# Patient Record
Sex: Female | Born: 1935 | Race: White | Hispanic: No | Smoking: Never smoker
Health system: Southern US, Community
[De-identification: ages and names within clinical notes are randomized; demographics above are authoritative.]

## PROBLEM LIST (undated history)

## (undated) DIAGNOSIS — I10 Essential (primary) hypertension: Secondary | ICD-10-CM

## (undated) DIAGNOSIS — I219 Acute myocardial infarction, unspecified: Secondary | ICD-10-CM

## (undated) DIAGNOSIS — I4891 Unspecified atrial fibrillation: Secondary | ICD-10-CM

## (undated) DIAGNOSIS — M1990 Unspecified osteoarthritis, unspecified site: Secondary | ICD-10-CM

## (undated) DIAGNOSIS — F32A Depression, unspecified: Secondary | ICD-10-CM

## (undated) DIAGNOSIS — F329 Major depressive disorder, single episode, unspecified: Secondary | ICD-10-CM

## (undated) DIAGNOSIS — E039 Hypothyroidism, unspecified: Secondary | ICD-10-CM

## (undated) DIAGNOSIS — F419 Anxiety disorder, unspecified: Secondary | ICD-10-CM

## (undated) DIAGNOSIS — R251 Tremor, unspecified: Secondary | ICD-10-CM

## (undated) DIAGNOSIS — F0390 Unspecified dementia without behavioral disturbance: Secondary | ICD-10-CM

## (undated) DIAGNOSIS — J189 Pneumonia, unspecified organism: Secondary | ICD-10-CM

## (undated) DIAGNOSIS — D649 Anemia, unspecified: Secondary | ICD-10-CM

## (undated) DIAGNOSIS — S72002A Fracture of unspecified part of neck of left femur, initial encounter for closed fracture: Secondary | ICD-10-CM

## (undated) HISTORY — DX: Tremor, unspecified: R25.1

## (undated) HISTORY — DX: Unspecified dementia, unspecified severity, without behavioral disturbance, psychotic disturbance, mood disturbance, and anxiety: F03.90

## (undated) HISTORY — PX: ROTATOR CUFF REPAIR: SHX139

## (undated) HISTORY — PX: TOTAL HIP ARTHROPLASTY: SHX124

## (undated) HISTORY — DX: Essential (primary) hypertension: I10

## (undated) HISTORY — PX: CATARACT EXTRACTION, BILATERAL: SHX1313

## (undated) HISTORY — PX: CATARACT EXTRACTION: SUR2

## (undated) HISTORY — PX: OTHER SURGICAL HISTORY: SHX169

## (undated) HISTORY — DX: Anemia, unspecified: D64.9

## (undated) HISTORY — DX: Unspecified atrial fibrillation: I48.91

## (undated) HISTORY — DX: Anxiety disorder, unspecified: F41.9

## (undated) HISTORY — DX: Hypothyroidism, unspecified: E03.9

## (undated) HISTORY — PX: CLOSED REDUCTION HUMERUS FRACTURE: SHX985

## (undated) HISTORY — PX: APPENDECTOMY: SHX54

---

## 1998-03-07 ENCOUNTER — Other Ambulatory Visit: Admission: RE | Admit: 1998-03-07 | Discharge: 1998-03-07 | Payer: Self-pay | Admitting: Internal Medicine

## 1999-01-27 ENCOUNTER — Ambulatory Visit (HOSPITAL_COMMUNITY): Admission: RE | Admit: 1999-01-27 | Discharge: 1999-01-27 | Payer: Self-pay | Admitting: Neurosurgery

## 1999-01-27 ENCOUNTER — Encounter: Payer: Self-pay | Admitting: Neurosurgery

## 1999-01-30 ENCOUNTER — Encounter: Payer: Self-pay | Admitting: Neurosurgery

## 1999-01-31 ENCOUNTER — Inpatient Hospital Stay (HOSPITAL_COMMUNITY): Admission: RE | Admit: 1999-01-31 | Discharge: 1999-02-07 | Payer: Self-pay | Admitting: Neurosurgery

## 1999-02-07 ENCOUNTER — Inpatient Hospital Stay (HOSPITAL_COMMUNITY)
Admission: RE | Admit: 1999-02-07 | Discharge: 1999-02-13 | Payer: Self-pay | Admitting: Physical Medicine and Rehabilitation

## 1999-02-15 ENCOUNTER — Encounter: Admission: RE | Admit: 1999-02-15 | Discharge: 1999-05-16 | Payer: Self-pay

## 1999-06-19 ENCOUNTER — Other Ambulatory Visit: Admission: RE | Admit: 1999-06-19 | Discharge: 1999-06-19 | Payer: Self-pay | Admitting: Internal Medicine

## 2000-05-31 ENCOUNTER — Encounter: Payer: Self-pay | Admitting: Internal Medicine

## 2000-05-31 ENCOUNTER — Encounter: Admission: RE | Admit: 2000-05-31 | Discharge: 2000-05-31 | Payer: Self-pay | Admitting: Internal Medicine

## 2001-04-10 ENCOUNTER — Other Ambulatory Visit: Admission: RE | Admit: 2001-04-10 | Discharge: 2001-04-10 | Payer: Self-pay | Admitting: Internal Medicine

## 2001-04-22 ENCOUNTER — Encounter: Payer: Self-pay | Admitting: Internal Medicine

## 2001-04-22 ENCOUNTER — Encounter: Admission: RE | Admit: 2001-04-22 | Discharge: 2001-04-22 | Payer: Self-pay | Admitting: Internal Medicine

## 2001-04-29 ENCOUNTER — Encounter: Admission: RE | Admit: 2001-04-29 | Discharge: 2001-04-29 | Payer: Self-pay | Admitting: Internal Medicine

## 2001-04-29 ENCOUNTER — Encounter: Payer: Self-pay | Admitting: Internal Medicine

## 2001-06-02 ENCOUNTER — Encounter: Admission: RE | Admit: 2001-06-02 | Discharge: 2001-06-02 | Payer: Self-pay | Admitting: Internal Medicine

## 2001-06-02 ENCOUNTER — Encounter: Payer: Self-pay | Admitting: Internal Medicine

## 2002-05-28 ENCOUNTER — Encounter: Admission: RE | Admit: 2002-05-28 | Discharge: 2002-05-28 | Payer: Self-pay | Admitting: Internal Medicine

## 2002-05-28 ENCOUNTER — Encounter: Payer: Self-pay | Admitting: Internal Medicine

## 2003-06-15 ENCOUNTER — Encounter: Payer: Self-pay | Admitting: Internal Medicine

## 2003-06-15 ENCOUNTER — Encounter: Admission: RE | Admit: 2003-06-15 | Discharge: 2003-06-15 | Payer: Self-pay | Admitting: Internal Medicine

## 2004-06-20 ENCOUNTER — Encounter: Admission: RE | Admit: 2004-06-20 | Discharge: 2004-06-20 | Payer: Self-pay | Admitting: Internal Medicine

## 2005-01-22 ENCOUNTER — Inpatient Hospital Stay (HOSPITAL_COMMUNITY): Admission: EM | Admit: 2005-01-22 | Discharge: 2005-02-01 | Payer: Self-pay | Admitting: Emergency Medicine

## 2005-01-23 ENCOUNTER — Encounter (INDEPENDENT_AMBULATORY_CARE_PROVIDER_SITE_OTHER): Payer: Self-pay | Admitting: *Deleted

## 2005-07-23 ENCOUNTER — Encounter: Admission: RE | Admit: 2005-07-23 | Discharge: 2005-07-23 | Payer: Self-pay | Admitting: Internal Medicine

## 2005-08-04 ENCOUNTER — Encounter: Admission: RE | Admit: 2005-08-04 | Discharge: 2005-08-04 | Payer: Self-pay | Admitting: Internal Medicine

## 2005-10-26 ENCOUNTER — Encounter: Admission: RE | Admit: 2005-10-26 | Discharge: 2005-10-26 | Payer: Self-pay | Admitting: Internal Medicine

## 2005-11-08 ENCOUNTER — Encounter: Admission: RE | Admit: 2005-11-08 | Discharge: 2005-11-08 | Payer: Self-pay | Admitting: Internal Medicine

## 2005-11-22 ENCOUNTER — Encounter: Admission: RE | Admit: 2005-11-22 | Discharge: 2005-11-22 | Payer: Self-pay | Admitting: Internal Medicine

## 2005-12-05 ENCOUNTER — Encounter: Admission: RE | Admit: 2005-12-05 | Discharge: 2005-12-05 | Payer: Self-pay | Admitting: Internal Medicine

## 2006-06-04 ENCOUNTER — Encounter: Admission: RE | Admit: 2006-06-04 | Discharge: 2006-06-04 | Payer: Self-pay | Admitting: Internal Medicine

## 2006-06-25 ENCOUNTER — Encounter: Admission: RE | Admit: 2006-06-25 | Discharge: 2006-06-25 | Payer: Self-pay | Admitting: Internal Medicine

## 2006-07-18 ENCOUNTER — Encounter: Admission: RE | Admit: 2006-07-18 | Discharge: 2006-07-18 | Payer: Self-pay | Admitting: Internal Medicine

## 2006-09-02 ENCOUNTER — Encounter: Admission: RE | Admit: 2006-09-02 | Discharge: 2006-09-02 | Payer: Self-pay | Admitting: Internal Medicine

## 2006-11-17 ENCOUNTER — Inpatient Hospital Stay (HOSPITAL_COMMUNITY): Admission: EM | Admit: 2006-11-17 | Discharge: 2006-11-21 | Payer: Self-pay | Admitting: Emergency Medicine

## 2006-11-18 ENCOUNTER — Ambulatory Visit: Payer: Self-pay | Admitting: Physical Medicine & Rehabilitation

## 2006-12-02 ENCOUNTER — Encounter: Admission: RE | Admit: 2006-12-02 | Discharge: 2006-12-02 | Payer: Self-pay | Admitting: Orthopedic Surgery

## 2007-01-27 ENCOUNTER — Encounter: Admission: RE | Admit: 2007-01-27 | Discharge: 2007-01-27 | Payer: Self-pay | Admitting: Orthopedic Surgery

## 2007-09-26 IMAGING — US US EXTREM LOW VENOUS*L*
1 series · 14 of 24 positions shown · non-contrast
Comparison: None.

CLINICAL DATA: Status post left hip fracture with left lower extremity pain and swelling.
 LEFT LOWER EXTREMITY VENOUS DOPPLER ULTRASOUND:
TECHNIQUE: Gray scale sonography with compression, as well as color and duplex Doppler ultrasound, were performed to evaluate the deep venous system from the level of the common femoral vein through the popliteal and proximal calf veins.

[Series 1: unknown · 14 of 28 slices shown]
[im 1/28]
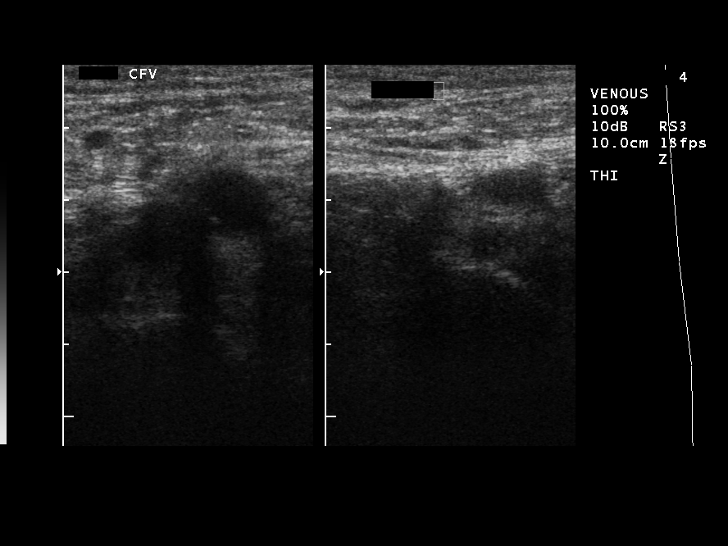
[im 3/28]
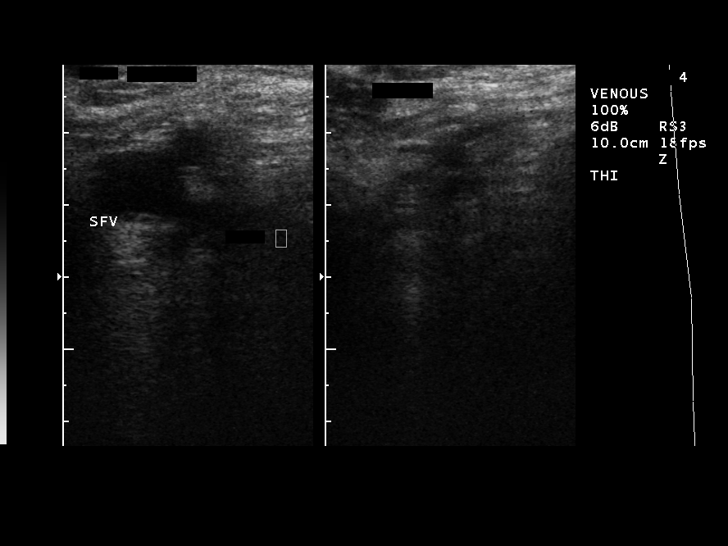
[im 5/28]
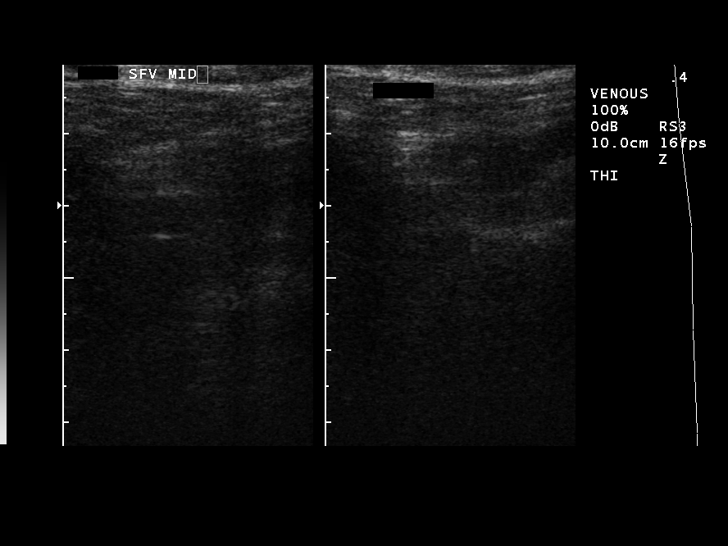
[im 8/28]
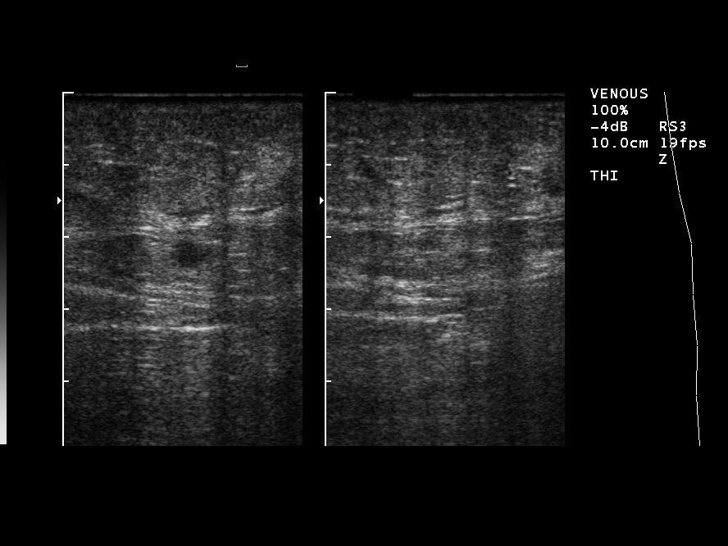
[im 9/28]
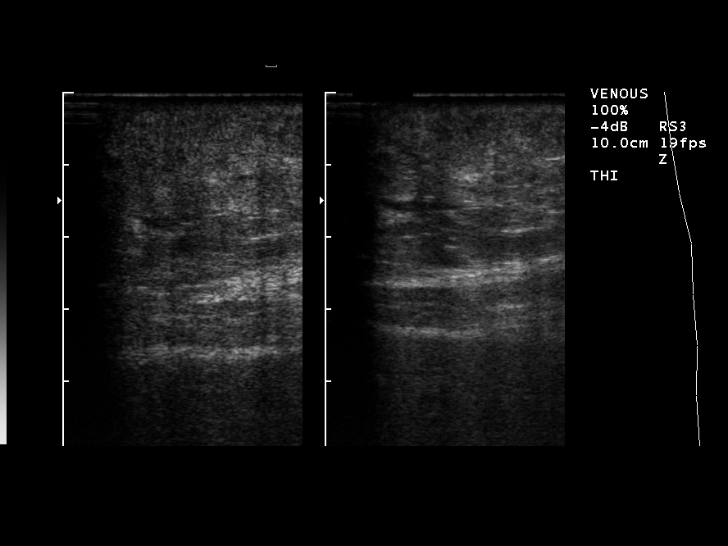
[im 11/28]
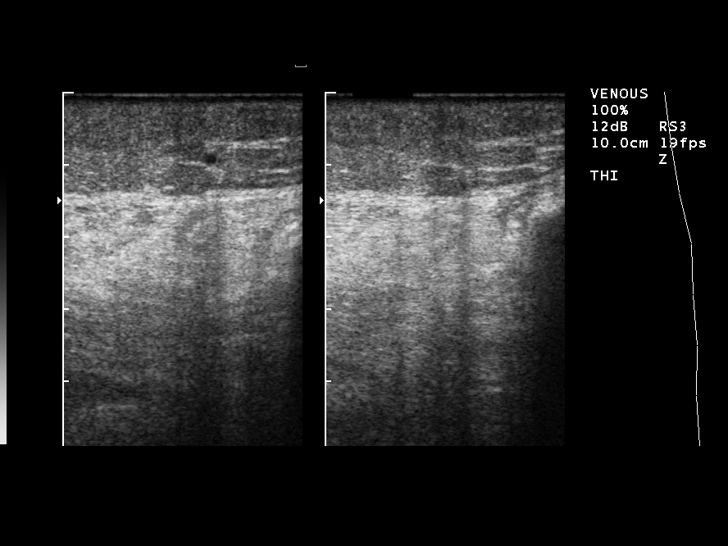
[im 13/28]
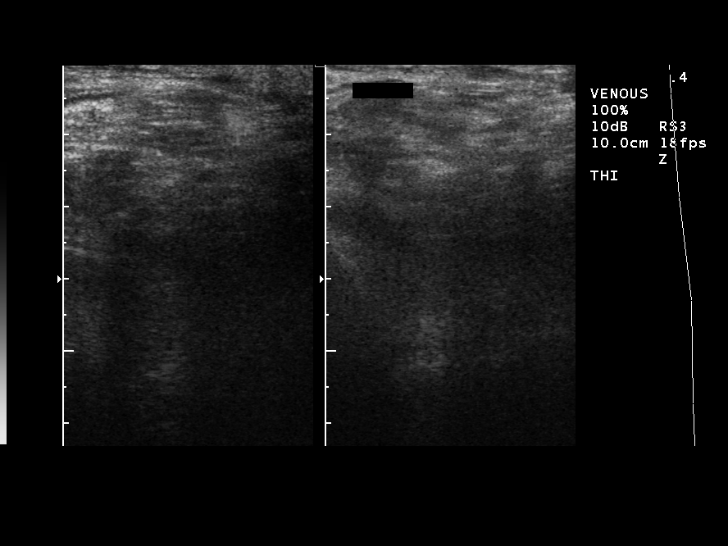
[im 15/28]
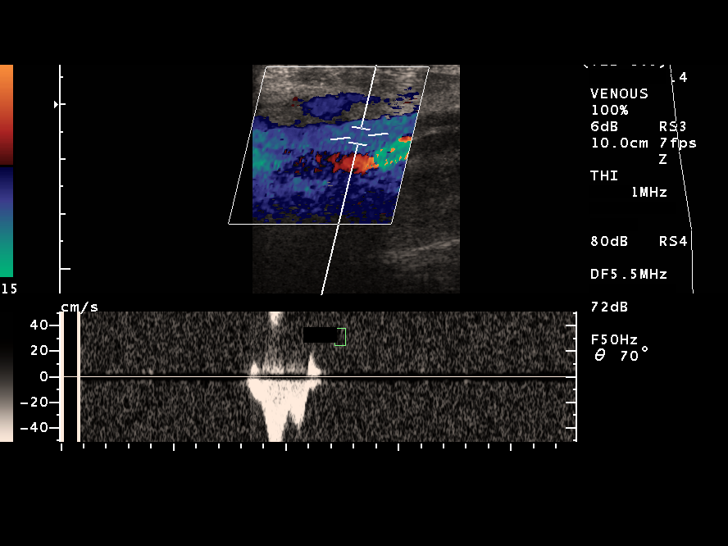
[im 17/28]
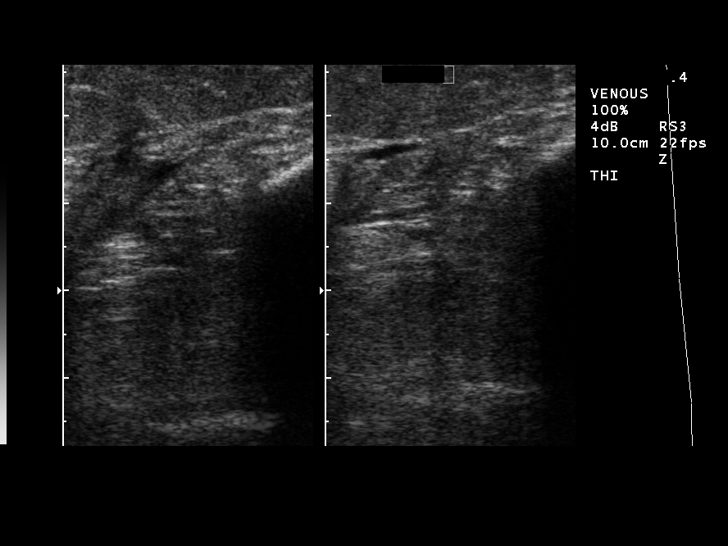
[im 19/28]
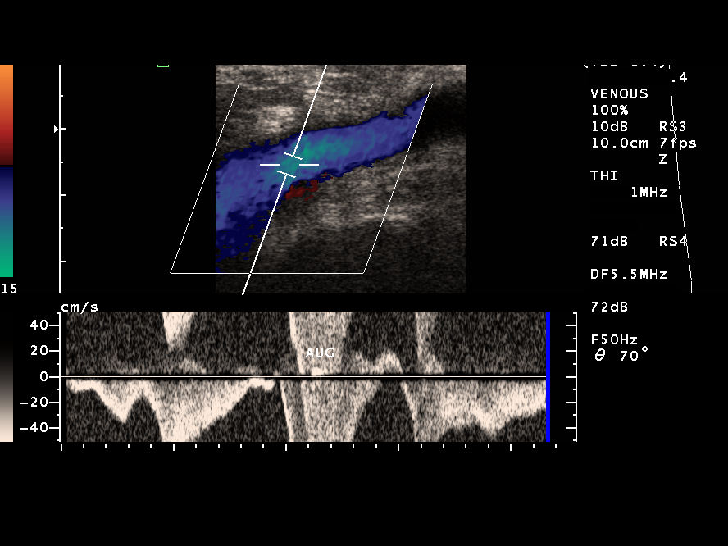
[im 22/28]
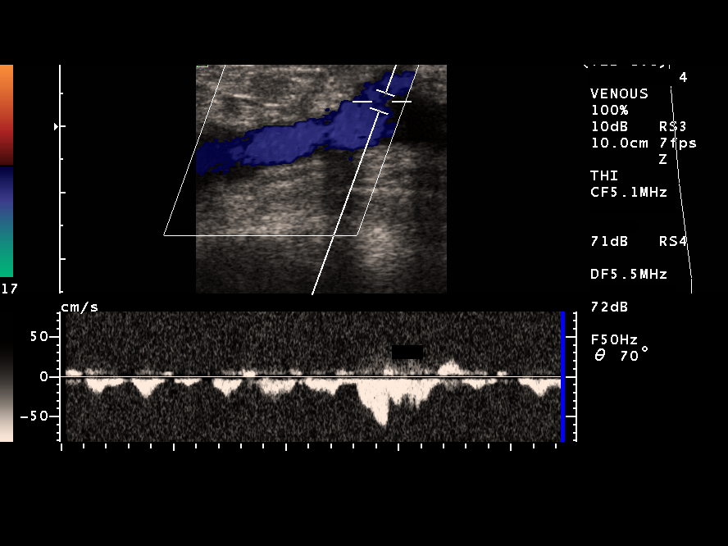
[im 23/28]
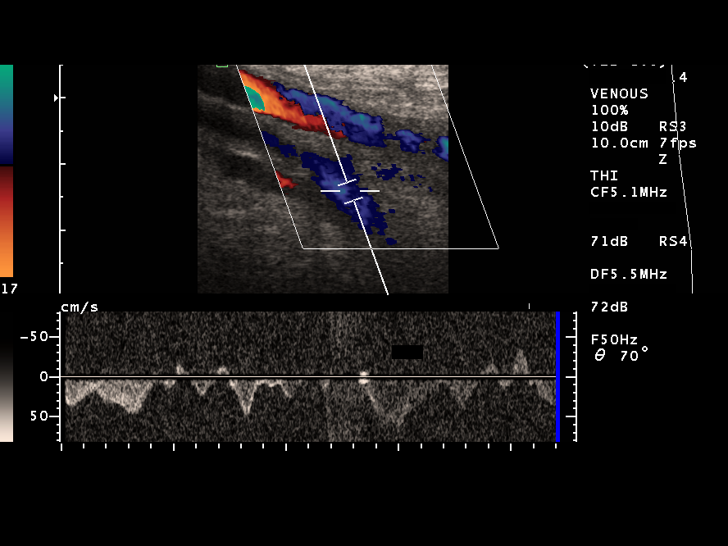
[im 25/28]
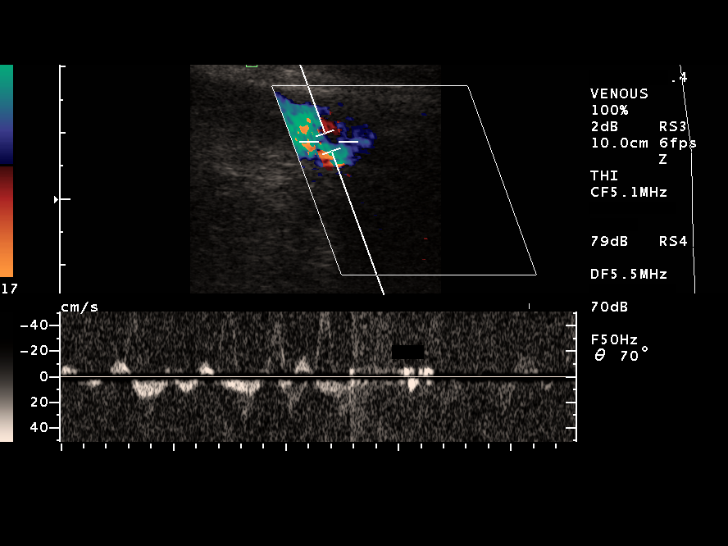
[im 28/28]
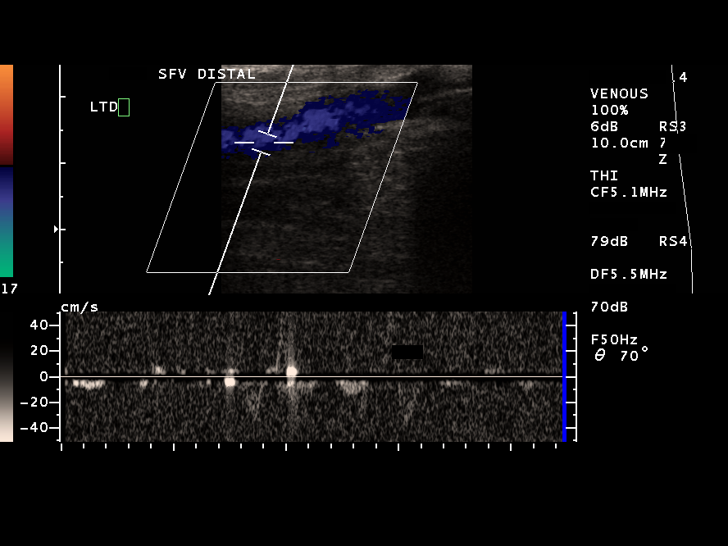

[14 of 24 positions shown; findings below may reference images not displayed]

FINDINGS: Complete compressibility is demonstrated throughout the visualized deep veins.  No venous filling defects are identified by gray scale or color Doppler sonography.  Doppler waveforms show normal direction of venous flow, with normal phasicity and response to augmentation.
 Portions of the exam were somewhat limited due to soft tissue edema.  Primary limitations involve the distal superficial femoral vein.
IMPRESSION: Negative.  No evidence of deep venous thrombosis.

## 2010-06-14 ENCOUNTER — Encounter: Admission: RE | Admit: 2010-06-14 | Discharge: 2010-06-14 | Payer: Self-pay | Admitting: Internal Medicine

## 2010-06-21 ENCOUNTER — Encounter: Admission: RE | Admit: 2010-06-21 | Discharge: 2010-06-21 | Payer: Self-pay | Admitting: Internal Medicine

## 2010-07-14 ENCOUNTER — Ambulatory Visit: Payer: Self-pay | Admitting: Cardiology

## 2010-07-27 ENCOUNTER — Ambulatory Visit: Payer: Self-pay | Admitting: Cardiology

## 2010-08-15 ENCOUNTER — Ambulatory Visit: Payer: Self-pay | Admitting: Cardiology

## 2010-09-14 ENCOUNTER — Ambulatory Visit: Payer: Self-pay | Admitting: Cardiology

## 2010-09-22 ENCOUNTER — Encounter: Admission: RE | Admit: 2010-09-22 | Discharge: 2010-09-22 | Payer: Self-pay | Admitting: Internal Medicine

## 2010-10-13 ENCOUNTER — Ambulatory Visit: Payer: Self-pay | Admitting: Cardiology

## 2010-10-16 ENCOUNTER — Ambulatory Visit: Payer: Self-pay | Admitting: Cardiovascular Disease

## 2010-11-14 ENCOUNTER — Ambulatory Visit: Payer: Self-pay | Admitting: Cardiology

## 2011-03-27 ENCOUNTER — Other Ambulatory Visit: Payer: Self-pay | Admitting: *Deleted

## 2011-03-27 DIAGNOSIS — R609 Edema, unspecified: Secondary | ICD-10-CM

## 2011-03-27 MED ORDER — FUROSEMIDE 20 MG PO TABS: ORAL_TABLET | ORAL | Status: DC

## 2011-04-13 NOTE — Discharge Summary (Signed)
[name], [name] NO.:  [id number]   MEDICAL RECORD NO.:  [id number]          PATIENT TYPE:  INP   LOCATION:  1410                         FACILITY:  [facility]   PHYSICIAN:  [name], D.O.    DATE OF BIRTH:  07-Jan-1922   DATE OF ADMISSION:  11/16/2006  DATE OF DISCHARGE:                               DISCHARGE SUMMARY   TRANSFER SUMMARY:  Date of transfer currently pending skilled nursing  facility bed availability.   PRIMARY CARE PHYSICIAN:  [name]. [name], M.D.   CARDIOLOGIST:  [name], M.D.   PRINCIPAL DIAGNOSES:  1. Status post hip fracture.  2. Anemia secondary to acute blood loss, status post anemia evaluation      this admission revealing no evidence of acute gastrointestinal      bleeding.  Her iron saturation was 8%, suggestive of iron      deficiency.  Her B12 was on the borderline.  3. History of atrial fibrillation though currently sinus rhythm,      status post direct current cardioversion in the past.  4. Thrombocytopenia, currently a platelet count of 99.  It has      remained stable during this hospitalization.  Would recommend      continuing to follow this in the outpatient setting.  Her B12 is      borderline low at 47 and will replete x1.  5. Coagulopathy.  She has required very little to achieve actually a      supratherapeutic INR.  After only receiving 5 mg on December 22 and      3 mg on December 23, her INR climbed to 3.9.  It is down to 3.0      today.  Pharmacy is adjusting her dose to 0.5 mg, which will await      final recommendations for Coumadin dosing prior to discharge.   DISCHARGE MEDICATIONS:  1. [name] should continue her atenolol at 25 mg daily as she was      on prior to admission.  2. Coumadin as per pharmacy protocol.  3. Calcium carbonate one tablet p.o. b.i.d.  4. Vitamin B12 100 mcg p.o. daily.  5. Colace 100 mg p.o. b.i.d., hold if she develops diarrhea.  6. Tylenol 650 mg q.4h. p.r.n.  pain and discomfort.  7. Oxycodone 5 mg p.o. q.6h. p.r.n. pain or discomfort.  8. Fleet's p.r.n.   DISPOSITION:  At present we are awaiting skilled nursing facility short-  term placement for case management arrangements.  Orthopedic surgery has  recommended touchdown weightbearing and to follow up with Dr. Carola Carr in  one week from today, November 20, 2006.   HISTORY OF PRESENTING ILLNESS:  For full details please refer to the H&P  as dictated by [name].  However, briefly, [name] is a very  pleasant 77 year old female patient of Dr. Lendell Carr, who presented after  she lost her balance and fell while folding laundry.  She had left hip  pain and x-rays in the emergency department revealed displacement of her  left hip with fracture, and she was seen by  orthopedic surgery and  scheduled for operative repair.   Carr COURSE:  [name] Carr course was uneventful.  She  underwent intramedullary nail placement on November 16, 2006.  She was  felt to have sustained acute blood loss, 300 mL of estimated blood loss,  and subsequently underwent a blood transfusion to a hemoglobin of 10.4.  She underwent anemia profile that revealed a low iron saturation with a  borderline low B12 and low folate levels.  She was replaced in reference  to her B12 as well as iron and underwent transfusion.  Her clinical  course has been that of improvement.  She has remained constipation;  however, she has exhibited no evidence of bloody stools.  Her hemoglobin  should be followed in the outpatient setting.   LABORATORY DATA AT TIME OF THIS DICTATION:  Sodium 134, potassium 3, BUN  10, creatinine 0.6.  WBC 6.3, hemoglobin 10.4, platelet count 99.      [name], D.O.  Electronically Signed     ESS/MEDQ  D:  11/20/2006  T:  11/20/2006  Job:  [id number]   cc:   [name]. [name]., M.D.  Fax: [phone]   [name]., M.D.  Fax: [phone]

## 2011-04-13 NOTE — Consult Note (Signed)
NAMENYRA, ANSPAUGH NO.:  [id number]   MEDICAL RECORD NO.:  [id number]          PATIENT TYPE:  EMS   LOCATION:  ED                           FACILITY:  [facility]   PHYSICIAN:  Doralee Albino. Carola Frost, M.D. DATE OF BIRTH:  1922-11-15   DATE OF CONSULTATION:  11/16/2006  DATE OF DISCHARGE:                                 CONSULTATION   REQUESTING PHYSICIAN:  Dr. Linwood Dibbles.   REASON FOR CONSULTATION:  Left hip pain and deformity.   BRIEF HISTORY OF PRESENTATION:  [name] is an 77 year old  white female who sustained a ground-level fall after getting dizzy  today.  She denies loss of consciousness, denies other injury or focal  pain.  After her fall, she noted left hip shortening and deformity and  was unable to bear weight.   PAST MEDICAL HISTORY:  Notable for atrial fibrillation status post  cardioversion back in March 2006 with sinus rhythm since that time.   FAMILY HISTORY:  Noncontributory.  No family history of coronary  disease, diabetes or anesthetic reactions.   SOCIAL HISTORY:  The patient does not smoke, does not drink alcohol and  lives alone independently.  She uses a cane occasionally.   DRUG ALLERGIES:  CODEINE which causes dizziness and MORPHINE which  causes of hyperactivity.  Family requests we not use morphine.   CURRENT MEDICATIONS:  Potassium, atenolol and baby aspirin.   REVIEW OF SYSTEMS:  Noncontributory as the patient denies recent GI or  GU complaints.  Other review of systems as noted above.   PHYSICAL EXAMINATION:  GENERAL:  She appears quite comfortable,  appropriate for stated age and not in any acute distress.  HEART:  Regular rate and rhythm.  LUNGS:  Clear bilaterally.  ABDOMEN:  Soft, nontender.  EXTREMITIES:  Notable for shortening of the left lower extremity with  tenderness and pain about the left hip.  There is no knee tenderness nor  swelling.  There is external rotation deformity.  Deep peroneal,  superficial  peroneal and tibial nerve sensory and motor function is  intact. Dorsalis pedis pulses 2+ and posterior tibial pulse is 1+.   X-RAYS:  AP and lateral hip films show a displaced shortened left  subtrochanteric hip fracture with extension well below the lesser  trochanter down into the shaft.   DATA:  EKG, chest x-ray, CBC and coags were reviewed along with her  chemistries.  These did not identify any significant abnormalities. Her  urinalysis was negative for infection.   IMPRESSION:  1. Left hip fracture.  2. History of arrhythmia and cardioversion.   PLAN:  Intramedullary nailing of the left hip later this evening.  The  patient has already been evaluated by the [facility] team with Dr.  Leanord Hawking and he plans to admit the patient.  We did discuss postoperative  management and he felt that a 24-hour stay in a monitored bed may be  beneficial and that if all was well after that time period that she  could be transferred to the orthopedic floor for rehabilitation.   I did discuss in detail  with the patient and her family the risks and  benefits of surgery as well as the intended procedure.  They understand  the possibility of heart attack, stroke, infection, death, neurovascular  injury, malunion, nonunion and need for further surgery including  revision, DVT, PE and others and they wish to proceed.      Doralee Albino. Carola Frost, M.D.  Electronically Signed     MHH/MEDQ  D:  11/16/2006  T:  11/18/2006  Job:  [id number]

## 2011-04-13 NOTE — Op Note (Signed)
NAMEDEMETRIS, MEINHARDT NO.:  [id number]   MEDICAL RECORD NO.:  [id number]          PATIENT TYPE:  OBV   LOCATION:  0104                         FACILITY:  [facility]   PHYSICIAN:  Doralee Albino. Carola Frost, M.D. DATE OF BIRTH:  1922/08/26   DATE OF PROCEDURE:  11/16/2006  DATE OF DISCHARGE:                               OPERATIVE REPORT   PREOPERATIVE DIAGNOSIS:  Left hip four-part intertrochanteric  subtrochanteric hip fracture.   POSTOPERATIVE DIAGNOSIS:  Left hip four-part intertrochanteric  subtrochanteric hip fracture.   PROCEDURE:  Intramedullary nailing of the left proximal femur using a  Gamma nail 125 degrees 380 mm statically locked.   SURGEON:  Myrene Galas, M.D.   ASSISTANT:  None.   ANESTHESIA:  General.   COMPLICATIONS:  None.   SPECIMENS:  None.   ESTIMATED BLOOD LOSS:  300 mL of reamings and fracture hematoma.   COMPLICATIONS:  None.   DISPOSITION:  PACU, condition stable.   BRIEF SUMMARY OF INDICATIONS FOR PROCEDURE:  [name] is an 77-year-  old female who sustained a left hip fracture in a ground-level fall.  We  discussed preoperatively risks and benefits of surgery including  possibility of infection, nerve injury, vessel injury, blood loss  requiring transfusion, malunion, nonunion, DVT, PE, heart attack, stroke  and other risks.  After full discussion she wished to proceed as did her  family.   DESCRIPTION OF PROCEDURE:  Ms. Knebel was taken to operating room  where general anesthesia was induced.  She was then transferred to the  fracture table where a reduction maneuver was performed after padding  all prominences.  We then made a 2.5 cm incision proximal to the greater  trochanter going down and attempting to start in the appropriate  position just off the greater trochanter.  Despite numerous attempts we  were unable to keep the guide wire and instrumentation from slipping  into the fracture site.  We did pass the guide wire  across the fracture  site after we had obtained a reduction which was done actually prior to  the prep and drape and make sure that we obtained a lateral to confirm  that the guide wire was as posterior as possible in the distal femur as  the radius curvature is often a concern with the nail tending to  translate anteriorly and which can lead to fracture of  the distal  femur.  We then sequentially reamed while holding the reaming channel  such that we did not destroy the lateral cortex or over-ream it.  We  then sunk the nail and used the nail within the femur to translate the  femoral shaft anteriorly and also to abduct it such that we were able to  get the appropriate trajectory of the guide pin into the center-center  position of the femoral head.  This was considerably difficult but we  were able to achieve satisfactory position and excellent overall  alignment with the exception of distraction at the fracture site which  was used to obtain an alignment.  The lag screw was then drilled and  inserted.  While watching on the lateral we could see the head did tend  to rotate just slightly with the lag screw.  Consequently the lag screw  was inserted a quarter turn too much and then backed up so that the neck  keyed in anatomically with regard to rotation.  We then released the  traction and engaged the compression device of the lag screw.  We were  able to watch on the lateral as this resulted in what appeared to be an  anatomic reduction with good compression at the fracture site.  A 90 mm  lag screw was placed.  We then placed one distal lock using a perfect  circle technique.  We then obtained of full length AP and lateral images  of the fracture site hardware and locking bolt.  This demonstrated  excellent reduction of the fracture and acceptable placement of  hardware.  Wounds were copiously irrigated and closed in standard  layered fashion using a #1 Vicryl for the deepest layer at  the hip, 2-0  for the subcu and staples for the skin.  Sterile gently compressive  dressing was applied.  The patient was awakened from anesthesia and  transported to PACU in stable condition.   PROGNOSIS:  Ms. Collyer has an axially unstable fracture of the  proximal femur and as such will not be able to progress with full  weightbearing.  Instead she will be touchdown weightbearing on the left  lower extremity for the next six weeks or so.  She will be on Coumadin  for DVT prophylaxis.  For next 24 hours she will be on a monitored bed  and if stable will be transferred to the orthopedic floor.  She will  continue on the primary medical service and we have asked both physical  and occupational therapy to assist [place] with her rehabilitation and also  will seek a rehab consult as the patient was living alone at the time of  injury.      Doralee Albino. Carola Frost, M.D.  Electronically Signed     MHH/MEDQ  D:  11/16/2006  T:  11/18/2006  Job:  [id number]

## 2011-04-13 NOTE — H&P (Signed)
NAMEBRANDILEE, PIES NO.:  [id number]   MEDICAL RECORD NO.:  [id number]          PATIENT TYPE:  INP   LOCATION:  1829                         FACILITY:  [facility]   PHYSICIAN:  Elmore Guise., M.D.DATE OF BIRTH:  07-10-1922   DATE OF ADMISSION:  01/22/2005  DATE OF DISCHARGE:                                HISTORY & PHYSICAL   REASON FOR ADMISSION:  Atrial fibrillation with rapid ventricular response.   PRIMARY CARE PHYSICIAN:  Dr. Lendell Caprice of [facility]   HISTORY OF PRESENT ILLNESS:  Patient is a very pleasant 77 year old white  female with past medical history of hypertension who presented to the ER for  evaluation of atrial fibrillation with RVR.  Patient in normal state of  health until approximately two weeks ago.  Since that time she reports two  falls, one where she hit her head on the table, a second fall where she  started having dizziness and fell on her left hip.  She reports that she has  just had a cold with malaise, nonproductive cough, as well as sore throat  and fever blister break-out on her lips.  She has recently started a muscle  relaxer as well as pain medicines to help with back and neck pain after her  first fall.  She stated that her falls are associated with mild dizziness,  however, no true loss of consciousness.  She denies any palpitations or  chest pain.  She does report mild pain on deep inspiration.  She has had  decreased p.o. intake for the past week only taking fluids in for the last  couple of days.  She denies any orthopnea, PND.  Does report off and on  lower extremity edema.  Her weight has been stable.  On arrival to the ER  patient was in atrial fibrillation with heart rate in the 170-180 range.  Her initial blood pressure was 80s/40s.  Patient was given a 500 mL normal  saline bolus with improvement in her blood pressure.  She was placed on  amiodarone drip with significant improvement in heart rate and blood  pressure.  Blood pressure now 110-120 range.   REVIEW OF SYSTEMS:  Otherwise negative.   CURRENT MEDICATIONS:  1.  Atenolol.  2.  K-Dur 20 mEq daily.   ALLERGIES:  None.   FAMILY HISTORY:  Positive for coronary disease in her brother and  hypertension in her other siblings.   SOCIAL HISTORY:  She is married.  No tobacco or alcohol (her husband is  currently in the hospital has been so since December 1).   PHYSICAL EXAMINATION:  VITAL SIGNS:  She is afebrile.  Blood pressure  114/60, pulse irregular irregular in the 120-130 range.  She is saturating  95% on room air.  GENERAL:  She is a very pleasant, elderly white female alert and oriented  x4, in no significant distress.  HEENT:  She does have an ecchymosis and small hematoma noted of the left  frontal area with ecchymosis extending to her medial portion of her left  eye.  No visual defect, though.  Oral mucosa is dry with herpetic lesions  noted on upper and lower lips.  LUNGS:  Clear.  HEART:  Irregularly irregular with a soft 2/6 systolic ejection murmur.  ABDOMEN:  Soft, nontender, nondistended.  EXTREMITIES:  Warm with 2+ pulses and no edema.  She does have a small  bruise noted on her left hip area.   EKG shows atrial fibrillation, rate of 159 per minute with nonspecific ST-T  wave changes.  Chest x-ray shows mild cardiomegaly with no significant  edema.  Left hip and pelvis films show no fracture.   LABORATORY VALUES:  Sodium 135, potassium 4.0, BUN 31, creatinine 0.9.  Hemoglobin 15.3.  Cardiac markers are pending.  TSH is pending.   IMPRESSION:  1.  Atrial fibrillation with rapid ventricular response of unknown duration.  2.  Multiple falls.  3.  Dehydration.  4.  Question whether her falls are secondary to combination of her atrial      fibrillation and her dehydration or secondary to her muscle relaxant and      newly added pain medication.   PLAN:  Will admit to telemetry bed.  Serial cardiac enzymes as well  as  checking her thyroid function.  Will continue her amiodarone drip secondary  to patient's low blood pressure on arrival.  Will give her a digoxin load.  She has normal renal function.  Continue to hydrate with normal saline 125  mL/hour.  Agree with continuing low dose beta blocker at 25 mg b.i.d.  Will  check routine surface echocardiogram to evaluate for LV chamber sizes as  well as LV function.  If heart rate continues to show no improvement,  discussed possibility of TEE cardioversion.  If she does become  hemodynamically unstable despite medical treatment, I did discuss urgent  cardioversion with both patient and family.  Further measures pending her  laboratory work.  Will start her on aspirin for anticoagulation at this  time.      TWK/MEDQ  D:  01/22/2005  T:  01/22/2005  Job:  [id number]

## 2011-04-13 NOTE — H&P (Signed)
[name], [name] NO.:  [id number]   MEDICAL RECORD NO.:  [id number]          PATIENT TYPE:  INP   LOCATION:  1410                         FACILITY:  [facility]   PHYSICIAN:  Maxwell Caul, M.D.DATE OF BIRTH:  1922/01/25   DATE OF ADMISSION:  11/16/2006  DATE OF DISCHARGE:                              HISTORY & PHYSICAL   IDENTIFICATION:  This is an 77 year old woman from home who is a primary  care patient of Dr. Higinio Carr followed in cardiology by Dr. Reyes Carr.   CHIEF COMPLAINT:  Fall at home suffering a left hip fracture.   HISTORY OF PRESENT ILLNESS:  This patient lost her balance and fell  today while folding laundry.  She noted immediate left hip pain, brought  to the emergency room.  X-ray shows displacement of trochanteric  proximal femur fracture.  She denies any history of syncope or chest  pain or palpitations surrounding this event.  She is planned for the OR  this afternoon.   PAST MEDICAL HISTORY:  1. Admitted to [facility] from February 7 through March 9 at      that time in atrial fibrillation with rapid ventricular response.      A TEE showed an EF of 50-55% without regional wall motion      abnormalities.  She required DC cardioversion.  She was discharged      on atenolol, amiodarone and Coumadin as she was maintained in      sinus.  I believe the amiodarone and Coumadin were since      discontinued.  She also during this admission had left lower lobe      pneumonia sick euthyroid picture.  2. Declining balance with falls:  She has had no falls in the last 3      months.  She uses a straight cane; however, I believe a walker has      been suggested to her in the past.  She has had 2 MRIs of the brain-      one in 2006 and one in 2007 most recently in October 2007 that      showed mild white matter changes.  She has had a prior occipital      craniotomy I believe for trigeminal neuralgia on the right.  This      was 10 years  ago.  3. Multiple epidural injections for lumbar spinal stenosis.  4. Craniotomy 10 years ago by her description for trigeminal      neuralgia.  5. I have been told in the past she has osteopenia with a history of      pelvic fracture some years ago.  This actually may fit the better      definition of osteoporosis.  6. Hypertension.   CURRENT MEDICATIONS:  1. Atenolol 25 daily.  2. Enteric-coated ASA 81 daily.  3. KCl unknown dose.   REVIEW OF SYSTEMS:  HEENT:  She has no headache.  RESPIRATORY:  No  exertional shortness of breath.  She is still active, still drives, does  not seem limited at all.  CARDIOVASCULAR:  No palpitations, no chest  pain, no suggestion of exertional chest discomfort.  Saw cardiology 3  months ago without any problems.   SOCIAL:  The patient is at home, uses a straight cane, still driving,  independent.   EXAMINATION:  Pulse is 62 when I saw her.  She is alert and responsive.  RESPIRATORY:  Clear air entry with no crackles or wheezes.  CARDIAC:  Heart sounds are normal.  There is no S4.  No carotid bruits  are noted.  No murmurs.  ABDOMEN:  Normal exam.  EXTREMITIES:  Pulses are intact.  She probably has mild venous stasis  but without any edema.  No evidence of a DVT.  CNS EXAM:  As near as I can tell is nonfocal at this point.  She has had  normal upper extremity strength, normal tone.  Reflexes in the upper  extremities are normal.  Both toes are downgoing.   LABORATORY:  Her EKG shows nonspecific T-wave changes.  Chest x-ray  shows no acute.  Her hemoglobin is 13.2, white count 4.13.  PT and PTT  are normal.  Her basic metabolic panel shows a potassium of 4.7, BUN of  15, creatinine 1.6.  Her glucose is 152.  Her urinalysis is clear.   IMPRESSION:  1. Left hip fracture for the operating room this afternoon:  She is an      elderly woman with hypertension.  However, otherwise there is no      suggestion of an unstable coronary syndrome which  would preclude      necessary surgery for this elderly woman.  2. History of atrial fibrillation:  She is currently sinus in the low      60s.  There is no suggestion of coronary artery disease.  She is      already on a beta blocker.  I will not add any further beta      blockade due to her low heart rate.  The TEE in 2006 showed no      valve problems, no regional wall abnormalities and a low normal      ejection fraction.  3. Gait ataxia with falls:  Apparently this has been thoroughly worked      up and the cause is not clear.  A walker has been suggested to her      in the past but she refuses.  4. Lumbar spinal stenosis having received prior epidural injections.  5. Osteopenia/osteoporosis:  This will likely need treatment.  6. History of sick euthyroid:  I will check a TSH and free T4.  7. Hypertension.   Overall impression is the patient is stable to proceed to surgery.  Her  risk is intermediate for coronary events; however, there is nothing  further to be done here and she should proceed with necessary surgery.  We will continue with beta blockers postoperative.  She will need  postoperative anticoagulation per protocol.  The risk of postoperative  delirium here is probably fairly high.           ______________________________  Maxwell Caul, M.D.     MGR/MEDQ  D:  11/16/2006  T:  11/18/2006  Job:  [id number]

## 2011-04-13 NOTE — Discharge Summary (Signed)
[name], [name] NO.:  [id number]   MEDICAL RECORD NO.:  [id number]          PATIENT TYPE:  INP   LOCATION:  2019                         FACILITY:  [facility]   PHYSICIAN:  Elmore Guise., M.D.DATE OF BIRTH:  July 26, 1922   DATE OF ADMISSION:  01/22/2005  DATE OF DISCHARGE:  02/01/2005                                 DISCHARGE SUMMARY   DISCHARGE DIAGNOSIS:  1.  Atrial fibrillation with rapid ventricular response status post DC      cardioversion with maintenance of normal sinus rhythm for the last eight      days.  2.  Left lower lobe pneumonia.  3.  Urinary tract infection.  4.  Dehydration, now resolved.  5.  Hypertension.  6.  Deconditioning.   HISTORY OF PRESENT ILLNESS:  The patient is a very pleasant 77 year old  white female with past medical history of hypertension who presented to Dr.  Lendell Caprice at [facility] after two falls over a one week period. On  arrival there, patient was found to have a tachy arrhythmia and be in new-  onset atrial fibrillation. She was then sent to the hospital for further  evaluation.   HOSPITAL COURSE:  On arrival to the hospital, the patient had borderline  blood pressures in the high 80s to low 90 range. She was started on  amiodarone drip as well as digoxin and IV load with some improvement and her  heart rate from the 160-170 range down to the 120 range. She underwent TEE  as well as DC cardioversion during her hospitalization. She tolerated these  procedures well. After her cardioversion, she had no further atrial  arrhythmias. She was maintained on amiodarone 200 mg daily as well as  atenolol and Coumadin. Her INR has now been therapeutic for the last week.  Prolonging her hospitalization, the patient was noted to have a left lower  lobe pneumonia. She was treated with azithromycin 500 mg daily for seven  days and this was decreased down to 250 mg for the next three days. She also  was found to have a  urinary tract infection and this was treated with Cipro  250 mg b.i.d. for three days. Her TSH was abnormal during her  hospitalization, however, her thyroid panel was consistent with a sick  euthyroid picture.  This will need to be followed up as an outpatient. The  patient has now been evaluated and performing physical therapy and  occupational therapy daily. Her strength is returning back to normal. She  states she is ready to go home with continued PT, OT and home health  evaluation for her. She will be discharged home on the following  medications.   DISCHARGE MEDICATIONS:  1.  Aspirin 81 mg daily.  2.  Amiodarone 200 mg daily.  3.  Coumadin 1 mg every other day to be again on February 04, 2005.  4.  Humibid LA 600 mg q.12h. p.r.n. cough.  5.  K-Dur 20 mEq p.o. daily  This is as before.  6.  Azithromycin 350 mg daily for the next three days.  7.  Atenolol 25 mg daily as before.   My goal at this time is to continue her Coumadin for treatment of four  weeks. Should she remain in sinus, at that time we will discontinue her  Coumadin therapy. However, we will continue amiodarone for a total of eight  weeks after hospitalization.  If she remains in normal sinus rhythm at that  time, we will also discontinue this treatment.   For pain, I have asked her to use Tylenol as needed.   Activity is as she can tolerate.  It was recommended that she have a rolling  walker for ambulation at home. This was arranged for her. She will also have  PT, OT and home health evaluations.   Her diet is as tolerated.   WOUND CARE:  She does have two small skin tears on her posterior thoracic  area which is presumed secondary to her cardioversion and removing of her  cardioversion pads.   The patient will follow-up with Dr. Reyes Ivan in [facility] in one  week for office visit and PT/INR. She will call the office at [phone] for  an appointment. Home health was arranged prior to her discharge.  She will  follow up with her primary care physician, Dr. Lendell Caprice, in approximately  four to six weeks.      TWK/MEDQ  D:  02/01/2005  T:  02/01/2005  Job:  [id number]   cc:   Dr. [facility]

## 2011-05-28 ENCOUNTER — Telehealth: Payer: Self-pay | Admitting: Cardiology

## 2011-05-28 NOTE — Telephone Encounter (Signed)
Wants to discuss who his mother's new provider is.Former Dr.Tennant patient.

## 2011-05-28 NOTE — Telephone Encounter (Signed)
Pt being sent to Dr Antoine Poche for cardiac follow-up. Left message for Chrissie Noa at [phone]. Left  [facility] phone number [phone]. Also left this office number for further questions. [phone]. Son Chrissie Noa returned call and informed of the above. Recall entered in to computer for Dr Antoine Poche.

## 2011-07-12 ENCOUNTER — Encounter: Payer: Self-pay | Admitting: *Deleted

## 2011-07-12 ENCOUNTER — Encounter: Payer: Self-pay | Admitting: Cardiology

## 2011-07-13 ENCOUNTER — Ambulatory Visit (INDEPENDENT_AMBULATORY_CARE_PROVIDER_SITE_OTHER): Payer: Medicare Other | Admitting: Cardiology

## 2011-07-13 ENCOUNTER — Encounter: Payer: Self-pay | Admitting: Cardiology

## 2011-07-13 VITALS — BP 98/62 | HR 77 | Ht 64.0 in | Wt 114.0 lb

## 2011-07-13 DIAGNOSIS — I4891 Unspecified atrial fibrillation: Secondary | ICD-10-CM | POA: Insufficient documentation

## 2011-07-13 DIAGNOSIS — I10 Essential (primary) hypertension: Secondary | ICD-10-CM | POA: Insufficient documentation

## 2011-07-13 LAB — CBC WITH DIFFERENTIAL/PLATELET
Basophils Absolute: 0 10*3/uL (ref 0.0–0.1)
Basophils Relative: 0.2 % (ref 0.0–3.0)
Eosinophils Absolute: 0 10*3/uL (ref 0.0–0.7)
Eosinophils Relative: 0.3 % (ref 0.0–5.0)
Hemoglobin: 15.2 g/dL — ABNORMAL HIGH (ref 12.0–15.0)
Lymphs Abs: 1.5 10*3/uL (ref 0.7–4.0)
Monocytes Absolute: 0.4 10*3/uL (ref 0.1–1.0)
Neutro Abs: 2.7 10*3/uL (ref 1.4–7.7)
Neutrophils Relative %: 57.7 % (ref 43.0–77.0)
WBC: 4.7 10*3/uL (ref 4.5–10.5)

## 2011-07-13 MED ORDER — WARFARIN SODIUM 2.5 MG PO TABS: 2.5000 mg | ORAL_TABLET | Freq: Every day | ORAL | Status: DC

## 2011-07-13 NOTE — Patient Instructions (Addendum)
Please re-start Coumadin 2.5 mg a day.  Stop Asprin  Continue all other medications the same.  Please have INR checked here at our office on Tuesday.  Have blood work today. (CBC)  Follow up in 2 months.

## 2011-07-13 NOTE — Assessment & Plan Note (Signed)
I had a long discussion with the patient and her daughter today. She does not have a significant contraindication to Coumadin. However, she has significant cardioembolic risk for CVA. Therefore, Coumadin would be indicated. She and her daughter seem to understand the risks benefits and would agree to continuing back on Coumadin. I will check a CBC and restart this medication. She will have followup in our clinic.

## 2011-07-13 NOTE — Assessment & Plan Note (Signed)
The blood pressure is at target. No change in medications is indicated. We will continue with therapeutic lifestyle changes (TLC).  

## 2011-07-13 NOTE — Progress Notes (Signed)
HPI The patient presents for follow up of atrial fibrillaiton.  She has been followed by Dr. Deborah Chalk.  Because of a previous fall she had been taken off of Coumadin. However , she is not having frequent falls currently. She lives by herself but has an aid who checks on her. She has dementia which is apparently significant but her meds or arranged for her. She denies any chest pressure, neck or arm discomfort. She doesn't notice any palpitations and has no presyncope or syncope. She's had no weight gain or edema.  No Known Allergies  Current Outpatient Prescriptions  Medication Sig Dispense Refill  . aspirin 81 MG tablet Take 81 mg by mouth daily.        Marland Kitchen atenolol (TENORMIN) 25 MG tablet Take 25 mg by mouth 2 (two) times daily.        . calcium-vitamin D (OSCAL WITH D) 500-200 MG-UNIT per tablet Take 1 tablet by mouth daily.        . digoxin (LANOXIN) 0.125 MG tablet Take 1 tablet by mouth daily.      . ferrous sulfate 325 (65 FE) MG tablet Take 325 mg by mouth 2 (two) times daily.        . furosemide (LASIX) 20 MG tablet ONE TABLET 5 X WEEKLY OR AS DIRECTED.  30 tablet  6  . levothyroxine (SYNTHROID, LEVOTHROID) 50 MCG tablet Take 1 tablet by mouth daily.      . Multiple Vitamin (MULTIVITAMIN) tablet Take 1 tablet by mouth daily.        . vitamin D, CHOLECALCIFEROL, 400 UNITS tablet Take 400 Units by mouth daily.          Past Medical History  Diagnosis Date  . Anxiety   . HTN (hypertension)   . Atrial fibrillation   . Dementia     Past Surgical History  Procedure Date  . Appendectomy   . Head surgery     nerves  . Total hip arthroplasty     ROS:  As stated in the HPI and negative for all other systems.  PHYSICAL EXAM BP 98/62  Pulse 77  Ht 5\' 4"  (1.626 m)  Wt 114 lb (51.71 kg)  BMI 19.57 kg/m2 GENERAL:  Well appearing HEENT:  Pupils equal round and reactive, fundi not visualized, oral mucosa unremarkable NECK:  No jugular venous distention, waveform within normal limits,  carotid upstroke brisk and symmetric, no bruits, no thyromegaly LYMPHATICS:  No cervical, inguinal adenopathy LUNGS:  Clear to auscultation bilaterally BACK:  No CVA tenderness CHEST:  Unremarkable HEART:  PMI not displaced or sustained,S1 and S2 within normal limits, no S3, irregular, no clicks, no rubs, no murmurs ABD:  Flat, positive bowel sounds normal in frequency in pitch, no bruits, no rebound, no guarding, no midline pulsatile mass, no hepatomegaly, no splenomegaly EXT:  2 plus pulses throughout, no edema, no cyanosis no clubbing SKIN:  No rashes no nodules NEURO:  Cranial nerves II through XII grossly intact, motor grossly intact throughout PSYCH:  Cognitively intact, oriented to person place and time  EKG: Atrial fibrillation, rate Rate 77, ST T wave changes consistent with dig effect.  ASSESSMENT AND PLAN

## 2011-07-17 ENCOUNTER — Other Ambulatory Visit: Payer: Self-pay

## 2011-07-17 ENCOUNTER — Ambulatory Visit (INDEPENDENT_AMBULATORY_CARE_PROVIDER_SITE_OTHER): Payer: Medicare Other | Admitting: *Deleted

## 2011-07-17 DIAGNOSIS — Z7901 Long term (current) use of anticoagulants: Secondary | ICD-10-CM

## 2011-07-17 DIAGNOSIS — I4891 Unspecified atrial fibrillation: Secondary | ICD-10-CM

## 2011-07-17 IMAGING — CT CT CHEST W/ CM
3 series · 16 of 30 positions shown, 18 images · IV contrast (agent unspecified)
Comparison: Abdomen pelvis CT 06/21/2010

CLINICAL DATA: Left pleural effusion.  Left mid back pain and
tenderness.  Fell 1 week ago.

CT CHEST WITH CONTRAST
TECHNIQUE: Multidetector CT imaging of the chest was performed
following the standard protocol during bolus administration of
intravenous contrast.
Contrast: 75 ml Fmnipaque-ARR

[Series 2: routine chest · axial · 0.62mm/px · z∈[-208,-58]mm · 5 of 50 slices shown, 7 images]
[im 10/50  mediastinal]
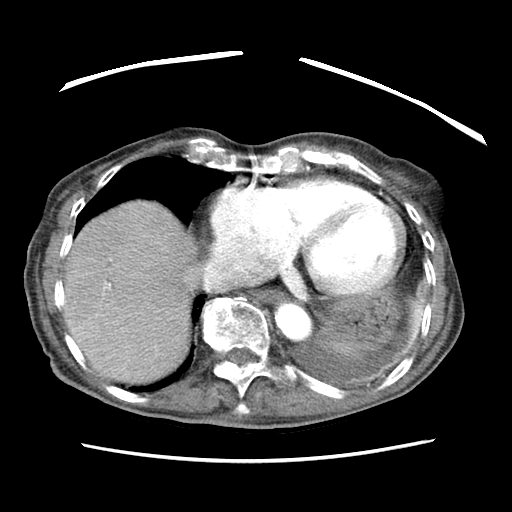
[im 10/50  lung]
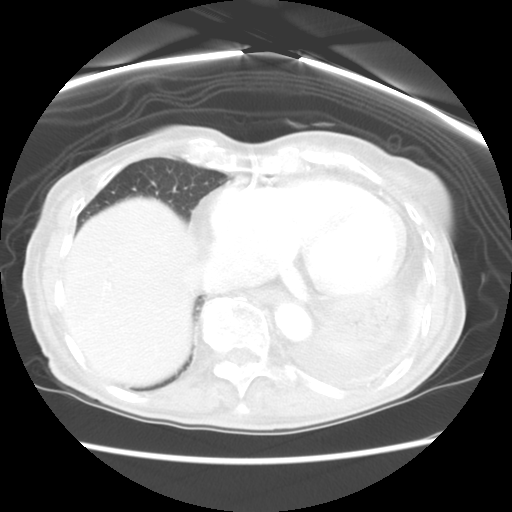
[im 20/50  lung]
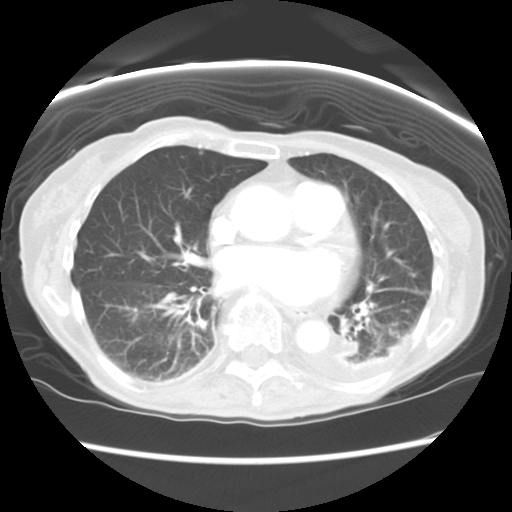
[im 28/50  lung]
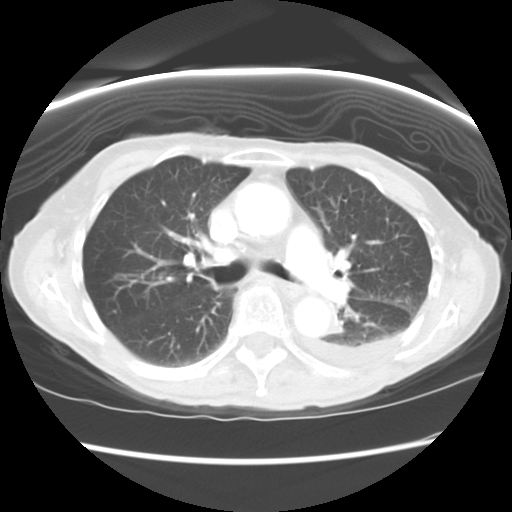
[im 30/50  lung]
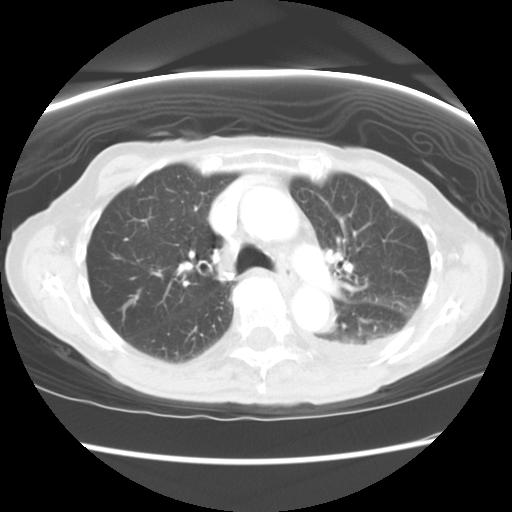
[im 40/50  mediastinal]
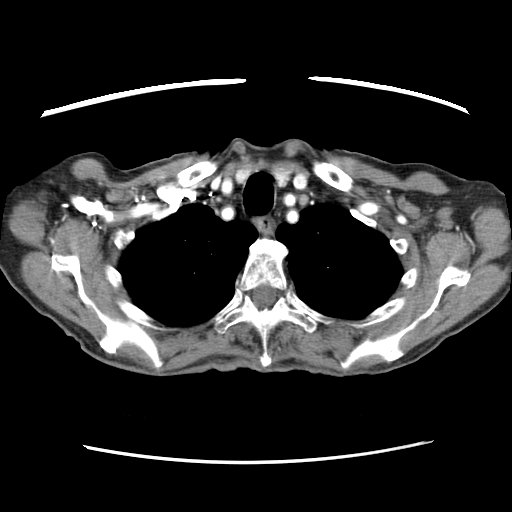
[im 40/50  lung]
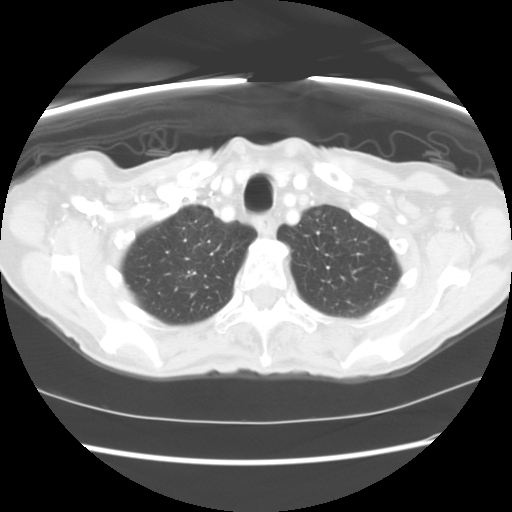

[Series 400: sagittal · sagittal · 0.62mm/px · 8 of 120 slices shown]
[im 10/120  lung]
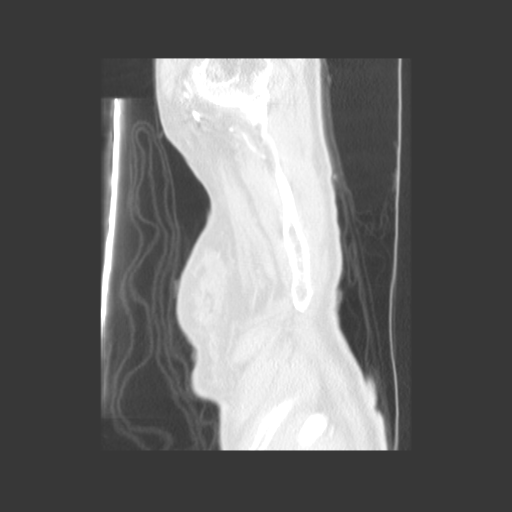
[im 28/120  lung]
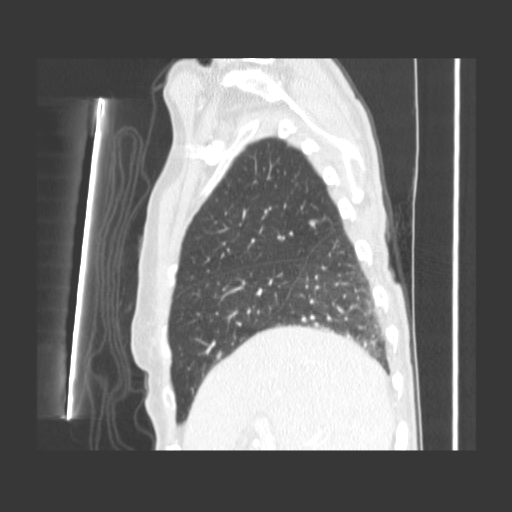
[im 46/120  lung]
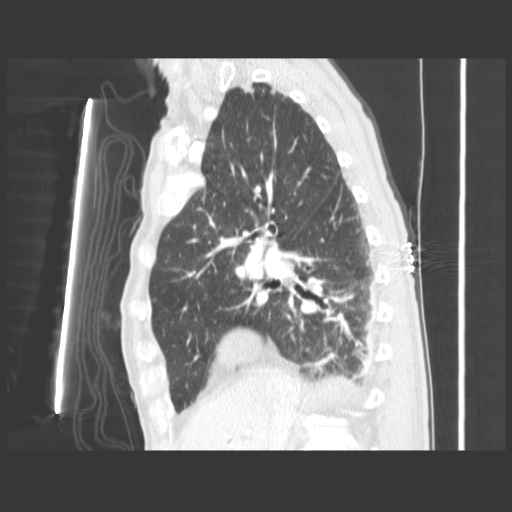
[im 55/120  lung]
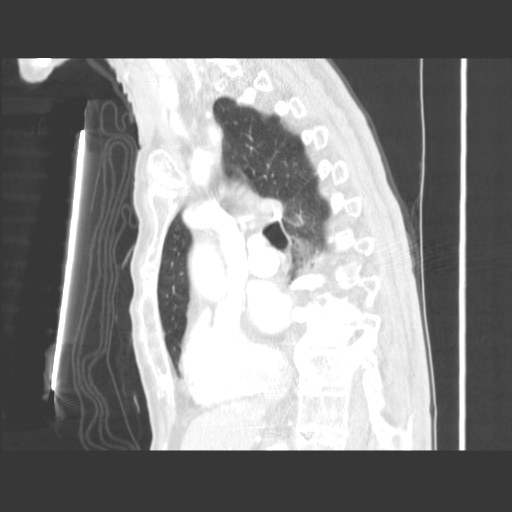
[im 65/120  lung]
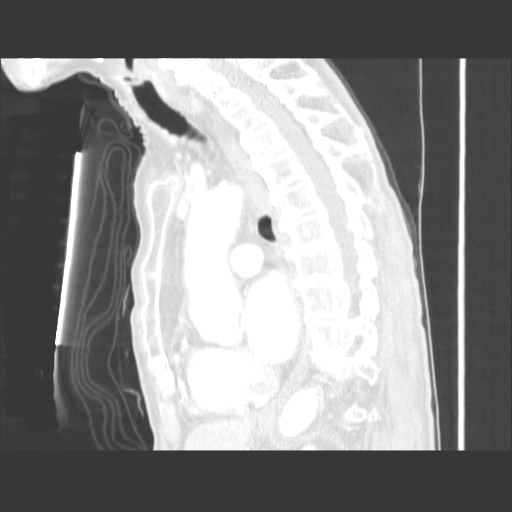
[im 74/120  lung]
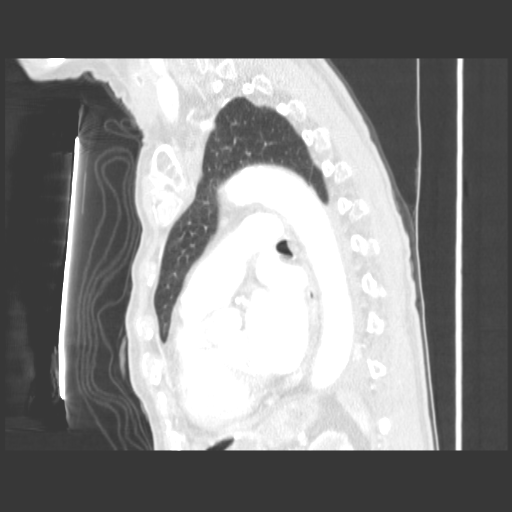
[im 92/120  lung]
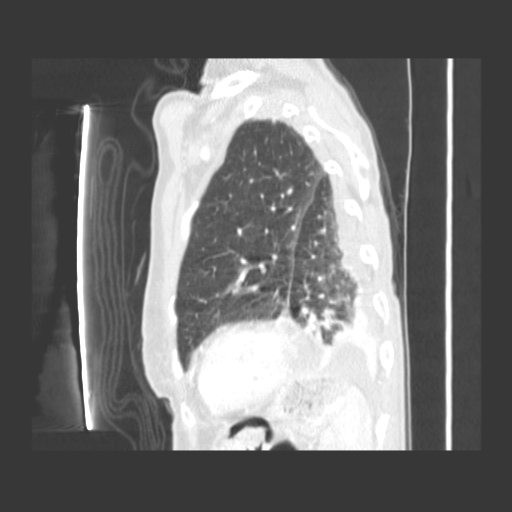
[im 110/120  lung]
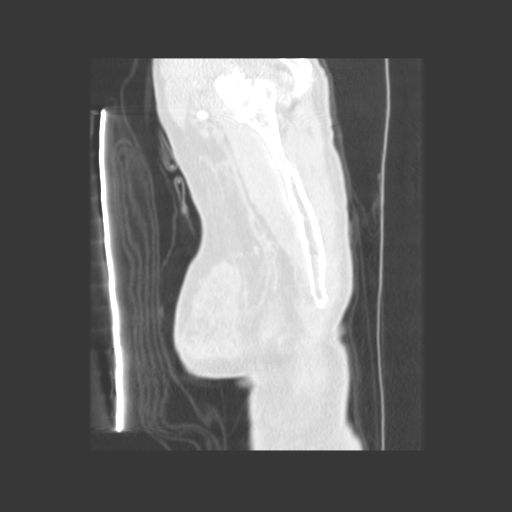

[Series 401: coronal · coronal · 0.62mm/px · 3 of 73 slices shown]
[im 11/73  lung]
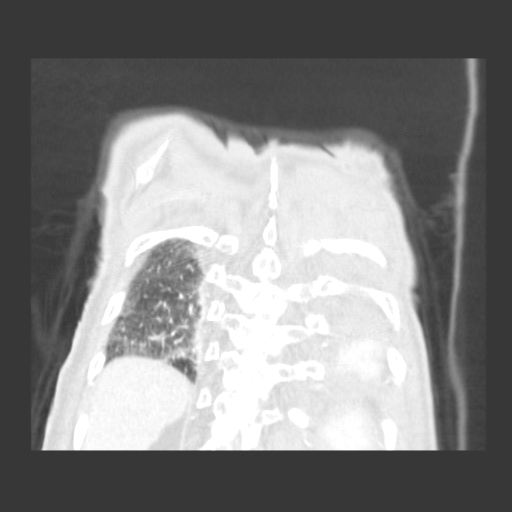
[im 21/73  lung]
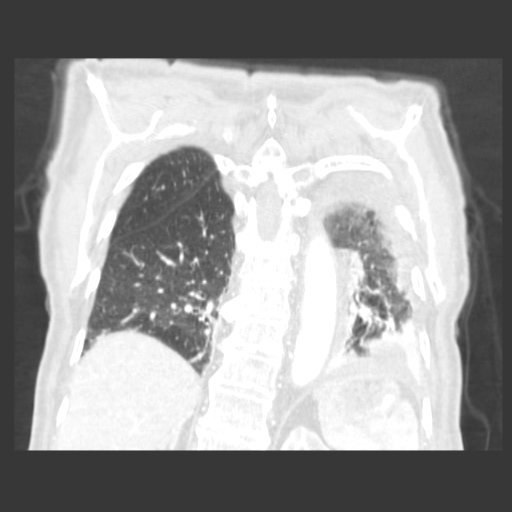
[im 31/73  lung]
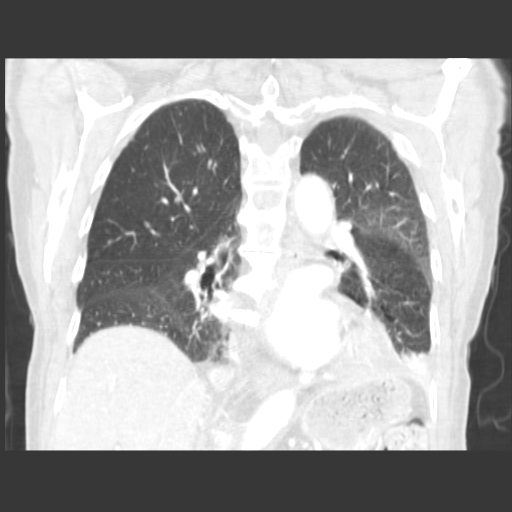

[16 of 30 positions shown; findings below may reference images not displayed]

FINDINGS: On the lateral view of the scout radiographs, a moderate
compression deformity of and upper lumbar spine vertebral body
(thought to be L1) is present.  When compared to the CT abdomen
pelvis 06/21/2010, this compression deformity appears stable.
Today's CT images do not include this vertebral body.

Moderate cardiomegaly is stable.  Both atria are prominent.
Coronary artery atherosclerotic calcifications are noted.

There is scattered atherosclerotic calcification of the thoracic
aorta.  The aorta is normal in caliber.  Negative for aortic
dissection.

A small simple appearing left pleural effusion is present.  No
pleural enhancement or nodularity is identified.

There is atelectasis in the left lower lobe overlying the pleural
effusion.  Patchy  opacity in the left lower lobe image #30 of the
lung window is noted.  This could reflect atelectasis or airspace
disease.  There is mild pleuroparenchymal scarring at the lung
apices bilaterally.  The trachea and mainstem bronchi are patent.
Negative for pneumothorax.

The anterior/inferior aspects of some of the lower ribs are not
completely included on this study.  An acute nondisplaced fracture
is identified in the posterolateral left tenth rib (image #47 of
series 3).  The thoracic spine vertebral bodies are normal in
height and alignment.  There is some bony bridging between the
spinous processes of multiple thoracic spine vertebral bodies.

Incidentally imaged portion of the upper abdomen shows a small
amount of pneumobilia in the left lobe of the liver.
IMPRESSION: 1.  Acute nondisplaced posterolateral left tenth rib fracture.
Please note that some of the anterior aspects of the lower ribs are
not completely imaged on this CT examination.
2.  Small simple appearing left pleural effusion with overlying
left lower lobe atelectasis.
3.  Patchy opacity left lower lobe is favored to be due to
atelectasis, but airspace disease is difficult to exclude.
4.  Moderate cardiomegaly and coronary artery atherosclerosis.
5.  Small amount of pneumobilia in the left lobe of the liver.
6.  Stable moderate compression deformity of the L1 vertebral body.
This is unchanged compared to CT abdomen pelvis 06/21/2010.

The study is made a call report.

## 2011-07-17 MED ORDER — WARFARIN SODIUM 1 MG PO TABS: ORAL_TABLET | ORAL | Status: DC

## 2011-08-01 ENCOUNTER — Encounter: Payer: Medicare Other | Admitting: Emergency Medicine

## 2011-08-08 ENCOUNTER — Ambulatory Visit (INDEPENDENT_AMBULATORY_CARE_PROVIDER_SITE_OTHER): Payer: Medicare Other | Admitting: Emergency Medicine

## 2011-08-08 DIAGNOSIS — I4891 Unspecified atrial fibrillation: Secondary | ICD-10-CM

## 2011-08-08 DIAGNOSIS — Z7901 Long term (current) use of anticoagulants: Secondary | ICD-10-CM

## 2011-08-22 ENCOUNTER — Other Ambulatory Visit: Payer: Self-pay | Admitting: Cardiology

## 2011-08-22 ENCOUNTER — Ambulatory Visit (INDEPENDENT_AMBULATORY_CARE_PROVIDER_SITE_OTHER): Payer: Medicare Other | Admitting: Emergency Medicine

## 2011-08-22 DIAGNOSIS — Z7901 Long term (current) use of anticoagulants: Secondary | ICD-10-CM

## 2011-08-22 DIAGNOSIS — I4891 Unspecified atrial fibrillation: Secondary | ICD-10-CM

## 2011-08-22 MED ORDER — ATENOLOL 25 MG PO TABS: 25.0000 mg | ORAL_TABLET | Freq: Two times a day (BID) | ORAL | Status: DC

## 2011-08-26 ENCOUNTER — Encounter: Payer: Self-pay | Admitting: Family Medicine

## 2011-08-26 DIAGNOSIS — R251 Tremor, unspecified: Secondary | ICD-10-CM | POA: Insufficient documentation

## 2011-08-26 DIAGNOSIS — F0390 Unspecified dementia without behavioral disturbance: Secondary | ICD-10-CM | POA: Insufficient documentation

## 2011-08-26 DIAGNOSIS — D649 Anemia, unspecified: Secondary | ICD-10-CM | POA: Insufficient documentation

## 2011-08-26 DIAGNOSIS — Z8701 Personal history of pneumonia (recurrent): Secondary | ICD-10-CM | POA: Insufficient documentation

## 2011-08-26 DIAGNOSIS — S72002A Fracture of unspecified part of neck of left femur, initial encounter for closed fracture: Secondary | ICD-10-CM | POA: Insufficient documentation

## 2011-08-26 DIAGNOSIS — M4800 Spinal stenosis, site unspecified: Secondary | ICD-10-CM | POA: Insufficient documentation

## 2011-08-26 NOTE — Assessment & Plan Note (Signed)
On DXA 04/10/2001

## 2011-08-28 ENCOUNTER — Encounter: Payer: Self-pay | Admitting: Family Medicine

## 2011-08-28 ENCOUNTER — Ambulatory Visit (INDEPENDENT_AMBULATORY_CARE_PROVIDER_SITE_OTHER): Payer: Medicare Other | Admitting: Family Medicine

## 2011-08-28 ENCOUNTER — Other Ambulatory Visit: Payer: Self-pay | Admitting: Family Medicine

## 2011-08-28 DIAGNOSIS — Z5181 Encounter for therapeutic drug level monitoring: Secondary | ICD-10-CM

## 2011-08-28 DIAGNOSIS — I4891 Unspecified atrial fibrillation: Secondary | ICD-10-CM

## 2011-08-28 DIAGNOSIS — F0390 Unspecified dementia without behavioral disturbance: Secondary | ICD-10-CM

## 2011-08-28 DIAGNOSIS — Z23 Encounter for immunization: Secondary | ICD-10-CM

## 2011-08-28 DIAGNOSIS — M810 Age-related osteoporosis without current pathological fracture: Secondary | ICD-10-CM

## 2011-08-28 DIAGNOSIS — Z7901 Long term (current) use of anticoagulants: Secondary | ICD-10-CM

## 2011-08-28 DIAGNOSIS — R251 Tremor, unspecified: Secondary | ICD-10-CM

## 2011-08-28 DIAGNOSIS — R259 Unspecified abnormal involuntary movements: Secondary | ICD-10-CM

## 2011-08-28 DIAGNOSIS — I10 Essential (primary) hypertension: Secondary | ICD-10-CM

## 2011-08-28 NOTE — Patient Instructions (Signed)
Check with your insurance to see if they will cover the shingles shot. Come back for a lab visit with Terri in 2 weeks to recheck your INR.  Schedule a visit in 3/13 with me.  Please schedule a lab visit ahead of that office visit (for yearly labs). Take care and let me know if you have concerns in the meantime.

## 2011-08-28 NOTE — Progress Notes (Signed)
To est care. Will be due for repeat labs in 3/12.  Here with sister to provide history.   Dementia.  No recent falls.  Uses walker.  1 story home.  She doesn't use the stove.  Feeling well, no change in weight.  Has help from family and neighbors.    AF. On coumadin, would like to have INRs here.  No CP, sob; minimal ble edema.  Feeling well.  No tachy palpitations.   Hypertension:    Using medication without problems or lightheadedness: yes Chest pain with exertion:no Edema:minimal Short of breath:no  Meds, vitals, and allergies reviewed.   PMH and SH reviewed  ROS: See HPI.  Otherwise negative.    GEN: nad, alert, pleasant in conversation but not oriented HEENT: mucous membranes moist NECK: supple w/o LA CV: IRR PULM: ctab, no inc wob ABD: soft, +bs EXT: trace edema SKIN: no acute rash MMSE 16/30 Minimal tremor in hands noted with arm extension

## 2011-08-30 ENCOUNTER — Encounter: Payer: Self-pay | Admitting: Family Medicine

## 2011-08-30 NOTE — Assessment & Plan Note (Signed)
Minimal, will follow. 

## 2011-08-30 NOTE — Assessment & Plan Note (Signed)
No change in meds.

## 2011-08-30 NOTE — Assessment & Plan Note (Signed)
Has significant recall deficit. Had help with care, meals, home.  Appears to be compensated with much help from others.  Will follow clinically.

## 2011-08-30 NOTE — Assessment & Plan Note (Signed)
Return for INR.  Exam benign today.

## 2011-09-13 ENCOUNTER — Ambulatory Visit (INDEPENDENT_AMBULATORY_CARE_PROVIDER_SITE_OTHER): Payer: Medicare Other | Admitting: Cardiology

## 2011-09-13 ENCOUNTER — Encounter: Payer: Medicare Other | Admitting: *Deleted

## 2011-09-13 ENCOUNTER — Ambulatory Visit: Payer: Medicare Other

## 2011-09-13 ENCOUNTER — Encounter: Payer: Self-pay | Admitting: Cardiology

## 2011-09-13 DIAGNOSIS — I10 Essential (primary) hypertension: Secondary | ICD-10-CM

## 2011-09-13 DIAGNOSIS — I4891 Unspecified atrial fibrillation: Secondary | ICD-10-CM

## 2011-09-13 LAB — CBC WITH DIFFERENTIAL/PLATELET
Basophils Relative: 0.2 % (ref 0.0–3.0)
HCT: 44.6 % (ref 36.0–46.0)
Hemoglobin: 15.2 g/dL — ABNORMAL HIGH (ref 12.0–15.0)
MCHC: 34.1 g/dL (ref 30.0–36.0)
Monocytes Absolute: 0.3 10*3/uL (ref 0.1–1.0)
Neutro Abs: 2.5 10*3/uL (ref 1.4–7.7)
Platelets: 96 10*3/uL — ABNORMAL LOW (ref 150.0–400.0)
RBC: 4.84 Mil/uL (ref 3.87–5.11)

## 2011-09-13 LAB — PROTIME-INR: INR: 2.3 ratio — ABNORMAL HIGH (ref 0.8–1.0)

## 2011-09-13 NOTE — Assessment & Plan Note (Addendum)
She tolerates this rhythm and rate control and anticoagulation. We will continue with the meds as listed.  I will check a PT and CBC today.

## 2011-09-13 NOTE — Patient Instructions (Signed)
Please have lab work today  The current medical regimen is effective;  continue present plan and medications.  Follow up in 1 year with Dr Hochrein.  You will receive a letter in the mail 2 months before you are due.  Please call us when you receive this letter to schedule your follow up appointment.  

## 2011-09-13 NOTE — Assessment & Plan Note (Signed)
The blood pressure is at target. No change in medications is indicated. We will continue with therapeutic lifestyle changes (TLC).  

## 2011-09-13 NOTE — Progress Notes (Signed)
HPI The patient presents for follow up of atrial fibrillaiton.  She has been followed by Dr. Deborah Chalk.  At the last visit I started her on Coumadin.  She has done well with this.  She has had no bleeding issues and denies and problems with the coumadin.  She is not feeling any palpitations.  She has not fallen and faithfully uses her walker.  She has had no bruising or bleeding.  Allergies  Allergen Reactions  . Alendronate Sodium   . Aricept (Donepezil Hydrochloride)     Current Outpatient Prescriptions  Medication Sig Dispense Refill  . atenolol (TENORMIN) 25 MG tablet Take 1 tablet (25 mg total) by mouth 2 (two) times daily.  60 tablet  6  . calcium-vitamin D (OSCAL WITH D) 500-200 MG-UNIT per tablet Take 1 tablet by mouth daily.        . digoxin (LANOXIN) 0.125 MG tablet Take 1 tablet by mouth daily.      . ferrous sulfate 325 (65 FE) MG tablet Take 325 mg by mouth 2 (two) times daily.        . furosemide (LASIX) 20 MG tablet ONE TABLET 5 X WEEKLY OR AS DIRECTED.  30 tablet  6  . levothyroxine (SYNTHROID, LEVOTHROID) 50 MCG tablet Take 1 tablet by mouth daily. 6 days a week, not on Sundays      . Multiple Vitamin (MULTIVITAMIN) tablet Take 1 tablet by mouth daily.       . vitamin D, CHOLECALCIFEROL, 400 UNITS tablet Take 400 Units by mouth daily.        Marland Kitchen warfarin (COUMADIN) 1 MG tablet Take as directed by Anticoagulation clinic  30 tablet  3    Past Medical History  Diagnosis Date  . Anxiety   . HTN (hypertension)   . Atrial fibrillation   . Dementia   . Anemia   . Tremor   . History of pneumonia   . Hypothyroid   . Osteoporosis     prev intolerant of alendronate    Past Surgical History  Procedure Date  . Appendectomy   . Head surgery     for trigeminal neuralgia  . Total hip arthroplasty   . Cataract extraction     ROS:  As stated in the HPI and negative for all other systems.  PHYSICAL EXAM BP 117/68  Pulse 94  Ht 5\' 4"  (1.626 m)  Wt 117 lb 1.9 oz (53.125 kg)   BMI 20.10 kg/m2 GENERAL: Frail but in no distress NECK:  No jugular venous distention, waveform within normal limits, carotid upstroke brisk and symmetric, no bruits, no thyromegaly LUNGS:  Clear to auscultation bilaterally BACK:  No CVA tenderness CHEST:  Unremarkable HEART:  PMI not displaced or sustained,S1 and S2 within normal limits, no S3, irregular, no clicks, no rubs, no murmurs, irregular ABD:  Flat, positive bowel sounds normal in frequency in pitch, no bruits, no rebound, no guarding, no midline pulsatile mass, no hepatomegaly, no splenomegaly EXT:  2 plus pulses throughout, no edema, no cyanosis no clubbing   ASSESSMENT AND PLAN

## 2011-09-18 ENCOUNTER — Ambulatory Visit (INDEPENDENT_AMBULATORY_CARE_PROVIDER_SITE_OTHER): Payer: Self-pay | Admitting: Cardiovascular Disease

## 2011-09-18 DIAGNOSIS — I4891 Unspecified atrial fibrillation: Secondary | ICD-10-CM

## 2011-09-18 DIAGNOSIS — R0989 Other specified symptoms and signs involving the circulatory and respiratory systems: Secondary | ICD-10-CM

## 2011-09-18 DIAGNOSIS — Z7901 Long term (current) use of anticoagulants: Secondary | ICD-10-CM

## 2011-10-11 ENCOUNTER — Ambulatory Visit (INDEPENDENT_AMBULATORY_CARE_PROVIDER_SITE_OTHER): Payer: 59 | Admitting: Family Medicine

## 2011-10-11 DIAGNOSIS — Z7901 Long term (current) use of anticoagulants: Secondary | ICD-10-CM

## 2011-10-11 DIAGNOSIS — Z5181 Encounter for therapeutic drug level monitoring: Secondary | ICD-10-CM

## 2011-10-11 DIAGNOSIS — I4891 Unspecified atrial fibrillation: Secondary | ICD-10-CM

## 2011-10-11 NOTE — Patient Instructions (Signed)
Continue 1mg  daily except 0.5 mg Fri.  Recheck in 2 weeks.

## 2011-10-16 ENCOUNTER — Encounter: Payer: Medicare Other | Admitting: *Deleted

## 2011-10-25 ENCOUNTER — Ambulatory Visit (INDEPENDENT_AMBULATORY_CARE_PROVIDER_SITE_OTHER): Payer: 59 | Admitting: Family Medicine

## 2011-10-25 DIAGNOSIS — I4891 Unspecified atrial fibrillation: Secondary | ICD-10-CM

## 2011-10-25 DIAGNOSIS — Z5181 Encounter for therapeutic drug level monitoring: Secondary | ICD-10-CM

## 2011-10-25 DIAGNOSIS — Z7901 Long term (current) use of anticoagulants: Secondary | ICD-10-CM

## 2011-10-25 NOTE — Patient Instructions (Signed)
Continue current dose, check in 4 weeks  

## 2011-11-13 ENCOUNTER — Other Ambulatory Visit: Payer: Self-pay | Admitting: Cardiology

## 2011-11-13 MED ORDER — DIGOXIN 125 MCG PO TABS: 125.0000 ug | ORAL_TABLET | Freq: Every day | ORAL | Status: DC

## 2011-11-13 MED ORDER — WARFARIN SODIUM 1 MG PO TABS: ORAL_TABLET | ORAL | Status: DC

## 2011-11-22 ENCOUNTER — Ambulatory Visit: Payer: 59

## 2011-11-23 ENCOUNTER — Ambulatory Visit: Payer: 59

## 2011-11-28 ENCOUNTER — Ambulatory Visit: Payer: 59

## 2011-12-03 ENCOUNTER — Ambulatory Visit (INDEPENDENT_AMBULATORY_CARE_PROVIDER_SITE_OTHER): Payer: 59 | Admitting: Family Medicine

## 2011-12-03 DIAGNOSIS — I4891 Unspecified atrial fibrillation: Secondary | ICD-10-CM

## 2011-12-03 DIAGNOSIS — Z5181 Encounter for therapeutic drug level monitoring: Secondary | ICD-10-CM

## 2011-12-03 DIAGNOSIS — Z7901 Long term (current) use of anticoagulants: Secondary | ICD-10-CM

## 2011-12-03 LAB — POCT INR: INR: 2.2

## 2011-12-03 NOTE — Patient Instructions (Signed)
Continue current dose, check in 4 weeks  

## 2011-12-12 ENCOUNTER — Other Ambulatory Visit: Payer: Self-pay

## 2011-12-12 DIAGNOSIS — R609 Edema, unspecified: Secondary | ICD-10-CM

## 2011-12-12 MED ORDER — FUROSEMIDE 20 MG PO TABS: ORAL_TABLET | ORAL | Status: DC

## 2011-12-12 NOTE — Telephone Encounter (Signed)
.   Requested Prescriptions   Pending Prescriptions Disp Refills  . furosemide (LASIX) 20 MG tablet 30 tablet 6    Sig: ONE TABLET 5 X WEEKLY OR AS DIRECTED.

## 2011-12-31 ENCOUNTER — Emergency Department (HOSPITAL_COMMUNITY): Payer: PRIVATE HEALTH INSURANCE

## 2011-12-31 ENCOUNTER — Encounter (HOSPITAL_COMMUNITY): Payer: Self-pay

## 2011-12-31 ENCOUNTER — Inpatient Hospital Stay (HOSPITAL_COMMUNITY)
Admission: EM | Admit: 2011-12-31 | Discharge: 2012-01-03 | DRG: 690 | Disposition: A | Discharge status: Skilled Nursing Facility | Payer: PRIVATE HEALTH INSURANCE | Attending: Internal Medicine | Admitting: Internal Medicine

## 2011-12-31 ENCOUNTER — Ambulatory Visit: Payer: 59

## 2011-12-31 DIAGNOSIS — I4891 Unspecified atrial fibrillation: Secondary | ICD-10-CM | POA: Diagnosis present

## 2011-12-31 DIAGNOSIS — E039 Hypothyroidism, unspecified: Secondary | ICD-10-CM | POA: Diagnosis present

## 2011-12-31 DIAGNOSIS — D696 Thrombocytopenia, unspecified: Secondary | ICD-10-CM | POA: Diagnosis present

## 2011-12-31 DIAGNOSIS — F068 Other specified mental disorders due to known physiological condition: Secondary | ICD-10-CM | POA: Diagnosis present

## 2011-12-31 DIAGNOSIS — Z7901 Long term (current) use of anticoagulants: Secondary | ICD-10-CM

## 2011-12-31 DIAGNOSIS — I252 Old myocardial infarction: Secondary | ICD-10-CM

## 2011-12-31 DIAGNOSIS — R55 Syncope and collapse: Secondary | ICD-10-CM | POA: Diagnosis present

## 2011-12-31 DIAGNOSIS — I10 Essential (primary) hypertension: Secondary | ICD-10-CM | POA: Diagnosis present

## 2011-12-31 DIAGNOSIS — N390 Urinary tract infection, site not specified: Principal | ICD-10-CM | POA: Diagnosis present

## 2011-12-31 DIAGNOSIS — M6282 Rhabdomyolysis: Secondary | ICD-10-CM | POA: Diagnosis present

## 2011-12-31 DIAGNOSIS — Z96649 Presence of unspecified artificial hip joint: Secondary | ICD-10-CM

## 2011-12-31 DIAGNOSIS — W19XXXA Unspecified fall, initial encounter: Secondary | ICD-10-CM

## 2011-12-31 DIAGNOSIS — E860 Dehydration: Secondary | ICD-10-CM

## 2011-12-31 DIAGNOSIS — M129 Arthropathy, unspecified: Secondary | ICD-10-CM | POA: Diagnosis present

## 2011-12-31 HISTORY — DX: Fracture of unspecified part of neck of left femur, initial encounter for closed fracture: S72.002A

## 2011-12-31 HISTORY — DX: Essential (primary) hypertension: I10

## 2011-12-31 HISTORY — DX: Acute myocardial infarction, unspecified: I21.9

## 2011-12-31 HISTORY — DX: Hypothyroidism, unspecified: E03.9

## 2011-12-31 HISTORY — DX: Unspecified osteoarthritis, unspecified site: M19.90

## 2011-12-31 LAB — URINALYSIS, MICROSCOPIC ONLY
Nitrite: NEGATIVE
Protein, ur: 100 mg/dL — AB
Specific Gravity, Urine: 1.025 (ref 1.005–1.030)
Urobilinogen, UA: 0.2 mg/dL (ref 0.0–1.0)

## 2011-12-31 LAB — COMPREHENSIVE METABOLIC PANEL
BUN: 19 mg/dL (ref 6–23)
CO2: 25 mEq/L (ref 19–32)
Calcium: 9.6 mg/dL (ref 8.4–10.5)
Chloride: 102 mEq/L (ref 96–112)
Creatinine, Ser: 0.72 mg/dL (ref 0.50–1.10)
GFR calc Af Amer: 87 mL/min — ABNORMAL LOW (ref >90)
GFR calc non Af Amer: 75 mL/min — ABNORMAL LOW (ref >90)
Glucose, Bld: 96 mg/dL (ref 70–99)
Total Bilirubin: 1.2 mg/dL (ref 0.3–1.2)

## 2011-12-31 LAB — DIFFERENTIAL
Eosinophils Relative: 0 % (ref 0–5)
Lymphocytes Relative: 11 % — ABNORMAL LOW (ref 12–46)
Lymphs Abs: 1.1 10*3/uL (ref 0.7–4.0)
Monocytes Absolute: 0.9 10*3/uL (ref 0.1–1.0)
Monocytes Relative: 10 % (ref 3–12)

## 2011-12-31 LAB — CBC
HCT: 45.5 % (ref 36.0–46.0)
Hemoglobin: 16 g/dL — ABNORMAL HIGH (ref 12.0–15.0)
MCV: 88.9 fL (ref 78.0–100.0)
RBC: 5.12 MIL/uL — ABNORMAL HIGH (ref 3.87–5.11)
RDW: 13.1 % (ref 11.5–15.5)
WBC: 9.8 10*3/uL (ref 4.0–10.5)

## 2011-12-31 LAB — LIPASE, BLOOD: Lipase: 36 U/L (ref 11–59)

## 2011-12-31 LAB — CARDIAC PANEL(CRET KIN+CKTOT+MB+TROPI)
CK, MB: 13.9 ng/mL (ref 0.3–4.0)
Relative Index: 1.5 (ref 0.0–2.5)
Relative Index: 1.4 (ref 0.0–2.5)
Total CK: 1307 U/L — ABNORMAL HIGH (ref 7–177)
Troponin I: <0.3 ng/mL (ref <0.30)

## 2011-12-31 LAB — PROTIME-INR: INR: 2.72 — ABNORMAL HIGH (ref 0.00–1.49)

## 2011-12-31 MED ORDER — ALUM & MAG HYDROXIDE-SIMETH 200-200-20 MG/5ML PO SUSP: 30.0000 mL | Freq: Four times a day (QID) | ORAL | Status: DC | PRN

## 2011-12-31 MED ORDER — DEXTROSE 5 % IV SOLN
1.0000 g | INTRAVENOUS | Status: DC
  Administered 2012-01-01 – 2012-01-03 (×3): 1 g via INTRAVENOUS
  Filled 2011-12-31 (×3): qty 10

## 2011-12-31 MED ORDER — CHOLECALCIFEROL 10 MCG (400 UNIT) PO TABS
400.0000 [IU] | ORAL_TABLET | Freq: Two times a day (BID) | ORAL | Status: DC
  Administered 2011-12-31 – 2012-01-03 (×6): 400 [IU] via ORAL
  Filled 2011-12-31 (×7): qty 1

## 2011-12-31 MED ORDER — VITAMIN D (CHOLECALCIFEROL) 10 MCG (400 UNIT) PO CHEW: 400.0000 [IU] | CHEWABLE_TABLET | Freq: Two times a day (BID) | ORAL | Status: DC

## 2011-12-31 MED ORDER — ACETAMINOPHEN 325 MG PO TABS
650.0000 mg | ORAL_TABLET | Freq: Four times a day (QID) | ORAL | Status: DC | PRN
  Administered 2012-01-02: 650 mg via ORAL
  Filled 2011-12-31 (×2): qty 2

## 2011-12-31 MED ORDER — DEXTROSE 5 % IV SOLN
1.0000 g | Freq: Once | INTRAVENOUS | Status: AC
  Administered 2011-12-31: 1 g via INTRAVENOUS
  Filled 2011-12-31: qty 10

## 2011-12-31 MED ORDER — DIGOXIN 125 MCG PO TABS
125.0000 ug | ORAL_TABLET | Freq: Every day | ORAL | Status: DC
  Administered 2011-12-31 – 2012-01-03 (×4): 125 ug via ORAL
  Filled 2011-12-31 (×4): qty 1

## 2011-12-31 MED ORDER — INFLUENZA VIRUS VACC SPLIT PF IM SUSP
0.5000 mL | INTRAMUSCULAR | Status: AC
  Administered 2012-01-01: 0.5 mL via INTRAMUSCULAR
  Filled 2011-12-31: qty 0.5

## 2011-12-31 MED ORDER — HALOPERIDOL LACTATE 5 MG/ML IJ SOLN: 2.0000 mg | Freq: Four times a day (QID) | INTRAMUSCULAR | Status: DC | PRN

## 2011-12-31 MED ORDER — SODIUM CHLORIDE 0.9 % IJ SOLN
3.0000 mL | Freq: Two times a day (BID) | INTRAMUSCULAR | Status: DC
  Administered 2011-12-31 – 2012-01-03 (×4): 3 mL via INTRAVENOUS

## 2011-12-31 MED ORDER — SODIUM CHLORIDE 0.9 % IV SOLN: Freq: Once | INTRAVENOUS | Status: DC

## 2011-12-31 MED ORDER — FERROUS SULFATE 325 (65 FE) MG PO TABS
325.0000 mg | ORAL_TABLET | Freq: Two times a day (BID) | ORAL | Status: DC
  Administered 2011-12-31 – 2012-01-03 (×6): 325 mg via ORAL
  Filled 2011-12-31 (×7): qty 1

## 2011-12-31 MED ORDER — SODIUM CHLORIDE 0.9 % IV SOLN
INTRAVENOUS | Status: DC
  Administered 2011-12-31 – 2012-01-01 (×2): via INTRAVENOUS

## 2011-12-31 MED ORDER — HYDROCODONE-ACETAMINOPHEN 5-325 MG PO TABS: 1.0000 | ORAL_TABLET | ORAL | Status: DC | PRN

## 2011-12-31 MED ORDER — ACETAMINOPHEN 650 MG RE SUPP: 650.0000 mg | Freq: Four times a day (QID) | RECTAL | Status: DC | PRN

## 2011-12-31 MED ORDER — CALCIUM CARBONATE-VITAMIN D 500-200 MG-UNIT PO TABS
1.0000 | ORAL_TABLET | Freq: Two times a day (BID) | ORAL | Status: DC
  Administered 2011-12-31 – 2012-01-03 (×6): 1 via ORAL
  Filled 2011-12-31 (×7): qty 1

## 2011-12-31 MED ORDER — LEVOTHYROXINE SODIUM 100 MCG PO TABS
100.0000 ug | ORAL_TABLET | Freq: Every day | ORAL | Status: DC
  Administered 2011-12-31 – 2012-01-03 (×4): 100 ug via ORAL
  Filled 2011-12-31 (×4): qty 1

## 2011-12-31 MED ORDER — ADULT MULTIVITAMIN W/MINERALS CH
1.0000 | ORAL_TABLET | Freq: Every day | ORAL | Status: DC
  Administered 2011-12-31 – 2012-01-03 (×4): 1 via ORAL
  Filled 2011-12-31 (×4): qty 1

## 2011-12-31 MED ORDER — ATENOLOL 25 MG PO TABS
25.0000 mg | ORAL_TABLET | Freq: Every day | ORAL | Status: DC
Start: 2011-12-31 — End: 2012-01-01
  Administered 2011-12-31 – 2012-01-01 (×2): 25 mg via ORAL
  Filled 2011-12-31 (×2): qty 1

## 2011-12-31 MED ORDER — SODIUM CHLORIDE 0.9 % IV BOLUS (SEPSIS)
500.0000 mL | Freq: Once | INTRAVENOUS | Status: AC
  Administered 2011-12-31: 500 mL via INTRAVENOUS

## 2011-12-31 NOTE — ED Notes (Signed)
Pt's son Chrissie Noa [phone]

## 2011-12-31 NOTE — ED Notes (Signed)
Pt was found beside her bed at home on her left side.  It is unclear how long she had been down. (Son now states that he last saw her at 6pm on Saturday.) She was laying in urine, feces, and vomit.  She has abrasions on her left face, shoulder, hip.  Hx of left hip replacement surgery and right pelvis fx surgery.  She has dementia, but apparently lives by herself with daytime caregivers.  There were no caregivers on Sunday at all.  The neighbor got worried when she saw the newspaper had not been picked up.  It appears that she has feces on her mouth.  She is alert but not oriented to time.

## 2011-12-31 NOTE — H&P (Signed)
Hospital Admission Note Date: 12/31/2011  PCP: [name], MD, MD  Chief Complaint: Found down more 48 hours by family  History of Present Illness: This is an 78 year old female with past medical history of A. fib on Coumadin, with a remote history of left hip replacement secondary to a fall post also hypothyroidism and hypertension that comes in for syncope as per family members last time they saw her wasn't Saturday night. They saw her again this morning, she was found by her caregiver at 8:30. As per family members she seems more confused. She has a stronger smell of urine. She was incontinent of urine and feces when they found her. She also had feces in her mouth. They relate that she was not complaining of any weakness in her extremities but was complaining [place] severe pain in her left hip. She relates her head hurts. She relates no shortness of breath chest pain nausea or vomiting burning when she urinates. No diarrhea.  Allergies: Review of patient's allergies indicates no known allergies. Past Medical History  Diagnosis Date  . Myocardial infarction   . Hypothyroidism   . Anemia   . Hypertension   . Closed left hip fracture   . Arthritis   . DEMENTIA    Prior to Admission medications   Medication Sig Start Date End Date Taking? Authorizing Provider  atenolol (TENORMIN) 25 MG tablet Take 25 mg by mouth daily.   Yes Historical Provider, MD  Calcium Carbonate-Vitamin D (OYSTER SHELL CALCIUM 500 + D PO) Take 1 tablet by mouth daily.   Yes Historical Provider, MD  digoxin (LANOXIN) 0.125 MG tablet Take 125 mcg by mouth daily.   Yes Historical Provider, MD  ferrous sulfate 325 (65 FE) MG tablet Take 325 mg by mouth 2 (two) times daily.   Yes Historical Provider, MD  furosemide (LASIX) 20 MG tablet Take 20 mg by mouth daily.   Yes Historical Provider, MD  levothyroxine (SYNTHROID, LEVOTHROID) 100 MCG tablet Take 100 mcg by mouth daily.   Yes Historical Provider, MD  Multiple Vitamin  (MULITIVITAMIN WITH MINERALS) TABS Take 1 tablet by mouth daily.   Yes Historical Provider, MD  Vitamin D, Cholecalciferol, 400 UNITS CHEW Chew 400 Units by mouth 2 (two) times daily.   Yes Historical Provider, MD  warfarin (COUMADIN) 1 MG tablet Take 1 mg by mouth daily. Takes 1mg  daily except on Friday 0.5mg .   Yes Historical Provider, MD   Past Surgical History  Procedure Date  . Appendectomy   . Cataract extraction, bilateral   . Right rotator cuff repair   . Screw to left hip fracture    History reviewed. No pertinent family history. History   Social History  . Marital Status: Widowed    Spouse Name: N/A    Number of Children: N/A  . Years of Education: N/A   Occupational History  . Not on file.   Social History Main Topics  . Smoking status: Never Smoker   . Smokeless tobacco: Never Used  . Alcohol Use: No  . Drug Use: No  . Sexually Active: No   Other Topics Concern  . Not on file   Social History Narrative  . No narrative on file    REVIEW OF SYSTEMS:  Constitutional:  No weight loss, night sweats, Fevers, chills, fatigue.  HEENT:  , Difficulty swallowing,Tooth/dental problems,Sore throat,  No sneezing, itching, ear ache, nasal congestion, post nasal drip,  Cardio-vascular:  No chest pain, Orthopnea, PND, swelling in lower extremities, anasarca,  dizziness, palpitations  GI:  No heartburn, indigestion, abdominal pain, nausea, vomiting, diarrhea, change in bowel habits, loss of appetite  Resp:  No shortness of breath with exertion or at rest. No excess mucus, no productive cough, No non-productive cough, No coughing up of blood.No change in color of mucus.No wheezing.No chest wall deformity  Skin:  no rash or lesions.  GU:  no dysuria, change in color of urine, no urgency or frequency. No flank pain.  Musculoskeletal:  No joint pain or swelling. No decreased range of motion. No back pain.  Psych:  No change in mood or affect. No depression or anxiety. No  memory loss.   Physical Exam: Filed Vitals:   12/31/11 1540  BP: 134/77  Pulse: 104  Temp: 98.7 F (37.1 C)  TempSrc: Oral  Resp: 22  SpO2: 99%   No intake or output data in the 24 hours ending 12/31/11 1632 BP 134/77  Pulse 104  Temp(Src) 98.7 F (37.1 C) (Oral)  Resp 22  SpO2 99%  General Appearance:    Alert, cooperative, no distress, appears stated age  Head:    Normocephalic, without obvious abnormality, bruise on her left upper eyebrow no open wounds.   Eyes:    PERRL, conjunctiva/corneas clear, EOM's intact, fundi    benign, both eyes  Ears:    Normal TM's and external ear canals, both ears  Nose:   Nares normal, septum midline, mucosa normal, no drainage    or sinus tenderness  Throat:   dry lips mucosa and tongue. teeth and gums normal  Neck:   Supple, symmetrical, trachea midline, no adenopathy;    thyroid:  no enlargement/tenderness/nodules; no carotid   bruit or JVD  Back:     Symmetric, no curvature, ROM normal, no CVA tenderness  Lungs:     Clear to auscultation bilaterally, respirations unlabored  Chest Wall:    No tenderness or deformity   Heart:    Regular rate and rhythm, S1 and S2 normal, no murmur, rub   or gallop  Breast Exam:    No tenderness, masses, or nipple abnormality  Abdomen:     Soft, non-tender, bowel sounds active all four quadrants,    no masses, no organomegaly        Extremities:   redness on her left hip with a small knot exquisitely tender to palpation external rotation of the hip is not painful heard knee is painful to palpation and has a medial and redness on the left knee   Pulses:   2+ and symmetric all extremities  Skin:   Skin color, texture, turgor normal, no rashes or lesions  Lymph nodes:   Cervical, supraclavicular, and axillary nodes normal  Neurologic:   CNII-XII intact, normal strength, sensation and reflexes    throughout   Lab results:  Basename 12/31/11 1300  NA 142  K 3.7  CL 102  CO2 25  GLUCOSE 96  BUN 19    CREATININE 0.72  CALCIUM 9.6  MG --  PHOS --    Basename 12/31/11 1300  AST 55*  ALT 20  ALKPHOS 77  BILITOT 1.2  PROT 7.2  ALBUMIN 3.9    Basename 12/31/11 1300  LIPASE 36  AMYLASE --    Basename 12/31/11 1300  WBC 9.8  NEUTROABS 7.8*  HGB 16.0*  HCT 45.5  MCV 88.9  PLT 97*    Basename 12/31/11 1300  CKTOTAL 1307*  CKMB 19.2*  CKMBINDEX --  TROPONINI <0.30   No components  found with this basename: POCBNP:3 No results found for this basename: DDIMER:2 in the last 72 hours No results found for this basename: HGBA1C:2 in the last 72 hours No results found for this basename: CHOL:2,HDL:2,LDLCALC:2,TRIG:2,CHOLHDL:2,LDLDIRECT:2 in the last 72 hours No results found for this basename: TSH,T4TOTAL,FREET3,T3FREE,THYROIDAB in the last 72 hours No results found for this basename: VITAMINB12:2,FOLATE:2,FERRITIN:2,TIBC:2,IRON:2,RETICCTPCT:2 in the last 72 hours Imaging results:  Dg Chest 2 View  12/31/2011  *RADIOLOGY REPORT*  Clinical Data: Larey Seat.  CHEST - 2 VIEW  Comparison: None  Findings: The cardiac silhouette, mediastinal and hilar contours are within normal limits.  The lungs are clear.  The bony thorax is intact.  No obvious spinal or rib fractures.  IMPRESSION: No acute cardiopulmonary findings and intact bony thorax.  Original Report Authenticated By: P. Loralie Champagne, M.D.   Dg Hip Complete Left  12/31/2011  *RADIOLOGY REPORT*  Clinical Data: Larey Seat.  Left hip pain.  LEFT HIP - COMPLETE 2+ VIEW  Comparison: None  Findings: The intramedullary rod and dynamic hip screw on the left thyroid and good position without complicating features.  Remote healed intertrochanteric fracture is noted with no complicating features.  The pubic symphysis and SI joints are intact.  No definite pelvic fractures.  The right hip demonstrates degenerative changes but no acute abnormality.  IMPRESSION:  1.  Remote surgical changes involving the left hip without acute fracture. 2.  No definite  pelvic fractures.  Original Report Authenticated By: P. Loralie Champagne, M.D.   Ct Head Wo Contrast  12/31/2011  *RADIOLOGY REPORT*  Clinical Data:  Fall  CT HEAD WITHOUT CONTRAST CT CERVICAL SPINE WITHOUT CONTRAST  Technique:  Multidetector CT imaging of the head and cervical spine was performed following the standard protocol without intravenous contrast.  Multiplanar CT image reconstructions of the cervical spine were also generated.  Comparison:  None  CT HEAD  Findings: Prior right occipital craniotomy for unknown procedure.  Generalized atrophy.  Chronic microvascular ischemia in the white matter.  Negative for acute hemorrhage or infarct.  No mass lesion. Paranasal sinuses are clear.  IMPRESSION: Atrophy and chronic microvascular ischemia.  No acute abnormality.  CT CERVICAL SPINE  Findings: Multilevel advanced disc degeneration and spondylosis. This is present from C2-C7.  Moderate facet degeneration is also present.  2 mm anterior slip C3-4.  Negative for acute fracture.  No mass lesion.  IMPRESSION: Negative for fracture.  Advanced cervical spondylosis.  Original Report Authenticated By: Camelia Phenes, M.D.   Ct Cervical Spine Wo Contrast  12/31/2011  *RADIOLOGY REPORT*  Clinical Data:  Fall  CT HEAD WITHOUT CONTRAST CT CERVICAL SPINE WITHOUT CONTRAST  Technique:  Multidetector CT imaging of the head and cervical spine was performed following the standard protocol without intravenous contrast.  Multiplanar CT image reconstructions of the cervical spine were also generated.  Comparison:  None  CT HEAD  Findings: Prior right occipital craniotomy for unknown procedure.  Generalized atrophy.  Chronic microvascular ischemia in the white matter.  Negative for acute hemorrhage or infarct.  No mass lesion. Paranasal sinuses are clear.  IMPRESSION: Atrophy and chronic microvascular ischemia.  No acute abnormality.  CT CERVICAL SPINE  Findings: Multilevel advanced disc degeneration and spondylosis. This is  present from C2-C7.  Moderate facet degeneration is also present.  2 mm anterior slip C3-4.  Negative for acute fracture.  No mass lesion.  IMPRESSION: Negative for fracture.  Advanced cervical spondylosis.  Original Report Authenticated By: Camelia Phenes, M.D.   Dg Shoulder Left  12/31/2011  *RADIOLOGY REPORT*  Clinical Data: Larey Seat.  Left shoulder pain.  LEFT SHOULDER - 2+ VIEW  Comparison: None  Findings: The joint spaces are maintained.  Mild degenerative changes.  No acute fracture.  IMPRESSION: No fracture or dislocation.  Original Report Authenticated By: P. Loralie Champagne, M.D.   Dg Knee Complete 4 Views Right  12/31/2011  *RADIOLOGY REPORT*  Clinical Data: Larey Seat.  Right knee pain.  RIGHT KNEE - COMPLETE 4+ VIEW  Comparison: None  Findings: There are severe tricompartmental degenerative changes and chondrocalcinosis.  No acute fracture or joint effusion.  IMPRESSION:  1.  Severe tricompartmental degenerative changes and chondrocalcinosis. 2.  No acute fracture or joint effusion.  Original Report Authenticated By: P. Loralie Champagne, M.D.   Other results: EKG: E. regular rate and rhythm with a heart rate of 112 with some ST segment depression little 1 mm on week 2 and T wave inversion on V3 and 4 normal axis and normal intervals..   Patient Active Hospital Problem List: 1.Syncope and collapse (12/31/2011) Patient seems nonfocal physical exam neurological exam is somewhat limited secondary to pain. We'll admit to the telemetry unit get a 2-D echo cycle her cardiac enzymes CT scan of the head was done that showed no acute abnormality.he really seemed to show serial to white blood cells with many bacteria, we'll send for urine culture start her on Rocephin empirically. Will also monitor her for seizures. As she was incontinent. She has no history of seizures. I'd think at this time the zone low probability of her having had a stroke as she seems nonfocal. Her electrolytes are within normal limits. We'll  start her on IV fluids gently as she does seem slightly dehydrated.  We'll get physical therapy evaluate the patient, at this time I have spoken to the family that this position for home is not safe for her. As she has fallen several times. On the nursing home placement permanently.   2.UTI (lower urinary tract infection) (12/31/2011)  we'll get a urine culture start her on Rocephin empirically, we'll also get blood cultures. And make changes depending on results of blood cultures  3.Atrial fibrillation (12/31/2011)  we'll continue her home dose of Coreg and digoxin.  I have discussed extensively with the family the use of Coumadin.  I have explained the risk and benefits of being on Coumadin to the family. As she has fallen several times within the last couple years they have agreed to take her off Coumadin.  4.HTN (hypertension) (12/31/2011) -blood pressure is currently well controlled we'll continue current treatment.    Code Status: Family Communication:   Marinda Elk M.D. [facility] [phone] 12/31/2011, 4:32 PM

## 2011-12-31 NOTE — ED Notes (Signed)
ZOX:[id number]<AV> Expected date:12/31/11<BR> Expected time:10:26 AM<BR> Means of arrival:Ambulance<BR> Comments:<BR> M12. Fall. 78 yo f. No neck/back. 5 min

## 2011-12-31 NOTE — ED Provider Notes (Signed)
History     CSN: [id number]  Arrival date & time 12/31/11  1055   First MD Initiated Contact with Patient 12/31/11 1122      Chief Complaint  Patient presents with  . Fall    (Consider location/radiation/quality/duration/timing/severity/associated sxs/prior treatment) HPI Comments: Patient presents via EMS after being found down at home since at least Saturday night. She was laying in urine and feces. She has abrasions to her face shoulder and hip. Patient has a history dementia is oriented x1. She was by herself and has a caregiver 6 days a week according to her son. The caregiver is not work on Sundays. Neighbor called EMS when she noticed the patient has not picked up her paper. Patient denies any pain.  No headache, chest pain or abdominal pain.  Patient's son states she has a history of hip replacements and he is worried about the stability.  The history is provided by the patient.    Past Medical History  Diagnosis Date  . Myocardial infarction   . Hypothyroidism   . Anemia   . Hypertension   . Closed left hip fracture   . Arthritis   . DEMENTIA     Past Surgical History  Procedure Date  . Appendectomy   . Cataract extraction, bilateral   . Right rotator cuff repair   . Screw to left hip fracture     History reviewed. No pertinent family history.  History  Substance Use Topics  . Smoking status: Never Smoker   . Smokeless tobacco: Never Used  . Alcohol Use: No    OB History    Grav Para Term Preterm Abortions TAB SAB Ect Mult Living                  Review of Systems  Unable to perform ROS: Dementia    Allergies  Review of patient's allergies indicates no known allergies.  Home Medications   No current outpatient prescriptions on file.  BP 151/96  Pulse 102  Temp(Src) 97.4 F (36.3 C) (Oral)  Resp 20  SpO2 98%  Physical Exam  Constitutional: No distress.  HENT:  Head: Normocephalic and atraumatic.  Mouth/Throat: Oropharynx is clear and  moist.       Is a black substance the patient's lips and tongue as well as fingernails. Blood versus feces  Eyes: Conjunctivae and EOM are normal. Pupils are equal, round, and reactive to light.  Neck: Normal range of motion. Neck supple.       No C-spine pain, step-off or deformity  Cardiovascular: Normal rate, regular rhythm and normal heart sounds.   Pulmonary/Chest: Effort normal and breath sounds normal. No respiratory distress.  Abdominal: Soft. There is no tenderness. There is no rebound and no guarding.  Musculoskeletal: She exhibits no edema and no tenderness.       Abrasion left shoulder, left hip and right knee  Neurological: She is alert. No cranial nerve deficit.       Oriented x1, no cranial nerve deficit, 5/ 5 strength throughout  Skin: Skin is warm.    ED Course  Procedures (including critical care time)  Labs Reviewed  CBC - Abnormal; Notable for the following:    RBC 5.12 (*)    Hemoglobin 16.0 (*)    Platelets 97 (*)    All other components within normal limits  DIFFERENTIAL - Abnormal; Notable for the following:    Neutrophils Relative 79 (*)    Neutro Abs 7.8 (*)    Lymphocytes  Relative 11 (*)    All other components within normal limits  COMPREHENSIVE METABOLIC PANEL - Abnormal; Notable for the following:    AST 55 (*)    GFR calc non Af Amer 75 (*)    GFR calc Af Amer 87 (*)    All other components within normal limits  URINALYSIS, WITH MICROSCOPIC - Abnormal; Notable for the following:    APPearance CLOUDY (*)    Hgb urine dipstick LARGE (*)    Ketones, ur >80 (*)    Protein, ur 100 (*)    Bacteria, UA MANY (*)    Casts HYALINE CASTS (*)    All other components within normal limits  PROTIME-INR - Abnormal; Notable for the following:    Prothrombin Time 29.3 (*)    INR 2.72 (*)    All other components within normal limits  CARDIAC PANEL(CRET KIN+CKTOT+MB+TROPI) - Abnormal; Notable for the following:    Total CK 1307 (*)    CK, MB 19.2 (*)    All  other components within normal limits  DIGOXIN LEVEL - Abnormal; Notable for the following:    Digoxin Level 0.6 (*)    All other components within normal limits  LIPASE, BLOOD   Dg Chest 2 View  12/31/2011  *RADIOLOGY REPORT*  Clinical Data: Larey Seat.  CHEST - 2 VIEW  Comparison: None  Findings: The cardiac silhouette, mediastinal and hilar contours are within normal limits.  The lungs are clear.  The bony thorax is intact.  No obvious spinal or rib fractures.  IMPRESSION: No acute cardiopulmonary findings and intact bony thorax.  Original Report Authenticated By: P. Loralie Champagne, M.D.   Dg Hip Complete Left  12/31/2011  *RADIOLOGY REPORT*  Clinical Data: Larey Seat.  Left hip pain.  LEFT HIP - COMPLETE 2+ VIEW  Comparison: None  Findings: The intramedullary rod and dynamic hip screw on the left thyroid and good position without complicating features.  Remote healed intertrochanteric fracture is noted with no complicating features.  The pubic symphysis and SI joints are intact.  No definite pelvic fractures.  The right hip demonstrates degenerative changes but no acute abnormality.  IMPRESSION:  1.  Remote surgical changes involving the left hip without acute fracture. 2.  No definite pelvic fractures.  Original Report Authenticated By: P. Loralie Champagne, M.D.   Ct Head Wo Contrast  12/31/2011  *RADIOLOGY REPORT*  Clinical Data:  Fall  CT HEAD WITHOUT CONTRAST CT CERVICAL SPINE WITHOUT CONTRAST  Technique:  Multidetector CT imaging of the head and cervical spine was performed following the standard protocol without intravenous contrast.  Multiplanar CT image reconstructions of the cervical spine were also generated.  Comparison:  None  CT HEAD  Findings: Prior right occipital craniotomy for unknown procedure.  Generalized atrophy.  Chronic microvascular ischemia in the white matter.  Negative for acute hemorrhage or infarct.  No mass lesion. Paranasal sinuses are clear.  IMPRESSION: Atrophy and chronic  microvascular ischemia.  No acute abnormality.  CT CERVICAL SPINE  Findings: Multilevel advanced disc degeneration and spondylosis. This is present from C2-C7.  Moderate facet degeneration is also present.  2 mm anterior slip C3-4.  Negative for acute fracture.  No mass lesion.  IMPRESSION: Negative for fracture.  Advanced cervical spondylosis.  Original Report Authenticated By: Camelia Phenes, M.D.   Ct Cervical Spine Wo Contrast  12/31/2011  *RADIOLOGY REPORT*  Clinical Data:  Fall  CT HEAD WITHOUT CONTRAST CT CERVICAL SPINE WITHOUT CONTRAST  Technique:  Multidetector CT imaging of  the head and cervical spine was performed following the standard protocol without intravenous contrast.  Multiplanar CT image reconstructions of the cervical spine were also generated.  Comparison:  None  CT HEAD  Findings: Prior right occipital craniotomy for unknown procedure.  Generalized atrophy.  Chronic microvascular ischemia in the white matter.  Negative for acute hemorrhage or infarct.  No mass lesion. Paranasal sinuses are clear.  IMPRESSION: Atrophy and chronic microvascular ischemia.  No acute abnormality.  CT CERVICAL SPINE  Findings: Multilevel advanced disc degeneration and spondylosis. This is present from C2-C7.  Moderate facet degeneration is also present.  2 mm anterior slip C3-4.  Negative for acute fracture.  No mass lesion.  IMPRESSION: Negative for fracture.  Advanced cervical spondylosis.  Original Report Authenticated By: Camelia Phenes, M.D.   Dg Shoulder Left  12/31/2011  *RADIOLOGY REPORT*  Clinical Data: Larey Seat.  Left shoulder pain.  LEFT SHOULDER - 2+ VIEW  Comparison: None  Findings: The joint spaces are maintained.  Mild degenerative changes.  No acute fracture.  IMPRESSION: No fracture or dislocation.  Original Report Authenticated By: P. Loralie Champagne, M.D.   Dg Knee Complete 4 Views Right  12/31/2011  *RADIOLOGY REPORT*  Clinical Data: Larey Seat.  Right knee pain.  RIGHT KNEE - COMPLETE 4+ VIEW   Comparison: None  Findings: There are severe tricompartmental degenerative changes and chondrocalcinosis.  No acute fracture or joint effusion.  IMPRESSION:  1.  Severe tricompartmental degenerative changes and chondrocalcinosis. 2.  No acute fracture or joint effusion.  Original Report Authenticated By: P. Loralie Champagne, M.D.     1. Fall   2. Rhabdomyolysis   3. Dehydration       MDM  Presumed fall at home prolonged downtime.  Also consider syncope, seizure, CVA.  Patient smells of urine and is disoriented.  No focal neuro deficits.  Frequent falls, on coumadin, unsafe home situation discussed with son who is agreeable to SNF placement.  Labs suggest dehydation, possible rhabdomyolysis, UTI.     Date: 12/31/2011  Rate: 112  Rhythm: atrial fibrillation  QRS Axis: normal  Intervals: normal  ST/T Wave abnormalities: normal  Conduction Disutrbances:none  Narrative Interpretation:   Old EKG Reviewed: none available    Glynn Octave, MD 12/31/11 [phone]

## 2011-12-31 NOTE — ED Notes (Signed)
Christy from lab called with critical CKMB 19.2.  Dr. Manus Gunning made aware

## 2012-01-01 DIAGNOSIS — M6282 Rhabdomyolysis: Secondary | ICD-10-CM | POA: Diagnosis present

## 2012-01-01 LAB — CARDIAC PANEL(CRET KIN+CKTOT+MB+TROPI)
CK, MB: 8.8 ng/mL (ref 0.3–4.0)
Relative Index: 1.5 (ref 0.0–2.5)
Total CK: 616 U/L — ABNORMAL HIGH (ref 7–177)
Troponin I: <0.3 ng/mL (ref <0.30)
Troponin I: <0.3 ng/mL (ref <0.30)

## 2012-01-01 LAB — COMPREHENSIVE METABOLIC PANEL
Albumin: 3.2 g/dL — ABNORMAL LOW (ref 3.5–5.2)
Alkaline Phosphatase: 61 U/L (ref 39–117)
BUN: 20 mg/dL (ref 6–23)
Calcium: 8.9 mg/dL (ref 8.4–10.5)
Potassium: 3.6 mEq/L (ref 3.5–5.1)
Total Protein: 5.9 g/dL — ABNORMAL LOW (ref 6.0–8.3)

## 2012-01-01 LAB — HEMOGLOBIN A1C: Hgb A1c MFr Bld: 5.6 % (ref <5.7)

## 2012-01-01 LAB — TSH: TSH: 0.86 u[IU]/mL (ref 0.350–4.500)

## 2012-01-01 LAB — CBC
HCT: 40.1 % (ref 36.0–46.0)
Hemoglobin: 13.8 g/dL (ref 12.0–15.0)
MCV: 90.3 fL (ref 78.0–100.0)
RBC: 4.44 MIL/uL (ref 3.87–5.11)
WBC: 7.5 10*3/uL (ref 4.0–10.5)

## 2012-01-01 MED ORDER — FUROSEMIDE 20 MG PO TABS
20.0000 mg | ORAL_TABLET | Freq: Every day | ORAL | Status: DC
  Administered 2012-01-01 – 2012-01-03 (×3): 20 mg via ORAL
  Filled 2012-01-01 (×3): qty 1

## 2012-01-01 MED ORDER — ATENOLOL 50 MG PO TABS
50.0000 mg | ORAL_TABLET | Freq: Every day | ORAL | Status: DC
  Administered 2012-01-02 – 2012-01-03 (×2): 50 mg via ORAL
  Filled 2012-01-01 (×2): qty 1

## 2012-01-01 NOTE — Progress Notes (Signed)
2D Echo completed.  [name], RDCS 

## 2012-01-01 NOTE — Progress Notes (Signed)
Subjective:  Patient has no complains. Echo done.  Objective: Filed Vitals:   12/31/11 2044 12/31/11 2045 12/31/11 2046 01/01/12 0656  BP: 110/77 131/87 134/82 119/74  Pulse: 120 127 142 113  Temp: 98 F (36.7 C)   97.6 F (36.4 C)  TempSrc: Oral   Oral  Resp: 20   19  Height:      Weight:    54.522 kg (120 lb 3.2 oz)  SpO2: 99%   94%   Weight change:   Intake/Output Summary (Last 24 hours) at 01/01/12 1239 Last data filed at 01/01/12 1100  Gross per 24 hour  Intake      0 ml  Output    102 ml  Net   -102 ml    General: Alert, awake, oriented x3, in no acute distress.  HEENT: No bruits, no goiter.  Heart: Regular rate and rhythm, without murmurs, rubs, gallops.  Lungs: Crackles left side, bilateral air movement.  Abdomen: Soft, nontender, nondistended, positive bowel sounds.  Neuro: Grossly intact, nonfocal.   Lab Results:  Basename 01/01/12 0210 12/31/11 1300  NA 143 142  K 3.6 3.7  CL 108 102  CO2 23 25  GLUCOSE 90 96  BUN 20 19  CREATININE 0.74 0.72  CALCIUM 8.9 9.6  MG -- --  PHOS -- --    Basename 01/01/12 0210 12/31/11 1300  AST 42* 55*  ALT 17 20  ALKPHOS 61 77  BILITOT 0.9 1.2  PROT 5.9* 7.2  ALBUMIN 3.2* 3.9    Basename 12/31/11 1300  LIPASE 36  AMYLASE --    Basename 01/01/12 0210 12/31/11 1300  WBC 7.5 9.8  NEUTROABS -- 7.8*  HGB 13.8 16.0*  HCT 40.1 45.5  MCV 90.3 88.9  PLT 81* 97*    Basename 01/01/12 1015 01/01/12 0210 12/31/11 1900  CKTOTAL 441* 616* 962*  CKMB 6.7* 8.8* 13.9*  CKMBINDEX -- -- --  TROPONINI <0.30 <0.30 <0.30   No components found with this basename: POCBNP:3 No results found for this basename: DDIMER:2 in the last 72 hours  Basename 12/31/11 1900  HGBA1C 5.6   No results found for this basename: CHOL:2,HDL:2,LDLCALC:2,TRIG:2,CHOLHDL:2,LDLDIRECT:2 in the last 72 hours  Basename 12/31/11 1900  TSH 0.860  T4TOTAL --  T3FREE --  THYROIDAB --   No results found for this basename:  VITAMINB12:2,FOLATE:2,FERRITIN:2,TIBC:2,IRON:2,RETICCTPCT:2 in the last 72 hours  Micro Results: No results found for this or any previous visit (from the past 240 hour(s)).  Studies/Results: Dg Chest 2 View  12/31/2011  *RADIOLOGY REPORT*  Clinical Data: Larey Seat.  CHEST - 2 VIEW  Comparison: None  Findings: The cardiac silhouette, mediastinal and hilar contours are within normal limits.  The lungs are clear.  The bony thorax is intact.  No obvious spinal or rib fractures.  IMPRESSION: No acute cardiopulmonary findings and intact bony thorax.  Original Report Authenticated By: P. Loralie Champagne, M.D.   Dg Hip Complete Left  12/31/2011  *RADIOLOGY REPORT*  Clinical Data: Larey Seat.  Left hip pain.  LEFT HIP - COMPLETE 2+ VIEW  Comparison: None  Findings: The intramedullary rod and dynamic hip screw on the left thyroid and good position without complicating features.  Remote healed intertrochanteric fracture is noted with no complicating features.  The pubic symphysis and SI joints are intact.  No definite pelvic fractures.  The right hip demonstrates degenerative changes but no acute abnormality.  IMPRESSION:  1.  Remote surgical changes involving the left hip without acute fracture. 2.  No definite pelvic  fractures.  Original Report Authenticated By: P. Loralie Champagne, M.D.   Ct Head Wo Contrast  12/31/2011  *RADIOLOGY REPORT*  Clinical Data:  Fall  CT HEAD WITHOUT CONTRAST CT CERVICAL SPINE WITHOUT CONTRAST  Technique:  Multidetector CT imaging of the head and cervical spine was performed following the standard protocol without intravenous contrast.  Multiplanar CT image reconstructions of the cervical spine were also generated.  Comparison:  None  CT HEAD  Findings: Prior right occipital craniotomy for unknown procedure.  Generalized atrophy.  Chronic microvascular ischemia in the white matter.  Negative for acute hemorrhage or infarct.  No mass lesion. Paranasal sinuses are clear.  IMPRESSION: Atrophy and  chronic microvascular ischemia.  No acute abnormality.  CT CERVICAL SPINE  Findings: Multilevel advanced disc degeneration and spondylosis. This is present from C2-C7.  Moderate facet degeneration is also present.  2 mm anterior slip C3-4.  Negative for acute fracture.  No mass lesion.  IMPRESSION: Negative for fracture.  Advanced cervical spondylosis.  Original Report Authenticated By: Camelia Phenes, M.D.   Ct Cervical Spine Wo Contrast  12/31/2011  *RADIOLOGY REPORT*  Clinical Data:  Fall  CT HEAD WITHOUT CONTRAST CT CERVICAL SPINE WITHOUT CONTRAST  Technique:  Multidetector CT imaging of the head and cervical spine was performed following the standard protocol without intravenous contrast.  Multiplanar CT image reconstructions of the cervical spine were also generated.  Comparison:  None  CT HEAD  Findings: Prior right occipital craniotomy for unknown procedure.  Generalized atrophy.  Chronic microvascular ischemia in the white matter.  Negative for acute hemorrhage or infarct.  No mass lesion. Paranasal sinuses are clear.  IMPRESSION: Atrophy and chronic microvascular ischemia.  No acute abnormality.  CT CERVICAL SPINE  Findings: Multilevel advanced disc degeneration and spondylosis. This is present from C2-C7.  Moderate facet degeneration is also present.  2 mm anterior slip C3-4.  Negative for acute fracture.  No mass lesion.  IMPRESSION: Negative for fracture.  Advanced cervical spondylosis.  Original Report Authenticated By: Camelia Phenes, M.D.   Dg Shoulder Left  12/31/2011  *RADIOLOGY REPORT*  Clinical Data: Larey Seat.  Left shoulder pain.  LEFT SHOULDER - 2+ VIEW  Comparison: None  Findings: The joint spaces are maintained.  Mild degenerative changes.  No acute fracture.  IMPRESSION: No fracture or dislocation.  Original Report Authenticated By: P. Loralie Champagne, M.D.   Dg Knee Complete 4 Views Right  12/31/2011  *RADIOLOGY REPORT*  Clinical Data: Larey Seat.  Right knee pain.  RIGHT KNEE - COMPLETE 4+ VIEW   Comparison: None  Findings: There are severe tricompartmental degenerative changes and chondrocalcinosis.  No acute fracture or joint effusion.  IMPRESSION:  1.  Severe tricompartmental degenerative changes and chondrocalcinosis. 2.  No acute fracture or joint effusion.  Original Report Authenticated By: P. Loralie Champagne, M.D.    Medications: I have reviewed the patient's current medications.  Assessment and plan:  1. Syncope and collapse 2-d echo, Cardiac enzymes negative x3. CT head no bleed. Urine culture pending. Physical therapy consult pending.  2.UTI (lower urinary tract infection) Continue rocephin, urine cultures pending. Afebrile.   3.Atrial fibrillation Tachycardic, increase atenolol. Not a candidate for coumadin, due to recurrent falls, d/w family.  4.HTN (hypertension)  stable.  Rhabdomyolysis: Continue IV fludis, CK trending down.   6.Disposotion: Will need SNF. Social worker working on transition.   LOS: 1 day   Marinda Elk M.D. Pager: [phone] [facility] 01/01/2012, 12:39 PM

## 2012-01-01 NOTE — Evaluation (Addendum)
Clinical/Bedside Swallow Evaluation Patient Details  Name: [name] MRN: [id number] DOB: 1922/10/03 Today's Date: 01/01/2012  Past Medical History:  Past Medical History  Diagnosis Date  . Myocardial infarction   . Hypothyroidism   . Anemia   . Hypertension   . Closed left hip fracture   . Arthritis   . DEMENTIA    Past Surgical History:  Past Surgical History  Procedure Date  . Appendectomy   . Cataract extraction, bilateral   . Right rotator cuff repair   . Screw to left hip fracture    HPI:  Pt is an 78 year old female adm to [facility] 12/31/11 after fall with syncope, down for approx 48 hours, found with feces in mouth. Pt lives alone and has neighbor who comes over to cook for her.  Family reports pt possibly regurgitated when fell.  Pt does not recall falling.  Pt also has h/o dementia.  Pt denies h/o dysphagia or reflux.     Dg Chest 2 View 12/31/2011  *RADIOLOGY REPORT*  Clinical Data: Larey Seat.  CHEST - 2 VIEW  Comparison: None  Findings: The cardiac silhouette, mediastinal and hilar contours are within normal limits.  The lungs are clear.  The bony thorax is intact.  No obvious spinal or rib fractures.  IMPRESSION: No acute cardiopulmonary findings and intact bony thorax.  Original Report Authenticated By: P. Loralie Champagne, M.D.   Ct Head Wo Contrast 12/31/2011  *RADIOLOGY REPORT*  Clinical Data:  Fall  CT HEAD WITHOUT CONTRAST CT CERVICAL SPINE WITHOUT CONTRAST  Technique:  Multidetector CT imaging of the head and cervical spine was performed following the standard protocol without intravenous contrast.  Multiplanar CT image reconstructions of the cervical spine were also generated.  Comparison:  None  CT HEAD  Findings: Prior right occipital craniotomy for unknown procedure.  Generalized atrophy.  Chronic microvascular ischemia in the white matter.  Negative for acute hemorrhage or infarct.  No mass lesion. Paranasal sinuses are clear.  IMPRESSION: Atrophy and chronic microvascular  ischemia.  No acute abnormality.  CT CERVICAL SPINE  Findings: Multilevel advanced disc degeneration and spondylosis. This is present from C2-C7.  Moderate facet degeneration is also present.  2 mm anterior slip C3-4.  Negative for acute fracture.  No mass lesion.  IMPRESSION: Negative for fracture.  Advanced cervical spondylosis.  Original Report Authenticated By: Camelia Phenes, M.D.   Dg Shoulder Left  12/31/2011  *RADIOLOGY REPORT*  Clinical Data: Larey Seat.  Left shoulder pain.  LEFT SHOULDER - 2+ VIEW  Comparison: None  Findings: The joint spaces are maintained.  Mild degenerative changes.  No acute fracture.  IMPRESSION: No fracture or dislocation.  Original Report Authenticated By: P. Loralie Champagne, M.D.   Dg Knee Complete 4 Views Right  12/31/2011  *RADIOLOGY REPORT*  Clinical Data: Larey Seat.  Right knee pain.  RIGHT KNEE - COMPLETE 4+ VIEW  Comparison: None  Findings: There are severe tricompartmental degenerative changes and chondrocalcinosis.  No acute fracture or joint effusion.  IMPRESSION:  1.  Severe tricompartmental degenerative changes and chondrocalcinosis. 2.  No acute fracture or joint effusion.  Original Report Authenticated By: P. Loralie Champagne, M.D.  Assessment/Recommendations/Treatment Plan SLP Assessment Clinical Impression Statement: Pt appears with functional swallow, able to self feed.  Timely swallow and clear voice throughout.  Pt reported pain in pharynx with swallow, did not improve after intake.  SLP questions if feces located in oral cavity when pt was discovered down is source of erythmea.  Rec regular/thin diet.  Pt noted to ask the same question a few times during evaluation, suspect due to dementia.  Sister in law reports pt with memory loss PTA, slightly worse currently.  Pt did have delayed cough after intake, verbalizing she has a tickle in her throat which is baseline for her.   Risk for Aspiration: Mild  Swallow Evaluation Recommendations Solid Consistency:  Regular Liquid Consistency: Thin Liquid Administration via: Straw;Cup Medication Administration: Whole meds with liquid Supervision: Patient able to self feed Compensations: Slow rate;Small sips/bites Postural Changes and/or Swallow Maneuvers: Seated upright 90 degrees Oral Care Recommendations: Oral care QID  Treatment Plan Treatment Plan Recommendations: No treatment recommended at this time   Individuals Consulted Consulted and Agree with Results and Recommendations: Patient  General  Date of Onset: 01/01/12 HPI: Pt is an 78 year old female adm to [facility] 12/31/11 after fall with syncope, down for approx 48 hours, found with feces in mouth. Pt lives alone and has neighbor who comes over to cook for her.  Family reports pt possibly regurgitated when fell.  Pt does not recall falling.  Pt also has h/o dementia.  Pt denies h/o dysphagia or reflux.   Type of Study: Bedside swallow evaluation Diet Prior to this Study: NPO Temperature Spikes Noted: No Respiratory Status: Room air Behavior/Cognition: Alert;Cooperative;Pleasant mood Oral Cavity - Dentition: Adequate natural dentition Patient Positioning: Upright in bed Baseline Vocal Quality: Clear Volitional Cough: Strong Volitional Swallow: Able to elicit  Oral Motor/Sensory Function  Overall Oral Motor/Sensory Function: Other (comment) (soft palate red, sore throat, abrasion left cheek) Labial ROM: Within Functional Limits Labial Symmetry: Within Functional Limits Labial Strength: Within Functional Limits Labial Sensation: Within Functional Limits Lingual ROM: Within Functional Limits Lingual Symmetry: Within Functional Limits Lingual Strength: Within Functional Limits Lingual Sensation: Within Functional Limits Facial ROM: Within Functional Limits Facial Symmetry: Within Functional Limits Facial Strength: Within Functional Limits Facial Sensation: Within Functional Limits Velum: Within Functional Limits Mandible: Within  Functional Limits  Consistency Results  Ice Chips Ice chips: Not tested  Thin Liquid Thin Liquid: Within functional limits Presentation: Self Fed;Straw  Nectar Thick Liquid Nectar Thick Liquid: Not tested  Honey Thick Liquid Honey Thick Liquid: Not tested  Puree Puree: Within functional limits Other Comments: icecream  Solid Solid: Within functional limits Presentation: Self Fed Other Comments: graham cracker Donavan Burnet, MS [facility] SLP [phone]   Pt complaining of pain in throat, advised RN.  Educated pt to clinical s/s of aspiration to be aware of given falls, dementia and cervical spine issues.

## 2012-01-02 NOTE — Evaluation (Signed)
Physical Therapy Evaluation Patient Details Name: [name] MRN: [id number] DOB: 1922-03-19 Today's Date: 01/02/2012 Ev II Problem List:  Patient Active Problem List  Diagnoses  . Syncope and collapse  . UTI (lower urinary tract infection)  . Atrial fibrillation  . HTN (hypertension)  . Rhabdomyolysis    Past Medical History:  Past Medical History  Diagnosis Date  . Myocardial infarction   . Hypothyroidism   . Anemia   . Hypertension   . Closed left hip fracture   . Arthritis   . DEMENTIA    Past Surgical History:  Past Surgical History  Procedure Date  . Appendectomy   . Cataract extraction, bilateral   . Right rotator cuff repair   . Screw to left hip fracture     PT Assessment/Plan/Recommendation PT Assessment Clinical Impression Statement: Pt presents with diagnosis of syncope and collapse at home. Pt will benefit from skilled PT in acute setting to improve general strength, activity tolerance, gait and balance in order to maximize independence and safety with functional mobility.  PT Recommendation/Assessment: Patient will need skilled PT in the acute care venue PT Problem List: Decreased strength;Decreased balance;Decreased mobility;Decreased safety awareness;Decreased activity tolerance;Decreased cognition;Decreased knowledge of use of DME Barriers to Discharge: Decreased caregiver support PT Therapy Diagnosis : Difficulty walking;Generalized weakness PT Plan PT Frequency: Min 3X/week PT Treatment/Interventions: DME instruction;Gait training;Functional mobility training;Therapeutic activities;Therapeutic exercise;Balance training;Patient/family education PT Recommendation Recommendations for Other Services: OT consult Follow Up Recommendations: Skilled nursing facility Equipment Recommended: Defer to next venue PT Goals  Acute Rehab PT Goals PT Goal Formulation: With patient Time For Goal Achievement: 7 days Pt will go Supine/Side to Sit: with modified  independence PT Goal: Supine/Side to Sit - Progress: Goal set today Pt will go Sit to Supine/Side: with modified independence PT Goal: Sit to Supine/Side - Progress: Goal set today Pt will go Sit to Stand: with supervision PT Goal: Sit to Stand - Progress: Goal set today Pt will go Stand to Sit: with supervision PT Goal: Stand to Sit - Progress: Goal set today Pt will Ambulate: 51 - 150 feet;with supervision;with least restrictive assistive device PT Goal: Ambulate - Progress: Goal set today  PT Evaluation Precautions/Restrictions  Precautions Precautions: Fall Restrictions Weight Bearing Restrictions: No Prior Functioning  Home Living Lives With: Alone Receives Help From: Friend(s);Family Type of Home: House Home Layout: One level Home Access: Stairs to enter Entrance Stairs-Rails: None Home Adaptive Equipment: Walker - rolling Prior Function Level of Independence: Independent with basic ADLs;Independent with gait;Needs assistance with homemaking Cognition Cognition Arousal/Alertness: Awake/alert Overall Cognitive Status: Impaired Memory: Appears impaired Memory Deficits: Pt could not state her location or reason why she was in hospital.  Orientation Level: Oriented to person;Disoriented to place;Disoriented to time;Disoriented to situation Awareness of Deficits: Decreased awareness of deficits Sensation/Coordination Sensation Light Touch: Appears Intact Coordination Gross Motor Movements are Fluid and Coordinated: Yes Extremity Assessment RLE Assessment RLE Assessment: Within Functional Limits LLE Assessment LLE Assessment: Within Functional Limits Mobility (including Balance) Bed Mobility Bed Mobility: No Transfers Transfers: Yes Sit to Stand: 4: Min assist;From chair/3-in-1;With upper extremity assist Sit to Stand Details (indicate cue type and reason): VCs safety, technique, hand placement. Assist to rise, stabilize Stand to Sit: To chair/3-in-1;With upper  extremity assist;With armrests;4: Min assist Stand to Sit Details: VCs safety technique hand placement. Assist to control descent.  Ambulation/Gait Ambulation/Gait: Yes Ambulation/Gait Assistance: 4: Min assist Ambulation/Gait Assistance Details (indicate cue type and reason): VCs safety, postue, safe use of RW. Fatigued fairly easily-  SOB 2/4 during ambulation. Unsteady without use of AD.  Assist to stabilize and negotiate with RW.  Ambulation Distance (Feet): 120 Feet Assistive device: Rolling walker Gait Pattern: Step-through pattern;Trunk flexed  Posture/Postural Control Posture/Postural Control: No significant limitations Exercise    End of Session PT - End of Session Equipment Utilized During Treatment: Gait belt Activity Tolerance: Patient limited by fatigue Patient left: in chair;with call bell in reach General Behavior During Session: [facility] for tasks performed Cognition: Impaired, at baseline  Rebeca Alert [facility] 01/02/2012, 11:14 AM [phone]

## 2012-01-02 NOTE — Progress Notes (Addendum)
CSW visited with the pt and her sister in law on 01-01-12 and completed psychosocial assessment (pls see shadow chart). Pt reports her son and family are supportive. She lives at home alone and she has a neighbor that comes and cooks for her daily and helps her around the house.  CSW spoke with pts son Chrissie Noa on the phone 01-02-12 and he is agreeable to the pt going to SNF. Pt has been to [facility] in the past. He is also interested in seeing if [facility] has a bed available. CSW awaiting PT eval so FL-2 can be completed. CSW will continue to follow the pt and offer support.   Golden Pop 01/02/2012 8:28 AM [phone]  CSW has informed the pts son that [facility] has made a bed offer, and [facility] does not have a contract with the pts insurance. Pts son requested that he receive a phone call from the pts physician so he can have an update on the pts medical status. CSW has notified Dr. Suanne Marker. CSW will continue to follow the pt and offer support.  Ladene Artist N 01/02/2012 3:07 PM

## 2012-01-02 NOTE — Progress Notes (Signed)
Subjective:   sitting up in a chair, denies any new complaints. Chart reviewed. Objective: Filed Vitals:   01/01/12 2117 01/02/12 0415 01/02/12 0535 01/02/12 1418  BP: 119/84 122/80  112/69  Pulse: 119 113  99  Temp: 99.2 F (37.3 C) 99.2 F (37.3 C) 97.9 F (36.6 C) 97.3 F (36.3 C)  TempSrc: Oral Oral Oral   Resp: 16 17  15   Height:      Weight:  53.9 kg (118 lb 13.3 oz)    SpO2: 96% 96%  95%   Weight change: -2.028 kg (-4 lb 7.6 oz)  Intake/Output Summary (Last 24 hours) at 01/02/12 1647 Last data filed at 01/02/12 1100  Gross per 24 hour  Intake 661.25 ml  Output    900 ml  Net -238.75 ml    General: Alert, awake, oriented x3, in no acute distress.  HEENT: No bruits, no goiter.  Heart: Irregular rate controlled, without murmurs, rubs, gallops.  Lungs: Crackles left side, bilateral air movement.  Abdomen: Soft, nontender, nondistended, positive bowel sounds.  Neuro: Grossly intact, nonfocal.   Lab Results:  Basename 01/01/12 0210 12/31/11 1300  NA 143 142  K 3.6 3.7  CL 108 102  CO2 23 25  GLUCOSE 90 96  BUN 20 19  CREATININE 0.74 0.72  CALCIUM 8.9 9.6  MG -- --  PHOS -- --    Basename 01/01/12 0210 12/31/11 1300  AST 42* 55*  ALT 17 20  ALKPHOS 61 77  BILITOT 0.9 1.2  PROT 5.9* 7.2  ALBUMIN 3.2* 3.9    Basename 12/31/11 1300  LIPASE 36  AMYLASE --    Basename 01/01/12 0210 12/31/11 1300  WBC 7.5 9.8  NEUTROABS -- 7.8*  HGB 13.8 16.0*  HCT 40.1 45.5  MCV 90.3 88.9  PLT 81* 97*    Basename 01/01/12 1015 01/01/12 0210 12/31/11 1900  CKTOTAL 441* 616* 962*  CKMB 6.7* 8.8* 13.9*  CKMBINDEX -- -- --  TROPONINI <0.30 <0.30 <0.30   No components found with this basename: POCBNP:3 No results found for this basename: DDIMER:2 in the last 72 hours  Basename 12/31/11 1900  HGBA1C 5.6   No results found for this basename: CHOL:2,HDL:2,LDLCALC:2,TRIG:2,CHOLHDL:2,LDLDIRECT:2 in the last 72 hours  Basename 12/31/11 1900  TSH 0.860    T4TOTAL --  T3FREE --  THYROIDAB --   No results found for this basename: VITAMINB12:2,FOLATE:2,FERRITIN:2,TIBC:2,IRON:2,RETICCTPCT:2 in the last 72 hours  Micro Results: Recent Results (from the past 240 hour(s))  CULTURE, BLOOD (ROUTINE X 2)     Status: Normal (Preliminary result)   Collection Time   12/31/11  6:50 PM      Component Value Range Status Comment   Specimen Description BLOOD RIGHT HAND   Final    Special Requests BOTTLES DRAWN AEROBIC AND ANAEROBIC 10CC   Final    Culture  Setup Time [id number]   Final    Culture     Final    Value:        BLOOD CULTURE RECEIVED NO GROWTH TO DATE CULTURE WILL BE HELD FOR 5 DAYS BEFORE ISSUING A FINAL NEGATIVE REPORT   Report Status PENDING   Incomplete   CULTURE, BLOOD (ROUTINE X 2)     Status: Normal (Preliminary result)   Collection Time   12/31/11  7:00 PM      Component Value Range Status Comment   Specimen Description BLOOD RIGHT ARM   Final    Special Requests BOTTLES DRAWN AEROBIC AND ANAEROBIC 10CC  Final    Culture  Setup Time [id number]   Final    Culture     Final    Value:        BLOOD CULTURE RECEIVED NO GROWTH TO DATE CULTURE WILL BE HELD FOR 5 DAYS BEFORE ISSUING A FINAL NEGATIVE REPORT   Report Status PENDING   Incomplete       2-D echocardiogram Study Conclusions  - Left ventricle: The cavity size was normal. Systolic function was normal. The estimated ejection fraction was in the range of 50% to 55%. Regional wall motion abnormalities cannot be excluded. The study is not technically sufficient to allow evaluation of LV diastolic function. - Mitral valve: Calcified annulus. Mild regurgitation. - Left atrium: The appendage was moderately dilated. - Right atrium: The appendage was moderately dilated. - Atrial septum: No defect or patent foramen ovale was identified. Impressions:    Medications: I have reviewed the patient's current medications.  Assessment and plan:  1. Syncope and collapse 2-d  echo as above ejection fraction 50-55% no defect or patent foramen ovale identified. Cardiac enzymes negative x3. CT head no bleed. -SNF recommended per PT 2.UTI (lower urinary tract infection) Continue rocephin,No urine cultures have been sent-ordered cultures today follow.   Afebrile.   3.Atrial fibrillation Rate better controlled on increased atenolol. Not a candidate for coumadin, due to recurrent falls, d/w family per Dr. David Stall.  4.HTN (hypertension)  stable.  Rhabdomyolysis: Improved with IV fluids.   6.Disposition: Will need SNF. Social worker working on transition.   LOS: 2 days   Kela Millin M.D. Pager: [phone] [facility] 01/02/2012, 4:47 PM

## 2012-01-03 ENCOUNTER — Ambulatory Visit: Payer: 59

## 2012-01-03 LAB — CBC
HCT: 37.6 % (ref 36.0–46.0)
MCH: 30.5 pg (ref 26.0–34.0)
MCV: 89.7 fL (ref 78.0–100.0)
RDW: 13.3 % (ref 11.5–15.5)
WBC: 4.2 10*3/uL (ref 4.0–10.5)

## 2012-01-03 LAB — URINE CULTURE
Colony Count: NO GROWTH
Culture  Setup Time: 201302070127

## 2012-01-03 MED ORDER — ASPIRIN 81 MG PO CHEW
81.0000 mg | CHEWABLE_TABLET | Freq: Every day | ORAL | Status: DC
  Filled 2012-01-03: qty 1

## 2012-01-03 MED ORDER — CEFUROXIME AXETIL 500 MG PO TABS: 500.0000 mg | ORAL_TABLET | Freq: Two times a day (BID) | ORAL | Status: AC

## 2012-01-03 MED ORDER — HYDROCODONE-ACETAMINOPHEN 5-325 MG PO TABS: 1.0000 | ORAL_TABLET | ORAL | Status: AC | PRN

## 2012-01-03 NOTE — Progress Notes (Signed)
01/03/12 1606 Patient is ready for discharge to [facility]. The telemetry and PIV were discontinued. Patient is a little apprehensive about going to [facility].RN and staff reassured patient.

## 2012-01-03 NOTE — Progress Notes (Signed)
CSW has faxed discharge clinicals to [place] at [facility] and confirmed they were received and pt could be transported to the facility. Pts son has completed the admission paperwork. CSW has compiled documentation to transport with the pt. CSW has notified Dr. Suanne Marker that prescriptions need to be signed . CSW has pre-arranged transportation for 3:45. CSW has notified pts RN Randa Evens, and requested that she call the pts son once PTAR arrives. CSW signing off.  Patrice Paradise, LCSWA 01/03/2012 3:23 PM [phone]

## 2012-01-03 NOTE — Discharge Summary (Signed)
Discharge Note  Name: [name] MRN: [id number] DOB: 1922/10/16 78 y.o.  Date of Admission: 12/31/2011 11:09 AM Date of Discharge: 01/03/2012 Attending Physician: Kela Millin, MD  Discharge Diagnosis: Active Problems:  Syncope and collapse  UTI (lower urinary tract infection)  Atrial fibrillation  HTN (hypertension)  Rhabdomyolysis  Thrombocytopenia -stable, and no gross bleeding   Discharge Medications: Medication List  As of 01/03/2012 12:44 PM   STOP taking these medications         warfarin 1 MG tablet         TAKE these medications         atenolol 25 MG tablet   Commonly known as: TENORMIN   Take 25 mg by mouth daily.      cefUROXime 500 MG tablet   Commonly known as: CEFTIN   Take 1 tablet (500 mg total) by mouth 2 (two) times daily.      digoxin 0.125 MG tablet   Commonly known as: LANOXIN   Take 125 mcg by mouth daily.      ferrous sulfate 325 (65 FE) MG tablet   Take 325 mg by mouth 2 (two) times daily.      furosemide 20 MG tablet   Commonly known as: LASIX   Take 20 mg by mouth daily.      HYDROcodone-acetaminophen 5-325 MG per tablet   Commonly known as: NORCO   Take 1 tablet by mouth every 4 (four) hours as needed.      levothyroxine 100 MCG tablet   Commonly known as: SYNTHROID, LEVOTHROID   Take 100 mcg by mouth daily.      mulitivitamin with minerals Tabs   Take 1 tablet by mouth daily.      OYSTER SHELL CALCIUM 500 + D PO   Take 1 tablet by mouth daily.      Vitamin D (Cholecalciferol) 400 UNITS Chew   Chew 400 Units by mouth 2 (two) times daily.            Disposition and follow-up:   Ms.[name] was discharged from [facility] in improved/stable condition.    Follow-up Appointments: Discharge Orders    Future Orders Please Complete By Expires   Diet - low sodium heart healthy      Increase activity slowly         Consultations:    Procedures Performed:  Dg Chest 2 View  12/31/2011  *RADIOLOGY  REPORT*  Clinical Data: Larey Seat.  CHEST - 2 VIEW  Comparison: None  Findings: The cardiac silhouette, mediastinal and hilar contours are within normal limits.  The lungs are clear.  The bony thorax is intact.  No obvious spinal or rib fractures.  IMPRESSION: No acute cardiopulmonary findings and intact bony thorax.  Original Report Authenticated By: P. Loralie Champagne, M.D.   Dg Hip Complete Left  12/31/2011  *RADIOLOGY REPORT*  Clinical Data: Larey Seat.  Left hip pain.  LEFT HIP - COMPLETE 2+ VIEW  Comparison: None  Findings: The intramedullary rod and dynamic hip screw on the left thyroid and good position without complicating features.  Remote healed intertrochanteric fracture is noted with no complicating features.  The pubic symphysis and SI joints are intact.  No definite pelvic fractures.  The right hip demonstrates degenerative changes but no acute abnormality.  IMPRESSION:  1.  Remote surgical changes involving the left hip without acute fracture. 2.  No definite pelvic fractures.  Original Report Authenticated By: P. Loralie Champagne, M.D.   Ct  Head Wo Contrast  12/31/2011  *RADIOLOGY REPORT*  Clinical Data:  Fall  CT HEAD WITHOUT CONTRAST CT CERVICAL SPINE WITHOUT CONTRAST  Technique:  Multidetector CT imaging of the head and cervical spine was performed following the standard protocol without intravenous contrast.  Multiplanar CT image reconstructions of the cervical spine were also generated.  Comparison:  None  CT HEAD  Findings: Prior right occipital craniotomy for unknown procedure.  Generalized atrophy.  Chronic microvascular ischemia in the white matter.  Negative for acute hemorrhage or infarct.  No mass lesion. Paranasal sinuses are clear.  IMPRESSION: Atrophy and chronic microvascular ischemia.  No acute abnormality.  CT CERVICAL SPINE  Findings: Multilevel advanced disc degeneration and spondylosis. This is present from C2-C7.  Moderate facet degeneration is also present.  2 mm anterior slip C3-4.   Negative for acute fracture.  No mass lesion.  IMPRESSION: Negative for fracture.  Advanced cervical spondylosis.  Original Report Authenticated By: Camelia Phenes, M.D.   Ct Cervical Spine Wo Contrast  12/31/2011  *RADIOLOGY REPORT*  Clinical Data:  Fall  CT HEAD WITHOUT CONTRAST CT CERVICAL SPINE WITHOUT CONTRAST  Technique:  Multidetector CT imaging of the head and cervical spine was performed following the standard protocol without intravenous contrast.  Multiplanar CT image reconstructions of the cervical spine were also generated.  Comparison:  None  CT HEAD  Findings: Prior right occipital craniotomy for unknown procedure.  Generalized atrophy.  Chronic microvascular ischemia in the white matter.  Negative for acute hemorrhage or infarct.  No mass lesion. Paranasal sinuses are clear.  IMPRESSION: Atrophy and chronic microvascular ischemia.  No acute abnormality.  CT CERVICAL SPINE  Findings: Multilevel advanced disc degeneration and spondylosis. This is present from C2-C7.  Moderate facet degeneration is also present.  2 mm anterior slip C3-4.  Negative for acute fracture.  No mass lesion.  IMPRESSION: Negative for fracture.  Advanced cervical spondylosis.  Original Report Authenticated By: Camelia Phenes, M.D.   Dg Shoulder Left  12/31/2011  *RADIOLOGY REPORT*  Clinical Data: Larey Seat.  Left shoulder pain.  LEFT SHOULDER - 2+ VIEW  Comparison: None  Findings: The joint spaces are maintained.  Mild degenerative changes.  No acute fracture.  IMPRESSION: No fracture or dislocation.  Original Report Authenticated By: P. Loralie Champagne, M.D.   Dg Knee Complete 4 Views Right  12/31/2011  *RADIOLOGY REPORT*  Clinical Data: Larey Seat.  Right knee pain.  RIGHT KNEE - COMPLETE 4+ VIEW  Comparison: None  Findings: There are severe tricompartmental degenerative changes and chondrocalcinosis.  No acute fracture or joint effusion.  IMPRESSION:  1.  Severe tricompartmental degenerative changes and chondrocalcinosis. 2.  No  acute fracture or joint effusion.  Original Report Authenticated By: P. Loralie Champagne, M.D.    2D Echo Study Conclusions  - Left ventricle: The cavity size was normal. Systolic function was normal. The estimated ejection fraction was in the range of 50% to 55%. Regional wall motion abnormalities cannot be excluded. The study is not technically sufficient to allow evaluation of LV diastolic function. - Mitral valve: Calcified annulus. Mild regurgitation. - Left atrium: The appendage was moderately dilated. - Right atrium: The appendage was moderately dilated. - Atrial septum: No defect or patent foramen ovale was identified.    Admission HPI This is an 78 year old female with past medical history of A. fib on Coumadin, with a remote history of left hip replacement secondary to a fall post also hypothyroidism and hypertension that comes in for syncope as per  family members last time they saw her wasn't Saturday night. They saw her again this morning, she was found by her caregiver at 8:30. As per family members she seems more confused. She has a stronger smell of urine. She was incontinent of urine and feces when they found her. She also had feces in her mouth. They relate that she was not complaining of any weakness in her extremities but was complaining [place] severe pain in her left hip. She relates her head hurts.  She relates no shortness of breath chest pain nausea or vomiting burning when she urinates. No diarrhea.      Hospital Course by problem list: Active Problems:  Syncope and collapse  UTI (lower urinary tract infection)  Atrial fibrillation  HTN (hypertension)  Rhabdomyolysis  thrombocytopenia     Patient Active Hospital Problem List: 1.Syncope and collapse  Upon admission the patient had a CT scan of her brain which showed no bleed, she was monitored on a telemetry bed during this hospitalization and cardiac enzymes were cycled and came back negative. A 2-D  echocardiogram was obtained and the ejection fraction was 50-55% and it was not noted that regional wall motion abnormalities could not be excluded. No defect or patent foramen ovale was identified and no embolus identified. She does have a history of atrial fibrillation and her rate was controlled on her outpatient medications. It was noted on admission that she appeared clinically dehydrated and she was gently hydrated. PT OT was consulted to evaluate patient as it was noted that she had had several falls at home and assistance the patient and recommended a skilled nursing facility. The patient has not had any further episodes of syncope or dizziness while in the hospital. The impression was that the dehydration/possible orthostasis versus vasovagal as the etiology of her syncope.  2.UTI (lower urinary tract infection)  Patient had a urinalysis on admission which was consistent with a UTI and she was empirically started on Rocephin. Blood cultures obtained on admission showed no growth. A urine specimen for cultures was not sent and still 1/6 and the results of this pending at the time of this dictation, but it is unlikely that they'll be any bacterial growth with this culture since patient had already r been on antibiotics for a few days prior to it been sent. She has remained afebrile and hemodynamically stable with no leukocytosis and will be discharged on oral antibiotics to complete a treatment course. She is to follow up with the SNF physician outpatient.   3.Atrial fibrillation The patient was maintained on digoxin and atenolol for rate control during this hospital stay. She had been on Coumadin prior to admission and the admitting M.D. discussed extensively with the family about the use of Coumadin. He explained the risks and benefits of being on Coumadin to the family especially as she has fallen several times within the last couple years and they   agreed to take her off Coumadin. The patient was  thrombocytopenic on admission and her platelet count is 78 today prior to discharge and so aspirin/antiplatelet agents have been avoided. SNF physician to continue monitoring-follow up on the platelets and consider starting patient on aspirin/appropriate antiplatelet agent when clinically appropriate. She has had no gross bleeding during this hospital stay.   4.HTN (hypertension)  -blood pressure is currently well controlled we'll continue current treatment.    5.Thrombocytopenia -See #3, as discussed above.  6.Rhabdomyolysis -As noted above the patient had fallen prior to admission and was down  for more than 48 hours before she was found by family. Her total CK was elevated on admission and improved with hydration. Her troponins were negative and she remained chest pain-free during this hospital stay. PT OT was consulted and followed patient during this hospital stay, she is being discharged to a skilled nursing facility as above.  Discharge Vitals:  BP 127/81  Pulse 84  Temp(Src) 98.8 F (37.1 C) (Oral)  Resp 16  Ht 5\' 2"  (1.575 m)  Wt 53.207 kg (117 lb 4.8 oz)  BMI 21.45 kg/m2  SpO2 96%  Discharge Labs:  Results for orders placed during the hospital encounter of 12/31/11 (from the past 24 hour(s))  CBC     Status: Abnormal   Collection Time   01/03/12  3:45 AM      Component Value Range   WBC 4.2  4.0 - 10.5 (K/uL)   RBC 4.19  3.87 - 5.11 (MIL/uL)   Hemoglobin 12.8  12.0 - 15.0 (g/dL)   HCT 16.1  09.6 - 04.5 (%)   MCV 89.7  78.0 - 100.0 (fL)   MCH 30.5  26.0 - 34.0 (pg)   MCHC 34.0  30.0 - 36.0 (g/dL)   RDW 40.9  81.1 - 91.4 (%)   Platelets 78 (*) 150 - 400 (K/uL)  CK     Status: Abnormal   Collection Time   01/03/12  3:45 AM      Component Value Range   Total CK 789 (*) 7 - 177 (U/L)    SignedKela Millin 01/03/2012, 12:44 PM

## 2012-01-03 NOTE — Progress Notes (Signed)
CSW visited with the pt and her son and discussed discharge today. CSW has confirmed with Jill [facility] at [facility] that a bed is available today. CSW informed the pts son that he will need to complete paperwork at the facility. CSW will begin discharge process.  [name] 01/03/2012 12:39 PM [phone]

## 2012-01-07 LAB — CULTURE, BLOOD (ROUTINE X 2): Culture: NO GROWTH

## 2012-01-10 ENCOUNTER — Encounter: Payer: Self-pay | Admitting: Cardiology

## 2012-02-05 ENCOUNTER — Ambulatory Visit: Payer: Self-pay | Admitting: Family Medicine

## 2012-02-05 ENCOUNTER — Encounter: Payer: Self-pay | Admitting: Family Medicine

## 2012-02-05 ENCOUNTER — Ambulatory Visit (INDEPENDENT_AMBULATORY_CARE_PROVIDER_SITE_OTHER): Payer: PRIVATE HEALTH INSURANCE | Admitting: Family Medicine

## 2012-02-05 VITALS — BP 102/58 | HR 83 | Temp 97.3°F | Wt 112.0 lb

## 2012-02-05 DIAGNOSIS — F0390 Unspecified dementia without behavioral disturbance: Secondary | ICD-10-CM

## 2012-02-05 DIAGNOSIS — I4891 Unspecified atrial fibrillation: Secondary | ICD-10-CM

## 2012-02-05 DIAGNOSIS — Z7901 Long term (current) use of anticoagulants: Secondary | ICD-10-CM

## 2012-02-05 NOTE — Progress Notes (Signed)
Was down after a fall.  Was soiled.  She has no memory of event.  Was inpatient, then to SNF. Now back at home.   Has a caregiver checking on her 3 times a day.  Still alone at night.   She constantly asked about coming home from SNF.    Here with family today.  She is off coumadin.    Prev hospital records reviewed.   Meds, vitals, and allergies reviewed.   ROS: See HPI.  Otherwise, noncontributory.  nad ncat Mmm IRR, not tachy ctab abd soft Ext w/o edema Doesn't know the year.  Thinks it's 2005.  Corrected, 2013.  Asked a few moments later and she can't remember that it's 2013.  She doesn't know what happened at home and doesn't know why she can't live alone.

## 2012-02-05 NOTE — Patient Instructions (Signed)
I wouldn't change any meds at this point.  I would high recommend not leaving her alone due to her fall risk and memory trouble.  Call me if I can be of service.

## 2012-02-07 NOTE — Assessment & Plan Note (Signed)
Not appropriate for independent living.  She can either go back to SNF, which she didn't like, or get 24h care at home.  Pt doesn't understand the depth of memory loss and safety issues.  Pt isn't able to make her own decisions about this.  This is up to family and I'll try to help them as much as possible.  Family will discuss.  I was honest with them- if she's alone, she is at high risk for another poor outcome.  Off coumadin, continue other meds. >25 min spent with face to face with patient, >50% counseling and/or coordinating care

## 2012-04-22 ENCOUNTER — Other Ambulatory Visit: Payer: Self-pay | Admitting: *Deleted

## 2012-04-22 MED ORDER — ATENOLOL 25 MG PO TABS: 25.0000 mg | ORAL_TABLET | Freq: Two times a day (BID) | ORAL | Status: DC

## 2012-05-17 ENCOUNTER — Inpatient Hospital Stay (HOSPITAL_COMMUNITY)
Admission: EM | Admit: 2012-05-17 | Discharge: 2012-05-21 | DRG: 481 | Disposition: A | Discharge status: Skilled Nursing Facility | Payer: Medicare Other | Attending: Internal Medicine | Admitting: Internal Medicine

## 2012-05-17 ENCOUNTER — Inpatient Hospital Stay (HOSPITAL_COMMUNITY): Payer: Medicare Other | Admitting: Registered Nurse

## 2012-05-17 ENCOUNTER — Encounter (HOSPITAL_COMMUNITY): Payer: Self-pay | Admitting: Internal Medicine

## 2012-05-17 ENCOUNTER — Encounter (HOSPITAL_COMMUNITY): Payer: Self-pay | Admitting: Registered Nurse

## 2012-05-17 ENCOUNTER — Inpatient Hospital Stay (HOSPITAL_COMMUNITY): Payer: Medicare Other

## 2012-05-17 ENCOUNTER — Encounter (HOSPITAL_COMMUNITY)
Admission: EM | Disposition: A | Discharge status: Skilled Nursing Facility | Payer: Self-pay | Source: Home / Self Care | Attending: Internal Medicine

## 2012-05-17 ENCOUNTER — Emergency Department (HOSPITAL_COMMUNITY): Payer: Medicare Other

## 2012-05-17 DIAGNOSIS — Z9089 Acquired absence of other organs: Secondary | ICD-10-CM

## 2012-05-17 DIAGNOSIS — I252 Old myocardial infarction: Secondary | ICD-10-CM

## 2012-05-17 DIAGNOSIS — I10 Essential (primary) hypertension: Secondary | ICD-10-CM | POA: Diagnosis present

## 2012-05-17 DIAGNOSIS — D649 Anemia, unspecified: Secondary | ICD-10-CM

## 2012-05-17 DIAGNOSIS — S72002A Fracture of unspecified part of neck of left femur, initial encounter for closed fracture: Secondary | ICD-10-CM

## 2012-05-17 DIAGNOSIS — M129 Arthropathy, unspecified: Secondary | ICD-10-CM | POA: Diagnosis present

## 2012-05-17 DIAGNOSIS — M4800 Spinal stenosis, site unspecified: Secondary | ICD-10-CM

## 2012-05-17 DIAGNOSIS — R000 Tachycardia, unspecified: Secondary | ICD-10-CM

## 2012-05-17 DIAGNOSIS — F0390 Unspecified dementia without behavioral disturbance: Secondary | ICD-10-CM | POA: Diagnosis present

## 2012-05-17 DIAGNOSIS — Z888 Allergy status to other drugs, medicaments and biological substances status: Secondary | ICD-10-CM

## 2012-05-17 DIAGNOSIS — I4891 Unspecified atrial fibrillation: Secondary | ICD-10-CM | POA: Diagnosis present

## 2012-05-17 DIAGNOSIS — Z8701 Personal history of pneumonia (recurrent): Secondary | ICD-10-CM

## 2012-05-17 DIAGNOSIS — W19XXXA Unspecified fall, initial encounter: Secondary | ICD-10-CM | POA: Diagnosis present

## 2012-05-17 DIAGNOSIS — Y92009 Unspecified place in unspecified non-institutional (private) residence as the place of occurrence of the external cause: Secondary | ICD-10-CM

## 2012-05-17 DIAGNOSIS — E785 Hyperlipidemia, unspecified: Secondary | ICD-10-CM | POA: Diagnosis present

## 2012-05-17 DIAGNOSIS — M810 Age-related osteoporosis without current pathological fracture: Secondary | ICD-10-CM | POA: Diagnosis present

## 2012-05-17 DIAGNOSIS — S72009A Fracture of unspecified part of neck of unspecified femur, initial encounter for closed fracture: Secondary | ICD-10-CM

## 2012-05-17 DIAGNOSIS — D509 Iron deficiency anemia, unspecified: Secondary | ICD-10-CM | POA: Diagnosis present

## 2012-05-17 DIAGNOSIS — R251 Tremor, unspecified: Secondary | ICD-10-CM

## 2012-05-17 DIAGNOSIS — R55 Syncope and collapse: Secondary | ICD-10-CM

## 2012-05-17 DIAGNOSIS — M6282 Rhabdomyolysis: Secondary | ICD-10-CM

## 2012-05-17 DIAGNOSIS — S72143A Displaced intertrochanteric fracture of unspecified femur, initial encounter for closed fracture: Principal | ICD-10-CM | POA: Diagnosis present

## 2012-05-17 DIAGNOSIS — N390 Urinary tract infection, site not specified: Secondary | ICD-10-CM

## 2012-05-17 DIAGNOSIS — I959 Hypotension, unspecified: Secondary | ICD-10-CM | POA: Diagnosis present

## 2012-05-17 DIAGNOSIS — Z7901 Long term (current) use of anticoagulants: Secondary | ICD-10-CM

## 2012-05-17 DIAGNOSIS — D696 Thrombocytopenia, unspecified: Secondary | ICD-10-CM | POA: Diagnosis present

## 2012-05-17 DIAGNOSIS — D62 Acute posthemorrhagic anemia: Secondary | ICD-10-CM | POA: Diagnosis present

## 2012-05-17 DIAGNOSIS — Z7902 Long term (current) use of antithrombotics/antiplatelets: Secondary | ICD-10-CM

## 2012-05-17 DIAGNOSIS — E039 Hypothyroidism, unspecified: Secondary | ICD-10-CM | POA: Diagnosis present

## 2012-05-17 LAB — COMPREHENSIVE METABOLIC PANEL
AST: 22 U/L (ref 0–37)
Albumin: 3.9 g/dL (ref 3.5–5.2)
Calcium: 9.8 mg/dL (ref 8.4–10.5)
Creatinine, Ser: 0.75 mg/dL (ref 0.50–1.10)
GFR calc non Af Amer: 74 mL/min — ABNORMAL LOW (ref >90)
Total Protein: 6.8 g/dL (ref 6.0–8.3)

## 2012-05-17 LAB — DIFFERENTIAL
Basophils Absolute: 0 10*3/uL (ref 0.0–0.1)
Eosinophils Absolute: 0 10*3/uL (ref 0.0–0.7)
Lymphs Abs: 0.7 10*3/uL (ref 0.7–4.0)
Monocytes Absolute: 0.5 10*3/uL (ref 0.1–1.0)

## 2012-05-17 LAB — ABO/RH: ABO/RH(D): O POS

## 2012-05-17 LAB — URINALYSIS, ROUTINE W REFLEX MICROSCOPIC
Glucose, UA: NEGATIVE mg/dL
Ketones, ur: NEGATIVE mg/dL
Leukocytes, UA: NEGATIVE
pH: 7.5 (ref 5.0–8.0)

## 2012-05-17 LAB — PROTIME-INR: INR: 1.18 (ref 0.00–1.49)

## 2012-05-17 LAB — CBC
MCH: 30.7 pg (ref 26.0–34.0)
MCV: 89.4 fL (ref 78.0–100.0)
Platelets: 82 10*3/uL — ABNORMAL LOW (ref 150–400)
RDW: 13.1 % (ref 11.5–15.5)

## 2012-05-17 LAB — DIGOXIN LEVEL: Digoxin Level: 0.7 ng/mL — ABNORMAL LOW (ref 0.8–2.0)

## 2012-05-17 SURGERY — INSERTION, INTRAMEDULLARY ROD, FEMUR
Anesthesia: Spinal | Site: Hip | Laterality: Right | Wound class: Clean

## 2012-05-17 MED ORDER — ALUM & MAG HYDROXIDE-SIMETH 200-200-20 MG/5ML PO SUSP: 30.0000 mL | ORAL | Status: DC | PRN

## 2012-05-17 MED ORDER — BISACODYL 10 MG RE SUPP: 10.0000 mg | Freq: Every day | RECTAL | Status: DC | PRN

## 2012-05-17 MED ORDER — CEFAZOLIN SODIUM 1-5 GM-% IV SOLN
INTRAVENOUS | Status: DC | PRN
  Administered 2012-05-17: 1 g via INTRAVENOUS

## 2012-05-17 MED ORDER — DIGOXIN NICU IV SYRINGE 100 MCG/ML: 250.0000 ug | Freq: Once | INTRAMUSCULAR | Status: DC

## 2012-05-17 MED ORDER — MENTHOL 3 MG MT LOZG
1.0000 | LOZENGE | OROMUCOSAL | Status: DC | PRN
  Filled 2012-05-17: qty 9

## 2012-05-17 MED ORDER — POLYETHYLENE GLYCOL 3350 17 G PO PACK: 17.0000 g | PACK | Freq: Every day | ORAL | Status: DC | PRN

## 2012-05-17 MED ORDER — MORPHINE SULFATE 2 MG/ML IJ SOLN: 0.5000 mg | INTRAMUSCULAR | Status: DC | PRN

## 2012-05-17 MED ORDER — LACTATED RINGERS IV SOLN
INTRAVENOUS | Status: DC | PRN
  Administered 2012-05-17 (×2): via INTRAVENOUS

## 2012-05-17 MED ORDER — LACTATED RINGERS IV SOLN: INTRAVENOUS | Status: DC

## 2012-05-17 MED ORDER — ACETAMINOPHEN 10 MG/ML IV SOLN
1000.0000 mg | Freq: Four times a day (QID) | INTRAVENOUS | Status: AC
  Administered 2012-05-17 – 2012-05-18 (×4): 1000 mg via INTRAVENOUS
  Filled 2012-05-17 (×5): qty 100

## 2012-05-17 MED ORDER — SODIUM CHLORIDE 0.9 % IV SOLN
INTRAVENOUS | Status: DC
  Administered 2012-05-17 – 2012-05-19 (×4): via INTRAVENOUS

## 2012-05-17 MED ORDER — LIDOCAINE HCL (CARDIAC) 20 MG/ML IV SOLN
INTRAVENOUS | Status: DC | PRN
  Administered 2012-05-17: 50 mg via INTRAVENOUS

## 2012-05-17 MED ORDER — METOCLOPRAMIDE HCL 5 MG/ML IJ SOLN: 5.0000 mg | Freq: Three times a day (TID) | INTRAMUSCULAR | Status: DC | PRN

## 2012-05-17 MED ORDER — CEFAZOLIN SODIUM 1-5 GM-% IV SOLN
INTRAVENOUS | Status: AC
  Filled 2012-05-17: qty 50

## 2012-05-17 MED ORDER — SODIUM CHLORIDE 0.9 % IJ SOLN: 3.0000 mL | Freq: Two times a day (BID) | INTRAMUSCULAR | Status: DC

## 2012-05-17 MED ORDER — POLYETHYLENE GLYCOL 3350 17 G PO PACK
17.0000 g | PACK | Freq: Every day | ORAL | Status: DC | PRN
  Filled 2012-05-17: qty 1

## 2012-05-17 MED ORDER — CEFAZOLIN SODIUM 1-5 GM-% IV SOLN
1.0000 g | Freq: Three times a day (TID) | INTRAVENOUS | Status: AC
  Administered 2012-05-18 (×2): 1 g via INTRAVENOUS
  Filled 2012-05-17 (×2): qty 50

## 2012-05-17 MED ORDER — CALCIUM CARBONATE-VITAMIN D 500-200 MG-UNIT PO TABS
1.0000 | ORAL_TABLET | Freq: Every day | ORAL | Status: DC
  Administered 2012-05-18 – 2012-05-21 (×4): 1 via ORAL
  Filled 2012-05-17 (×4): qty 1

## 2012-05-17 MED ORDER — ADULT MULTIVITAMIN W/MINERALS CH
1.0000 | ORAL_TABLET | Freq: Every day | ORAL | Status: DC
  Administered 2012-05-18 – 2012-05-21 (×4): 1 via ORAL
  Filled 2012-05-17 (×4): qty 1

## 2012-05-17 MED ORDER — MEPERIDINE HCL 50 MG/ML IJ SOLN
INTRAMUSCULAR | Status: AC
  Administered 2012-05-17: 21:00:00
  Filled 2012-05-17: qty 1

## 2012-05-17 MED ORDER — BUPIVACAINE HCL (PF) 0.5 % IJ SOLN
INTRAMUSCULAR | Status: DC | PRN
  Administered 2012-05-17: 3 mL

## 2012-05-17 MED ORDER — PHENOL 1.4 % MT LIQD
1.0000 | OROMUCOSAL | Status: DC | PRN
  Filled 2012-05-17: qty 177

## 2012-05-17 MED ORDER — OXYCODONE HCL 5 MG PO TABS: 5.0000 mg | ORAL_TABLET | ORAL | Status: DC | PRN

## 2012-05-17 MED ORDER — DIGOXIN 125 MCG PO TABS
125.0000 ug | ORAL_TABLET | Freq: Every day | ORAL | Status: DC
  Administered 2012-05-18 – 2012-05-21 (×4): 125 ug via ORAL
  Filled 2012-05-17 (×4): qty 1

## 2012-05-17 MED ORDER — ONDANSETRON HCL 4 MG/2ML IJ SOLN: 4.0000 mg | Freq: Four times a day (QID) | INTRAMUSCULAR | Status: DC | PRN

## 2012-05-17 MED ORDER — PROMETHAZINE HCL 25 MG/ML IJ SOLN: 6.2500 mg | INTRAMUSCULAR | Status: DC | PRN

## 2012-05-17 MED ORDER — ACETAMINOPHEN 650 MG RE SUPP: 650.0000 mg | Freq: Four times a day (QID) | RECTAL | Status: DC | PRN

## 2012-05-17 MED ORDER — MIDAZOLAM HCL 5 MG/5ML IJ SOLN
INTRAMUSCULAR | Status: DC | PRN
  Administered 2012-05-17: 1 mg via INTRAVENOUS

## 2012-05-17 MED ORDER — KETAMINE HCL 10 MG/ML IJ SOLN
INTRAMUSCULAR | Status: DC | PRN
  Administered 2012-05-17: 30 mg via INTRAVENOUS

## 2012-05-17 MED ORDER — METOCLOPRAMIDE HCL 10 MG PO TABS: 5.0000 mg | ORAL_TABLET | Freq: Three times a day (TID) | ORAL | Status: DC | PRN

## 2012-05-17 MED ORDER — PHENYLEPHRINE HCL 10 MG/ML IJ SOLN
INTRAMUSCULAR | Status: DC | PRN
  Administered 2012-05-17 (×5): 40 ug via INTRAVENOUS

## 2012-05-17 MED ORDER — FENTANYL CITRATE 0.05 MG/ML IJ SOLN
50.0000 ug | Freq: Once | INTRAMUSCULAR | Status: AC
  Administered 2012-05-17: 50 ug via INTRAVENOUS
  Filled 2012-05-17: qty 2

## 2012-05-17 MED ORDER — ACETAMINOPHEN 10 MG/ML IV SOLN
INTRAVENOUS | Status: DC | PRN
  Administered 2012-05-17: 1000 mg via INTRAVENOUS

## 2012-05-17 MED ORDER — ONDANSETRON HCL 4 MG PO TABS: 4.0000 mg | ORAL_TABLET | Freq: Four times a day (QID) | ORAL | Status: DC | PRN

## 2012-05-17 MED ORDER — PHENYLEPHRINE HCL 10 MG/ML IJ SOLN
20.0000 mg | INTRAVENOUS | Status: DC | PRN
  Administered 2012-05-17: 10 ug/min via INTRAVENOUS

## 2012-05-17 MED ORDER — ONDANSETRON HCL 4 MG/2ML IJ SOLN
4.0000 mg | Freq: Once | INTRAMUSCULAR | Status: AC
  Administered 2012-05-17: 4 mg via INTRAVENOUS
  Filled 2012-05-17: qty 2

## 2012-05-17 MED ORDER — VITAMIN D (ERGOCALCIFEROL) 1.25 MG (50000 UNIT) PO CAPS
50000.0000 [IU] | ORAL_CAPSULE | ORAL | Status: DC
  Administered 2012-05-18: 50000 [IU] via ORAL
  Filled 2012-05-17: qty 1

## 2012-05-17 MED ORDER — DIGOXIN 0.25 MG/ML IJ SOLN
0.2500 mg | INTRAMUSCULAR | Status: DC
Start: 2012-05-17 — End: 2012-05-17
  Filled 2012-05-17: qty 1

## 2012-05-17 MED ORDER — FERROUS SULFATE 325 (65 FE) MG PO TABS
325.0000 mg | ORAL_TABLET | Freq: Two times a day (BID) | ORAL | Status: DC
  Administered 2012-05-18 – 2012-05-21 (×7): 325 mg via ORAL
  Filled 2012-05-17 (×9): qty 1

## 2012-05-17 MED ORDER — PROPOFOL 10 MG/ML IV EMUL
INTRAVENOUS | Status: DC | PRN
  Administered 2012-05-17: 50 ug/kg/min via INTRAVENOUS

## 2012-05-17 MED ORDER — ACETAMINOPHEN 325 MG PO TABS
650.0000 mg | ORAL_TABLET | Freq: Four times a day (QID) | ORAL | Status: DC | PRN
  Administered 2012-05-21: 650 mg via ORAL
  Filled 2012-05-17: qty 2

## 2012-05-17 MED ORDER — BUPIVACAINE HCL (PF) 0.5 % IJ SOLN
INTRAMUSCULAR | Status: AC
  Filled 2012-05-17: qty 30

## 2012-05-17 MED ORDER — HYDROMORPHONE HCL PF 1 MG/ML IJ SOLN: 0.2500 mg | INTRAMUSCULAR | Status: DC | PRN

## 2012-05-17 MED ORDER — LEVOTHYROXINE SODIUM 50 MCG PO TABS
50.0000 ug | ORAL_TABLET | Freq: Every day | ORAL | Status: DC
  Administered 2012-05-18 – 2012-05-21 (×4): 50 ug via ORAL
  Filled 2012-05-17 (×5): qty 1

## 2012-05-17 MED ORDER — DOCUSATE SODIUM 100 MG PO CAPS
100.0000 mg | ORAL_CAPSULE | Freq: Two times a day (BID) | ORAL | Status: DC
  Administered 2012-05-17 – 2012-05-20 (×7): 100 mg via ORAL
  Filled 2012-05-17 (×9): qty 1

## 2012-05-17 MED ORDER — OXYCODONE HCL 5 MG PO TABS
5.0000 mg | ORAL_TABLET | ORAL | Status: DC | PRN
  Administered 2012-05-18: 5 mg via ORAL
  Filled 2012-05-17: qty 1

## 2012-05-17 MED ORDER — MEPERIDINE HCL 50 MG/ML IJ SOLN
6.2500 mg | INTRAMUSCULAR | Status: DC | PRN
  Administered 2012-05-17: 6.25 mg via INTRAVENOUS

## 2012-05-17 MED ORDER — ACETAMINOPHEN 10 MG/ML IV SOLN
INTRAVENOUS | Status: AC
  Filled 2012-05-17: qty 100

## 2012-05-17 MED ORDER — ATENOLOL 25 MG PO TABS
25.0000 mg | ORAL_TABLET | Freq: Two times a day (BID) | ORAL | Status: DC
  Filled 2012-05-17 (×3): qty 1

## 2012-05-17 NOTE — Consult Note (Signed)
I have seen and examined the patient. I agree with the findings above. I discussed with the patient's son the risks and benefits of surgery, including the possibility of infection, nerve injury, vessel injury, wound breakdown, arthritis, symptomatic hardware, DVT/ PE, loss of motion, heart attack, stroke, death, and need for further surgery among others.  He understood these risks given identical procedure on contralateral leg previously and wished to proceed.  Budd Palmer, MD 05/17/2012 8:17 PM

## 2012-05-17 NOTE — Transfer of Care (Signed)
Immediate Anesthesia Transfer of Care Note  Patient: [name]  Procedure(s) Performed: Procedure(s) (LRB): INTRAMEDULLARY (IM) NAIL FEMORAL (Right)  Patient Location: PACU  Anesthesia Type: MAC combined with regional for post-op pain  Level of Consciousness: awake and confused  Airway & Oxygen Therapy: Patient Spontanous Breathing and Patient connected to face mask oxygen  Post-op Assessment: Report given to PACU RN and Post -op Vital signs reviewed and stable  Post vital signs: Reviewed and stable  Complications: No apparent anesthesia complications

## 2012-05-17 NOTE — H&P (Signed)
PCP:   [name], MD   Chief Complaint:  Right Hip pain  HPI: 78 y/o female with hx of dementia, HTN, HLD,hypothyroidism and osteoporosis; came to the ED after she experienced mechanical at home yard with excruciating right hip pain; pain is worse with movement and hip rotation. She denies any prodromic symptoms that precipitated her fall. specifically denies CP, SOB, lightheadedness, abdominal pain, nausea/vomiting, LOC or syncope. In the ED she was found to have right intertrochanteric fx with varus angulation; TRH call to admit and ortho consulted for surgical repair.  Allergies:   Allergies  Allergen Reactions  . Alendronate Sodium   . Aricept (Donepezil Hydrochloride)   . Warfarin Sodium     Held after fall 12/2011      Past Medical History  Diagnosis Date  . Anxiety   . HTN (hypertension)   . Atrial fibrillation   . Dementia   . Tremor   . History of pneumonia   . Hypothyroid   . Osteoporosis     prev intolerant of alendronate  . Myocardial infarction   . Hypothyroidism   . Anemia   . Hypertension   . Closed left hip fracture   . Arthritis   . DEMENTIA     Past Surgical History  Procedure Date  . Head surgery     for trigeminal neuralgia  . Total hip arthroplasty   . Cataract extraction   . Appendectomy   . Cataract extraction, bilateral   . Right rotator cuff repair   . Screw to left hip fracture     Prior to Admission medications   Medication Sig Start Date End Date Taking? Authorizing Provider  atenolol (TENORMIN) 25 MG tablet Take 25 mg by mouth 2 (two) times daily. 04/22/12  Yes Rollene Rotunda, MD  Calcium Carbonate-Vitamin D (OYSTER SHELL CALCIUM 500 + D PO) Take 1 tablet by mouth daily.   Yes Historical Provider, MD  digoxin (LANOXIN) 0.125 MG tablet Take 125 mcg by mouth daily.   Yes Historical Provider, MD  ferrous sulfate 325 (65 FE) MG tablet Take 325 mg by mouth 2 (two) times daily.     Yes Historical Provider, MD  furosemide (LASIX) 20  MG tablet ONE TABLET 5 X WEEKLY OR AS DIRECTED. 12/12/11  Yes Rollene Rotunda, MD  levothyroxine (SYNTHROID, LEVOTHROID) 50 MCG tablet Take 1 tablet by mouth daily. 6 days a week, not on Sundays 05/22/11  Yes Historical Provider, MD  Multiple Vitamin (MULTIVITAMIN) tablet Take 1 tablet by mouth daily.    Yes Historical Provider, MD    Social History:  reports that she has never smoked. She has never used smokeless tobacco. She reports that she does not drink alcohol or use illicit drugs.  Family History  Problem Relation Age of Onset  . Cancer    . Cancer Sister     breast cancer  . Cancer Brother     lung cancer    Review of Systems:  Black stools per family member reports; otherwise negative except as mentioned on HPI.  Physical Exam: Blood pressure 133/80, pulse 84, temperature 97.7 F (36.5 C), temperature source Oral, resp. rate 16, SpO2 95.00%. GEN: AAOX3; no acute distress; complaining of right hip pain. HENT:  Head: Normocephalic and atraumatic.  Eyes: EOM are normal. Pupils are equal, round, and reactive to light.  Neck: Normal range of motion. Neck supple.  Cardiovascular: mild tachycardia; irregular rhythm; no rubs or gallops.  Pulmonary/Chest: Effort normal and breath sounds normal. No respiratory  distress.  Abdominal: Soft. She exhibits no distension. There is no tenderness. Positive BS  Musculoskeletal: Tenderness to right hip. Pain with movement. Neurovascular intact distally. No edema. Neuro: CN intact; no focal deficit appreciated; limited exam due to right hip pain for MS evaluation, but appears to be 4/5 simetrically.   Labs on Admission:  Results for orders placed during the hospital encounter of 05/17/12 (from the past 48 hour(s))  ABO/RH     Status: Normal   Collection Time   05/17/12  1:24 PM      Component Value Range Comment   ABO/RH(D) O POS     CBC     Status: Abnormal   Collection Time   05/17/12  1:54 PM      Component Value Range Comment   WBC  7.4  4.0 - 10.5 K/uL    RBC 4.89  3.87 - 5.11 MIL/uL    Hemoglobin 15.0  12.0 - 15.0 g/dL    HCT 16.1  09.6 - 04.5 %    MCV 89.4  78.0 - 100.0 fL    MCH 30.7  26.0 - 34.0 pg    MCHC 34.3  30.0 - 36.0 g/dL    RDW 40.9  81.1 - 91.4 %    Platelets 82 (*) 150 - 400 K/uL   DIFFERENTIAL     Status: Abnormal   Collection Time   05/17/12  1:54 PM      Component Value Range Comment   Neutrophils Relative 83 (*) 43 - 77 %    Lymphocytes Relative 10 (*) 12 - 46 %    Monocytes Relative 7  3 - 12 %    Eosinophils Relative 0  0 - 5 %    Basophils Relative 0  0 - 1 %    Neutro Abs 6.2  1.7 - 7.7 K/uL    Lymphs Abs 0.7  0.7 - 4.0 K/uL    Monocytes Absolute 0.5  0.1 - 1.0 K/uL    Eosinophils Absolute 0.0  0.0 - 0.7 K/uL    Basophils Absolute 0.0  0.0 - 0.1 K/uL    Smear Review MORPHOLOGY UNREMARKABLE     COMPREHENSIVE METABOLIC PANEL     Status: Abnormal   Collection Time   05/17/12  1:54 PM      Component Value Range Comment   Sodium 141  135 - 145 mEq/L    Potassium 3.8  3.5 - 5.1 mEq/L    Chloride 103  96 - 112 mEq/L    CO2 30  19 - 32 mEq/L    Glucose, Bld 129 (*) 70 - 99 mg/dL    BUN 13  6 - 23 mg/dL    Creatinine, Ser 7.82  0.50 - 1.10 mg/dL    Calcium 9.8  8.4 - 95.6 mg/dL    Total Protein 6.8  6.0 - 8.3 g/dL    Albumin 3.9  3.5 - 5.2 g/dL    AST 22  0 - 37 U/L    ALT 13  0 - 35 U/L    Alkaline Phosphatase 73  39 - 117 U/L    Total Bilirubin 0.6  0.3 - 1.2 mg/dL    GFR calc non Af Amer 74 (*) >90 mL/min    GFR calc Af Amer 86 (*) >90 mL/min   TYPE AND SCREEN     Status: Normal   Collection Time   05/17/12  1:54 PM      Component Value Range Comment   ABO/RH(D)  O POS      Antibody Screen NEG      Sample Expiration 05/20/2012     DIGOXIN LEVEL     Status: Abnormal   Collection Time   05/17/12  1:54 PM      Component Value Range Comment   Digoxin Level 0.7 (*) 0.8 - 2.0 ng/mL   PROTIME-INR     Status: Abnormal   Collection Time   05/17/12  1:54 PM      Component Value Range  Comment   Prothrombin Time 15.3 (*) 11.6 - 15.2 seconds    INR 1.18  0.00 - 1.49   URINALYSIS, ROUTINE W REFLEX MICROSCOPIC     Status: Abnormal   Collection Time   05/17/12  2:39 PM      Component Value Range Comment   Color, Urine YELLOW  YELLOW    APPearance CLOUDY (*) CLEAR    Specific Gravity, Urine 1.016  1.005 - 1.030    pH 7.5  5.0 - 8.0    Glucose, UA NEGATIVE  NEGATIVE mg/dL    Hgb urine dipstick NEGATIVE  NEGATIVE    Bilirubin Urine NEGATIVE  NEGATIVE    Ketones, ur NEGATIVE  NEGATIVE mg/dL    Protein, ur NEGATIVE  NEGATIVE mg/dL    Urobilinogen, UA 0.2  0.0 - 1.0 mg/dL    Nitrite NEGATIVE  NEGATIVE    Leukocytes, UA NEGATIVE  NEGATIVE MICROSCOPIC NOT DONE ON URINES WITH NEGATIVE PROTEIN, BLOOD, LEUKOCYTES, NITRITE, OR GLUCOSE <1000 mg/dL.    Radiological Exams on Admission: Dg Hip Complete Right  05/17/2012  *RADIOLOGY REPORT*  Clinical Data: Fall with right hip pain and deformity.  RIGHT HIP - COMPLETE 2+ VIEW  Comparison: None  Findings: A mildly comminuted intertrochanteric fracture is identified with varus angulation.  The lesser trochanter fragment is displaced by 5 mm. There is no evidence of subluxation or dislocation. Moderate to severe degenerative changes in both hips are present. An intramedullary rod and screw within the proximal left femur is identified.  IMPRESSION: Intertrochanteric right femur fracture with varus angulation.  Original Report Authenticated By: Rosendo Gros, M.D.     Assessment/Plan 1-Hip fracture, intertrochanteric: patient experienced mechanical fall at her yard; no LOC, palpitation, CP or SOB precipitating the fall. Ortho has been consulted for surgery; will admit to telemetry, check CXR, TSH, NPO and provide pain meds. Strict bed rest until clear by ortho for PT.  2-HTN (hypertension): stable will continue current medication regimen.  3-Dementia: severe, but stable. Main problem is short term memory. Patient AAOX#; will provide supportive  care.  4-Atrial fibrillation: stable; continue b-blocker and digoxin; nit a candidate for anticoagulation therapy  5-Osteoporosis: continue vit and calcium.  6-Hypothyroidism: will check TSH and will continue synthroid.  7-Hx of Anemia: continue ferrous sulfate; will follow hgb trend.  8-Black stool: most likely due to iron therapy; will guaiac stool x3 and follow Hgb.    SCD's for DVT prophylaxis.    Time Spent on Admission: 50 minutes  Demyah Smyre [facility] [phone]  05/17/2012, 4:05 PM

## 2012-05-17 NOTE — Consult Note (Signed)
Orthopaedic Trauma [facility]  Reason for Consult:R hip fracture s/p fall Referring Physician: Lestine Box, MD, internal medicine    HPI: [name] is a very pleasant 78 y/o female known to the OTS who sustained a L hip fx in 2007 and was treated operatively, who sustained an unwitnessed but presumed mechanical fall earlier today.  Pt lives alone but has a regular caretaker.  Pt wandered outside to find her son who was mowing the grass when she fell. Pt was picked up by her son and EMS was contacted.  Pt unable to bear weight on R leg after fall.  Pt brought to [facility] ED where she was found to have a R intertrochanteric femur fx.  Due to medical comorbidities medicine was contacted for admission and have cleared the pt for surgery.   Currently pt is in rm 4 and is accompanied by her son.  She appears comfortable.  Denies pain elsewhere other than the R hip.  Denies numbness or tingling in legs.  Denies CP, no sob.  Does not recall blacking out/passing out prior to or after fall.    Past Medical History  Diagnosis Date  . Anxiety   . HTN (hypertension)   . Atrial fibrillation   . Dementia   . Tremor   . History of pneumonia   . Hypothyroid   . Osteoporosis     prev intolerant of alendronate  . Myocardial infarction   . Hypothyroidism   . Anemia   . Hypertension   . Closed left hip fracture   . Arthritis   . DEMENTIA     Past Surgical History  Procedure Date  . Head surgery     for trigeminal neuralgia  . Total hip arthroplasty   . Cataract extraction   . Appendectomy   . Cataract extraction, bilateral   . Right rotator cuff repair   . Screw to left hip fracture     Family History  Problem Relation Age of Onset  . Cancer    . Cancer Sister     breast cancer  . Cancer Brother     lung cancer    Social History:  reports that she has never smoked. She has never used smokeless tobacco. She reports that she does not drink alcohol or use illicit drugs. Pt lives on her own but  there is a caretaker that checks on her daily Son reports that pt uses walker daily but is even unsteady with walker at times   Allergies:  Allergies  Allergen Reactions  . Alendronate Sodium   . Aricept (Donepezil Hydrochloride)   . Warfarin Sodium     Held after fall 12/2011    Medications: I have reviewed the patient's current medications.  Current outpatient prescriptions:  atenolol (TENORMIN) 25 MG tablet, Take 25 mg by mouth 2 (two) times daily., Disp: , Rfl: ;    Calcium Carbonate-Vitamin D (OYSTER SHELL CALCIUM 500 + D PO), Take 1 tablet by mouth daily., Disp: , Rfl: ;    digoxin (LANOXIN) 0.125 MG tablet, Take 125 mcg by mouth daily., Disp: , Rfl: ;    ferrous sulfate 325 (65 FE) MG tablet, Take 325 mg by mouth 2 (two) times daily.  , Disp: , Rfl:   furosemide (LASIX) 20 MG tablet, ONE TABLET 5 X WEEKLY OR AS DIRECTED., Disp: 30 tablet, Rfl: 6;    levothyroxine (SYNTHROID, LEVOTHROID) 50 MCG tablet, Take 1 tablet by mouth daily. 6 days a week, not on Sundays, Disp: ,  Rfl: ;    Multiple Vitamin (MULTIVITAMIN) tablet, Take 1 tablet by mouth daily. , Disp: , Rfl: ;    Results for orders placed during the hospital encounter of 05/17/12 (from the past 48 hour(s))  ABO/RH     Status: Normal   Collection Time   05/17/12  1:24 PM      Component Value Range Comment   ABO/RH(D) O POS     CBC     Status: Abnormal   Collection Time   05/17/12  1:54 PM      Component Value Range Comment   WBC 7.4  4.0 - 10.5 K/uL    RBC 4.89  3.87 - 5.11 MIL/uL    Hemoglobin 15.0  12.0 - 15.0 g/dL    HCT 16.1  09.6 - 04.5 %    MCV 89.4  78.0 - 100.0 fL    MCH 30.7  26.0 - 34.0 pg    MCHC 34.3  30.0 - 36.0 g/dL    RDW 40.9  81.1 - 91.4 %    Platelets 82 (*) 150 - 400 K/uL   DIFFERENTIAL     Status: Abnormal   Collection Time   05/17/12  1:54 PM      Component Value Range Comment   Neutrophils Relative 83 (*) 43 - 77 %    Lymphocytes Relative 10 (*) 12 - 46 %    Monocytes Relative 7  3 - 12 %      Eosinophils Relative 0  0 - 5 %    Basophils Relative 0  0 - 1 %    Neutro Abs 6.2  1.7 - 7.7 K/uL    Lymphs Abs 0.7  0.7 - 4.0 K/uL    Monocytes Absolute 0.5  0.1 - 1.0 K/uL    Eosinophils Absolute 0.0  0.0 - 0.7 K/uL    Basophils Absolute 0.0  0.0 - 0.1 K/uL    Smear Review MORPHOLOGY UNREMARKABLE     COMPREHENSIVE METABOLIC PANEL     Status: Abnormal   Collection Time   05/17/12  1:54 PM      Component Value Range Comment   Sodium 141  135 - 145 mEq/L    Potassium 3.8  3.5 - 5.1 mEq/L    Chloride 103  96 - 112 mEq/L    CO2 30  19 - 32 mEq/L    Glucose, Bld 129 (*) 70 - 99 mg/dL    BUN 13  6 - 23 mg/dL    Creatinine, Ser 7.82  0.50 - 1.10 mg/dL    Calcium 9.8  8.4 - 95.6 mg/dL    Total Protein 6.8  6.0 - 8.3 g/dL    Albumin 3.9  3.5 - 5.2 g/dL    AST 22  0 - 37 U/L    ALT 13  0 - 35 U/L    Alkaline Phosphatase 73  39 - 117 U/L    Total Bilirubin 0.6  0.3 - 1.2 mg/dL    GFR calc non Af Amer 74 (*) >90 mL/min    GFR calc Af Amer 86 (*) >90 mL/min   TYPE AND SCREEN     Status: Normal   Collection Time   05/17/12  1:54 PM      Component Value Range Comment   ABO/RH(D) O POS      Antibody Screen NEG      Sample Expiration 05/20/2012     DIGOXIN LEVEL     Status: Abnormal   Collection  Time   05/17/12  1:54 PM      Component Value Range Comment   Digoxin Level 0.7 (*) 0.8 - 2.0 ng/mL   PROTIME-INR     Status: Abnormal   Collection Time   05/17/12  1:54 PM      Component Value Range Comment   Prothrombin Time 15.3 (*) 11.6 - 15.2 seconds    INR 1.18  0.00 - 1.49   URINALYSIS, ROUTINE W REFLEX MICROSCOPIC     Status: Abnormal   Collection Time   05/17/12  2:39 PM      Component Value Range Comment   Color, Urine YELLOW  YELLOW    APPearance CLOUDY (*) CLEAR    Specific Gravity, Urine 1.016  1.005 - 1.030    pH 7.5  5.0 - 8.0    Glucose, UA NEGATIVE  NEGATIVE mg/dL    Hgb urine dipstick NEGATIVE  NEGATIVE    Bilirubin Urine NEGATIVE  NEGATIVE    Ketones, ur NEGATIVE   NEGATIVE mg/dL    Protein, ur NEGATIVE  NEGATIVE mg/dL    Urobilinogen, UA 0.2  0.0 - 1.0 mg/dL    Nitrite NEGATIVE  NEGATIVE    Leukocytes, UA NEGATIVE  NEGATIVE MICROSCOPIC NOT DONE ON URINES WITH NEGATIVE PROTEIN, BLOOD, LEUKOCYTES, NITRITE, OR GLUCOSE <1000 mg/dL.    Dg Hip Complete Right  05/17/2012  *RADIOLOGY REPORT*  Clinical Data: Fall with right hip pain and deformity.  RIGHT HIP - COMPLETE 2+ VIEW  Comparison: None  Findings: A mildly comminuted intertrochanteric fracture is identified with varus angulation.  The lesser trochanter fragment is displaced by 5 mm. There is no evidence of subluxation or dislocation. Moderate to severe degenerative changes in both hips are present. An intramedullary rod and screw within the proximal left femur is identified.  IMPRESSION: Intertrochanteric right femur fracture with varus angulation.  Original Report Authenticated By: Rosendo Gros, M.D.   EKG: EKG done today looks similar to previous ekg's done earlier this year.  No acute changes noted. AFib noted on EKG  Review of Systems  Constitutional: Negative for fever and chills.  Respiratory: Negative for cough and shortness of breath.   Cardiovascular: Negative for chest pain.  Gastrointestinal: Negative for nausea and vomiting.  Musculoskeletal:       R hip pain  Neurological:       Instability with walker  Psychiatric/Behavioral: Positive for memory loss.   Blood pressure 133/80, pulse 84, temperature 97.7 F (36.5 C), temperature source Oral, resp. rate 16, SpO2 95.00%. Physical Exam  Constitutional: Vital signs are normal. She is active and cooperative.       Frail appearing elderly female but pleasant   HENT:  Head: Normocephalic and atraumatic.  Mouth/Throat: Mucous membranes are dry.  Eyes: EOM are normal.  Neck: Normal range of motion and full passive range of motion without pain. Neck supple.  Cardiovascular: An irregularly irregular rhythm present.  Pulses:      Dorsalis  pedis pulses are 2+ on the right side, and 2+ on the left side.  Respiratory:       CTA bilateral   GI: Soft. Normal appearance. There is no tenderness.       Bowel sounds are present  Genitourinary:       Foley catheter in place  Musculoskeletal:       Bilateral upper extremities   Unremarkable for acute findings   IV lines noted   No pain with palpation   Full, unrestricted AROM  Motor and sensory functions intact   Ext are warm with + radial pulses B  Pelvis   No instability   No tenderness  Left Lower Extremity   No acute findings   Old hip surgical wounds healed  Right Lower Extremity   R leg is short and externally rotated   TTP R hip   Skin over R hip is intact   Knee non-tender, no instability   Tibia, ankle and foot are without tenderness   Pt does have lower extremity swelling, which is symmetric to contra-lateral side,  Likely due to comorbidities   DPN, SPN, TN sensation grossly intact   EHL, FHL, AT, PT, Peroneals, gastroc motor intact   No DCT   Compartments soft and nonteder    Neurological: She is alert.    Assessment/Plan:  78 y/o female s/p ground level fall with R hip fx, complicated by medical comorbidities  1. R intertrochanteric femur fracture s/p ground level fall  OR today for R IMN  Pt will likely need SNF or CIR, family may want to consider ALF or dementia unit  Will get SW involved  Hopeful to allow pt to [facility] for transfers on R leg after surgery   No ROM restrictions after surgery  Check knee film before surgery 2. Pain  Ortho will manage pain  Will start with IV APAP x 24 hours and  Will use very low dose oxy ir for break through pain along with low dose IV morphine  After IV tylenol will transition to po tylenol and switch to low dose norco for breakthrough pain  Limit any other CNS acting agents to minimize changes for changes in mental status, which are likely to occur during hospital stay  3. DVT/PE prophylaxis  Lovenox post  op x 21 days start tomorrow  4. Medical issues per primary team 5. FEN  NPO for now  LR ivf 6. Metabolic bone disease  Will check labs as inpatient to identify any additional correctable factors  Calcium and vitamin D daily  Multivitamin daily  Chronic lasix does also increase chances of bone loss  Will give 1 x dose of vitamin D2 50,000 IU's post surgery as well 7. Dispo  OR today for IMN  Appreciate opportunity to care for patient  Mearl Latin, PA-C [facility] [phone] (P) 05/17/2012, 4:29 PM

## 2012-05-17 NOTE — Anesthesia Preprocedure Evaluation (Addendum)
Anesthesia Evaluation    History of Anesthesia Complications (+) AWARENESS UNDER ANESTHESIA  Airway Mallampati: II TM Distance: >3 FB Neck ROM: Full    Dental No notable dental hx.    Pulmonary  breath sounds clear to auscultation  Pulmonary exam normal       Cardiovascular hypertension, Pt. on medications + Past MI + dysrhythmias (borderline rate control) Atrial Fibrillation Rhythm:Regular Rate:Normal     Neuro/Psych PSYCHIATRIC DISORDERS Anxiety Dementia. HOH    GI/Hepatic   Endo/Other  Hypothyroidism   Renal/GU      Musculoskeletal   Abdominal   Peds  Hematology   Anesthesia Other Findings Upper left lower tooth  Reproductive/Obstetrics                         Anesthesia Physical Anesthesia Plan  ASA: III  Anesthesia Plan: Spinal   Post-op Pain Management:    Induction: Intravenous  Airway Management Planned: Simple Face Mask  Additional Equipment:   Intra-op Plan:   Post-operative Plan: Extubation in OR  Informed Consent: I have reviewed the patients History and Physical, chart, labs and discussed the procedure including the risks, benefits and alternatives for the proposed anesthesia with the patient or authorized representative who has indicated his/her understanding and acceptance.   Dental advisory given  Plan Discussed with: CRNA  Anesthesia Plan Comments: (Will give IV dig for rate control if necessary.)        Anesthesia Quick Evaluation

## 2012-05-17 NOTE — Brief Op Note (Signed)
05/17/2012  8:07 PM  PATIENT:  Gayla Medicus  78 y.o. female  PRE-OPERATIVE DIAGNOSIS:  fracture right hip  POST-OPERATIVE DIAGNOSIS:  fracture right hip  PROCEDURE:  Procedure(s) (LRB): INTRAMEDULLARY (IM) NAIL FEMORAL (Right) w Gamma 125 degree, , statically locked, 85mm lag screw  SURGEON:  Surgeon(s) and Role:    * Budd Palmer, MD - Primary  PHYSICIAN ASSISTANT: Montez Morita, [facility]  ANESTHESIA:   Spinal  EBL:  Total I/O In: -  Out: 300 [Urine:100; Blood:200]  BLOOD ADMINISTERED:none  DRAINS: none   LOCAL MEDICATIONS USED:  NONE  SPECIMEN:  No Specimen  DISPOSITION OF SPECIMEN:  N/A  COUNTS:  YES  TOURNIQUET:  * No tourniquets in log *  DICTATION: .Other Dictation: Dictation Number [id number]  PLAN OF CARE: Admit to inpatient   PATIENT DISPOSITION:  PACU - hemodynamically stable.   Delay start of Pharmacological VTE agent (>24hrs) due to surgical blood loss or risk of bleeding: no

## 2012-05-17 NOTE — Preoperative (Signed)
Beta Blockers   Reason not to administer Beta Blockers:Atenolol taken at 0900 05-17-12 per pt family

## 2012-05-17 NOTE — ED Provider Notes (Signed)
History     CSN: [id number]  Arrival date & time 05/17/12  1247   First MD Initiated Contact with Patient 05/17/12 1303      Chief Complaint  Patient presents with  . Fall   level V caveat due to dementia.  (Consider location/radiation/quality/duration/timing/severity/associated sxs/prior treatment) Patient is a 78 y.o. female presenting with fall. The history is provided by the patient and a relative.  Fall The accident occurred less than 1 hour ago.   patient reportedly fell. She has dementia and does not know how it happened. She's only complaining of pain in her right hip. No head pain no chest or abdominal pain.  Past Medical History  Diagnosis Date  . Anxiety   . HTN (hypertension)   . Atrial fibrillation   . Dementia   . Tremor   . History of pneumonia   . Hypothyroid   . Osteoporosis     prev intolerant of alendronate  . Myocardial infarction   . Hypothyroidism   . Anemia   . Hypertension   . Closed left hip fracture   . Arthritis   . DEMENTIA     Past Surgical History  Procedure Date  . Head surgery     for trigeminal neuralgia  . Total hip arthroplasty   . Cataract extraction   . Appendectomy   . Cataract extraction, bilateral   . Right rotator cuff repair   . Screw to left hip fracture     Family History  Problem Relation Age of Onset  . Cancer    . Cancer Sister     breast cancer  . Cancer Brother     lung cancer    History  Substance Use Topics  . Smoking status: Never Smoker   . Smokeless tobacco: Never Used  . Alcohol Use: No    OB History    Grav Para Term Preterm Abortions TAB SAB Ect Mult Living                  Review of Systems  Unable to perform ROS   Allergies  Alendronate sodium; Aricept; and Warfarin sodium  Home Medications   Current Outpatient Rx  Name Route Sig Dispense Refill  . ATENOLOL 25 MG PO TABS Oral Take 25 mg by mouth 2 (two) times daily.    . OYSTER SHELL CALCIUM 500 + D PO Oral Take 1 tablet by  mouth daily.    Marland Kitchen DIGOXIN 0.125 MG PO TABS Oral Take 125 mcg by mouth daily.    Marland Kitchen FERROUS SULFATE 325 (65 FE) MG PO TABS Oral Take 325 mg by mouth 2 (two) times daily.      . FUROSEMIDE 20 MG PO TABS  ONE TABLET 5 X WEEKLY OR AS DIRECTED. 30 tablet 6  . LEVOTHYROXINE SODIUM 50 MCG PO TABS Oral Take 1 tablet by mouth daily. 6 days a week, not on Sundays    . ONE-DAILY MULTI VITAMINS PO TABS Oral Take 1 tablet by mouth daily.       BP 133/80  Pulse 84  Temp 97.7 F (36.5 C) (Oral)  Resp 16  SpO2 95%  Physical Exam  Constitutional: She appears well-developed and well-nourished.  HENT:  Head: Normocephalic and atraumatic.  Eyes: EOM are normal. Pupils are equal, round, and reactive to light.  Neck: Normal range of motion. Neck supple.  Cardiovascular: Normal rate.   Pulmonary/Chest: Effort normal and breath sounds normal. No respiratory distress.  Abdominal: Soft. She exhibits no distension.  There is no tenderness.  Musculoskeletal:       Tenderness to right hip. Pain with movement. Neurovascular intact distally.    ED Course  Procedures (including critical care time)  Labs Reviewed  CBC - Abnormal; Notable for the following:    Platelets 82 (*)     All other components within normal limits  DIFFERENTIAL - Abnormal; Notable for the following:    Neutrophils Relative 83 (*)     Lymphocytes Relative 10 (*)     All other components within normal limits  COMPREHENSIVE METABOLIC PANEL - Abnormal; Notable for the following:    Glucose, Bld 129 (*)     GFR calc non Af Amer 74 (*)     GFR calc Af Amer 86 (*)     All other components within normal limits  URINALYSIS, ROUTINE W REFLEX MICROSCOPIC - Abnormal; Notable for the following:    APPearance CLOUDY (*)     All other components within normal limits  DIGOXIN LEVEL - Abnormal; Notable for the following:    Digoxin Level 0.7 (*)     All other components within normal limits  PROTIME-INR - Abnormal; Notable for the following:      Prothrombin Time 15.3 (*)     All other components within normal limits  TYPE AND SCREEN  ABO/RH   Dg Hip Complete Right  05/17/2012  *RADIOLOGY REPORT*  Clinical Data: Fall with right hip pain and deformity.  RIGHT HIP - COMPLETE 2+ VIEW  Comparison: None  Findings: A mildly comminuted intertrochanteric fracture is identified with varus angulation.  The lesser trochanter fragment is displaced by 5 mm. There is no evidence of subluxation or dislocation. Moderate to severe degenerative changes in both hips are present. An intramedullary rod and screw within the proximal left femur is identified.  IMPRESSION: Intertrochanteric right femur fracture with varus angulation.  Original Report Authenticated By: Rosendo Gros, M.D.     1. Hip fracture   2. Atrial fibrillation     Date: 05/17/2012  Rate: 116  Rhythm: atrial fibrillation  QRS Axis: normal  Intervals: normal  ST/T Wave abnormalities: normal  Conduction Disutrbances:none  Narrative Interpretation: In A. fib with changes likely due to digoxin. Rate has increased since last EKG.  Old EKG Reviewed: changes noted     MDM  Patient had a mechanical fall with right intertrochanteric hip fracture. Dr. handy will consult on the patient be admitted to medicine.        Juliet Rude. Rubin Payor, MD 05/17/12 [phone]

## 2012-05-17 NOTE — Anesthesia Procedure Notes (Signed)
Spinal  Patient location during procedure: OR Staffing Anesthesiologist: Vitalia Stough Performed by: anesthesiologist  Preanesthetic Checklist Completed: patient identified, site marked, surgical consent, pre-op evaluation, timeout performed, IV checked, risks and benefits discussed and monitors and equipment checked Spinal Block Patient position: sitting Prep: Betadine Patient monitoring: heart rate, continuous pulse ox and blood pressure Approach: right paramedian Location: L2-3 Injection technique: single-shot Needle Needle type: Spinocan  Needle gauge: 22 G Needle length: 9 cm Additional Notes Expiration date of kit checked and confirmed. Patient tolerated procedure well, without complications.     

## 2012-05-17 NOTE — ED Notes (Signed)
EMS called to home.  Found patient in bed after fall.  Patient has no LOC.  Remembers falling but does not know how it happened Patient has deformity of the right hip.  Pain rated 10 of 10.   Shortening of the right leg.  EMS administered total of 200 mcg fentanyl Patient stated she felt relief

## 2012-05-18 ENCOUNTER — Inpatient Hospital Stay (HOSPITAL_COMMUNITY): Payer: Medicare Other

## 2012-05-18 DIAGNOSIS — D62 Acute posthemorrhagic anemia: Secondary | ICD-10-CM

## 2012-05-18 DIAGNOSIS — S72143A Displaced intertrochanteric fracture of unspecified femur, initial encounter for closed fracture: Secondary | ICD-10-CM

## 2012-05-18 DIAGNOSIS — I10 Essential (primary) hypertension: Secondary | ICD-10-CM

## 2012-05-18 DIAGNOSIS — R000 Tachycardia, unspecified: Secondary | ICD-10-CM

## 2012-05-18 LAB — COMPREHENSIVE METABOLIC PANEL
ALT: 10 U/L (ref 0–35)
Alkaline Phosphatase: 54 U/L (ref 39–117)
GFR calc Af Amer: 67 mL/min — ABNORMAL LOW (ref >90)
Glucose, Bld: 113 mg/dL — ABNORMAL HIGH (ref 70–99)
Potassium: 4.8 mEq/L (ref 3.5–5.1)
Sodium: 138 mEq/L (ref 135–145)
Total Protein: 5.3 g/dL — ABNORMAL LOW (ref 6.0–8.3)

## 2012-05-18 LAB — CBC
HCT: 29.3 % — ABNORMAL LOW (ref 36.0–46.0)
Hemoglobin: 11.4 g/dL — ABNORMAL LOW (ref 12.0–15.0)
MCH: 30.5 pg (ref 26.0–34.0)
MCHC: 33.3 g/dL (ref 30.0–36.0)
MCV: 91.3 fL (ref 78.0–100.0)
RBC: 3.8 MIL/uL — ABNORMAL LOW (ref 3.87–5.11)
RDW: 13.6 % (ref 11.5–15.5)
WBC: 10.4 10*3/uL (ref 4.0–10.5)
WBC: 7.6 10*3/uL (ref 4.0–10.5)

## 2012-05-18 LAB — TSH: TSH: 0.388 u[IU]/mL (ref 0.350–4.500)

## 2012-05-18 LAB — CARDIAC PANEL(CRET KIN+CKTOT+MB+TROPI)
Relative Index: 3.3 — ABNORMAL HIGH (ref 0.0–2.5)
Relative Index: 3.4 — ABNORMAL HIGH (ref 0.0–2.5)
Troponin I: <0.3 ng/mL (ref <0.30)

## 2012-05-18 MED ORDER — SODIUM CHLORIDE 0.9 % IV BOLUS (SEPSIS)
250.0000 mL | Freq: Once | INTRAVENOUS | Status: AC
  Administered 2012-05-18: 250 mL via INTRAVENOUS

## 2012-05-18 MED ORDER — SODIUM CHLORIDE 0.9 % IV BOLUS (SEPSIS)
500.0000 mL | Freq: Once | INTRAVENOUS | Status: AC
  Administered 2012-05-18: 500 mL via INTRAVENOUS

## 2012-05-18 MED ORDER — BIOTENE DRY MOUTH MT LIQD
15.0000 mL | Freq: Two times a day (BID) | OROMUCOSAL | Status: DC
  Administered 2012-05-18 – 2012-05-20 (×4): 15 mL via OROMUCOSAL

## 2012-05-18 MED ORDER — CHLORHEXIDINE GLUCONATE 0.12 % MT SOLN
15.0000 mL | Freq: Two times a day (BID) | OROMUCOSAL | Status: DC
  Administered 2012-05-18 – 2012-05-21 (×7): 15 mL via OROMUCOSAL
  Filled 2012-05-18 (×9): qty 15

## 2012-05-18 MED ORDER — SODIUM CHLORIDE 0.9 % IV BOLUS (SEPSIS)
500.0000 mL | Freq: Once | INTRAVENOUS | Status: AC
Start: 2012-05-18 — End: 2012-05-18
  Administered 2012-05-18: 500 mL via INTRAVENOUS

## 2012-05-18 NOTE — Progress Notes (Signed)
After 2nd NS bolus 500cc rechecked BP in 2hrs 84/49 HR-94. Urine output in 9hrs 255, foley inplace, bladder scan highest 52. Dr Gwenlyn Perking informed again, increased contnous IVF to 100 and to recheck H/H tonight. Patient asymptomatic. While resting no pain but with activity C/O right hip/leg pain  Will continue to assess patient.

## 2012-05-18 NOTE — Progress Notes (Signed)
CSW provided list of bed offers for SNF facilities.   Weekday CSW to f/u for bed selection and further d/c planning.    Simona Rocque, LCSWA  Weekend Coverage [phone]    

## 2012-05-18 NOTE — Progress Notes (Signed)
After NS bolus, blood pressure still 78/42, and new/fresh  bright red blood moderate amount noted on the surgical incision dressing on the right hip. Dr Gwenlyn Perking notified of BP and blood, ordered NS bolus. Also notified Charlyne Petrin with orthopedics of the bleeding from the surgical sight, stated to apple another dressing and continue to monitor patient. Patient looking pale but denies any distress. F/U with plan of care.

## 2012-05-18 NOTE — Op Note (Signed)
[name], [name] NO.:  [id number]  MEDICAL RECORD NO.:  [id number]  LOCATION:  1425                         FACILITY:  [facility]  PHYSICIAN:  Doralee Albino. Carola Frost, M.D. DATE OF BIRTH:  02-24-1922  DATE OF PROCEDURE:  05/17/2012 DATE OF DISCHARGE:                              OPERATIVE REPORT   PREOPERATIVE DIAGNOSIS:  Right intertrochanteric hip fracture.  POSTOPERATIVE DIAGNOSIS:  Right intertrochanteric hip fracture.  PROCEDURE:  Intramedullary nailing of the right hip using a Stryker Gamma nail, 125 degree, 13 x 380 mm statically locked nail with a 85-mm lag screw.  SURGEON:  Doralee Albino. Carola Frost, M.D.  ASSISTANT:  Mearl Latin, PA-C  ANESTHESIA:  Spinal.  ESTIMATED BLOOD LOSS:  200 mL of reamings and hematoma.  BLOOD ADMINISTERED:  None.  URINARY OUTPUT:  100 mL.  DISPOSITION:  To PACU.  CONDITION:  Stable.  BRIEF SUMMARY AND INDICATION FOR PROCEDURE:  [name] is an very pleasant 78 year old female who fell in the yard today while she was using a rolling walker inserts for her son.  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  She had pain and inability to bear weight subsequent to her fall and x-rays demonstrated a displaced proximal femur fracture.  She is admitted to the Medical Service, and I did not feel that any further testing or intervention was warranted prior to surgery.  I discussed with the family the risks and benefits of surgery including the possibility of nonunion, malunion, hardware that symptomatic, need for further surgery, and multiple others and they are quite familiar with these given the prior procedure on the contralateral side.  Furthermore, they specifically understood the risk of heart attack, stroke, and death.  BRIEF SUMMARY OF PROCEDURE:  [name] was taken to the operating room, where spinal anesthesia was administered.  She was then  placed supine on the operative traction table with all prominences padded appropriately.  The right leg was in the lithotomy position.  The right hip was then reduced in closed fashion, confirmed with orthogonal C-arm images.  A standard prep and drape was performed and then instruments used to localize the appropriate starting trajectory and placement of incisions.  The canal was sequentially reamed to 14.5 mm after initiating the entry just lateral to the down slope of the greater trochanter.  I was careful not to ream out the lateral cortex proximally.  The nail was inserted to the appropriate depth and percutaneous incision was made for lag screw in the femoral head and this was watched on AP and lateral images to get the best position for the lag screw, slightly inferior and directly in the center with regards to anterior-posterior placement.  Following placement of a 85-mm lag screw, the traction was released and the hip compressed using the compression device.  This followed by placement of a single static lock in the proximal hole using perfect circle technique.  All wounds were irrigated and closed in standard layered fashion with 0 Vicryl, 2-0 Vicryl, and staples.  Sterile gently compressive dressing was applied. The patient was awakened from anesthesia and transported to  the PACU in stable condition.  Amy Morita, PA-C, assisted me throughout the procedure.  PROGNOSIS:  The patient will be weightbearing as tolerated with Physical and Occupational Therapy assistance.  We anticipate a discharge back to home with Home Health if the patient progresses sufficiently with therapy; otherwise, skilled nursing will be required.  The patient remains at elevated risk for perioperative and postoperative complications given her multiple medical comorbidities, which include dementia and AFib for which she is no longer a anticoagulation candidate and others.     Doralee Albino. Carola Frost,  M.D.     MHH/MEDQ  D:  05/17/2012  T:  05/17/2012  Job:  [id number]

## 2012-05-18 NOTE — Progress Notes (Signed)
Patient blood pressure 76/40 HR-98 patient asymptomatic denies any distress. Dr. Gwenlyn Perking and Dr. Carola Frost notified. Dr. Gwenlyn Perking ordered to hold  Atenolol and bolus 500cc of Arthur saline and recheck BP in 2hrs. Will continue to asses patient.

## 2012-05-18 NOTE — Progress Notes (Addendum)
Pt had 2.24 sec pause. BP is 88/58 and HR is 75. Paged T. Claiborne Billings, NP, new orders received. Will continue to monitor pt. Mechele Collin

## 2012-05-18 NOTE — Progress Notes (Addendum)
Subjective: No pain; no overnight events. Feeling ok.  Objective: Vital signs in last 24 hours: Temp:  [96.7 F (35.9 C)-98.3 F (36.8 C)] 98.1 F (36.7 C) (06/23 0540) Pulse Rate:  [61-112] 91  (06/23 0540) Resp:  [12-30] 18  (06/23 0800) BP: (83-149)/(45-84) 99/64 mmHg (06/23 0540) SpO2:  [88 %-100 %] 99 % (06/23 0800) Weight:  [53.524 kg (118 lb)] 53.524 kg (118 lb) (06/22 2220) Weight change:  Last BM Date: 05/17/12  Intake/Output from previous day: 06/22 0701 - 06/23 0700 In: 2411.7 [P.O.:120; I.V.:2041.7; IV Piggyback:250] Out: 375 [Urine:175; Blood:200]     Physical Exam: General: Alert, awake, oriented x2, in no acute distress. HEENT: No bruits, no goiter. Heart: Regular rate and rhythm, without murmurs, rubs, gallops. Lungs: Clear to auscultation bilaterally. Abdomen: Soft, nontender, nondistended, positive bowel sounds. Extremities: No clubbing, cyanosis or edema; denies any pain; dressings on lateral aspect of right hip with minimal snaguinous discharge. Good pedal pulses appreciated bilaterally. Neuro: Grossly intact, nonfocal.   Lab Results: Basic Metabolic Panel:  Basename 05/18/12 0655 05/17/12 2348 05/17/12 1354  NA 138 -- 141  K 4.8 -- 3.8  CL 103 -- 103  CO2 30 -- 30  GLUCOSE 113* -- 129*  BUN 15 -- 13  CREATININE 0.87 -- 0.75  CALCIUM 9.0 -- 9.8  MG 1.9 2.0 --  PHOS -- 3.9 --   Liver Function Tests:  Basename 05/18/12 0655 05/17/12 1354  AST 21 22  ALT 10 13  ALKPHOS 54 73  BILITOT 0.8 0.6  PROT 5.3* 6.8  ALBUMIN 3.0* 3.9   CBC:  Basename 05/18/12 0655 05/17/12 1354  WBC 10.4 7.4  NEUTROABS -- 6.2  HGB 11.4* 15.0  HCT 34.2* 43.7  MCV 90.0 89.4  PLT 66* 82*   Cardiac Enzymes:  Basename 05/18/12 0655 05/17/12 2348  CKTOTAL 175 136  CKMB 5.7* 4.6*  CKMBINDEX -- --  TROPONINI <0.30 <0.30   Coagulation:  Basename 05/17/12 1354  LABPROT 15.3*  INR 1.18   Urinalysis:  Basename 05/17/12 1439  COLORURINE YELLOW  LABSPEC  1.016  PHURINE 7.5  GLUCOSEU NEGATIVE  HGBUR NEGATIVE  BILIRUBINUR NEGATIVE  KETONESUR NEGATIVE  PROTEINUR NEGATIVE  UROBILINOGEN 0.2  NITRITE NEGATIVE  LEUKOCYTESUR NEGATIVE    Studies/Results: Dg Hip Complete Right  05/17/2012  *RADIOLOGY REPORT*  Clinical Data: Fall with right hip pain and deformity.  RIGHT HIP - COMPLETE 2+ VIEW  Comparison: None  Findings: A mildly comminuted intertrochanteric fracture is identified with varus angulation.  The lesser trochanter fragment is displaced by 5 mm. There is no evidence of subluxation or dislocation. Moderate to severe degenerative changes in both hips are present. An intramedullary rod and screw within the proximal left femur is identified.  IMPRESSION: Intertrochanteric right femur fracture with varus angulation.  Original Report Authenticated By: Rosendo Gros, M.D.   Dg Femur Right  05/17/2012  *RADIOLOGY REPORT*  Clinical Data: Right hip fracture.  RIGHT FEMUR - 2 VIEW  Comparison: 05/17/2012  Findings: Four intraoperative spot images demonstrate internal fixation of the right intertrochanteric fracture.  Near anatomic alignment.  No hardware complicating feature.  IMPRESSION: Internal fixation of the right intertrochanteric fracture with near anatomic alignment.  Original Report Authenticated By: Cyndie Chime, M.D.   Dg Pelvis Portable  05/17/2012  *RADIOLOGY REPORT*  Clinical Data: Postop right hip ORIF.  PORTABLE PELVIS  Comparison: 05/17/2012  Findings: Changes of remote left hip ORIF.  New right hip ORIF with anatomic alignment.  Degenerative changes in the  hips.  No hardware complicating feature.  IMPRESSION: New changes of right hip ORIF.  Near anatomic alignment.  Original Report Authenticated By: Cyndie Chime, M.D.   Dg Hip Portable 1 View Right  05/17/2012  *RADIOLOGY REPORT*  Clinical Data: Postop right hip.  PORTABLE RIGHT HIP - 1 VIEW  Comparison: 05/17/2012  Findings: Changes of right hip ORIF.  Normal alignment.  No  hardware or bony complicating feature.  Advanced degenerative changes in the right hip and knee.  IMPRESSION: ORIF right intertrochanteric fracture with anatomic alignment.  Original Report Authenticated By: Cyndie Chime, M.D.   Dg Knee Right Port  05/17/2012  *RADIOLOGY REPORT*  Clinical Data: Fall  PORTABLE RIGHT KNEE - 1-2 VIEW  Comparison: None.  Findings: Advanced degenerative change in  all three compartments of the knee with cartilage loss, subchondral  irregularity, and spurring.  There is chondrocalcinosis medially.  Loose bodies are present in the knee joint.  There is generalized osteopenia.  Negative for fracture.  IMPRESSION: Advanced degenerative change without fracture.  Original Report Authenticated By: Camelia Phenes, M.D.   Dg C-arm 61-120 Min-no Report  05/17/2012  CLINICAL DATA: right hip fracture   C-ARM 61-120 MINUTES  Fluoroscopy was utilized by the requesting physician.  No radiographic  interpretation.      Medications: Scheduled Meds:   . acetaminophen  1,000 mg Intravenous Q6H  . antiseptic oral rinse  15 mL Mouth Rinse q12n4p  . atenolol  25 mg Oral BID  . calcium-vitamin D  1 tablet Oral Daily  .  ceFAZolin (ANCEF) IV  1 g Intravenous Q8H  . chlorhexidine  15 mL Mouth Rinse BID  . digoxin  125 mcg Oral Daily  . docusate sodium  100 mg Oral BID  . fentaNYL  50 mcg Intravenous Once  . fentaNYL  50 mcg Intravenous Once  . ferrous sulfate  325 mg Oral BID PC  . levothyroxine  50 mcg Oral QAC breakfast  . meperidine      . multivitamin with minerals  1 tablet Oral Daily  . ondansetron (ZOFRAN) IV  4 mg Intravenous Once  . sodium chloride  500 mL Intravenous Once  . sodium chloride  3 mL Intravenous Q12H  . Vitamin D (Ergocalciferol)  50,000 Units Oral Q7 days  . DISCONTD: digoxin  0.25 mg Intravenous To PACU  . DISCONTD: digoxin  250 mcg Intravenous Once   Continuous Infusions:   . sodium chloride 50 mL/hr at 05/18/12 0823  . DISCONTD: lactated ringers       PRN Meds:.acetaminophen, acetaminophen, alum & mag hydroxide-simeth, bisacodyl, menthol-cetylpyridinium, metoCLOPramide (REGLAN) injection, metoCLOPramide, morphine injection, ondansetron (ZOFRAN) IV, ondansetron, oxyCODONE, phenol, polyethylene glycol, DISCONTD:  HYDROmorphone (DILAUDID) injection, DISCONTD: meperidine (DEMEROL) injection, DISCONTD: ondansetron (ZOFRAN) IV, DISCONTD: ondansetron, DISCONTD: oxyCODONE, DISCONTD: polyethylene glycol DISCONTD: promethazine  Assessment/Plan: 1-Hip fracture, intertrochanteric: patient experienced mechanical fall at her yard; no LOC, palpitation, CP or SOB precipitating the fall. S/o surgical repair, no pain and feeling ok. Postoperative recommendations per Ortho. Especially weight bearing, PT/OT and any short term anticoagulation to prevent DVT/PE.  2-HTN (hypertension): stable will continue current medication regimen.   3-Dementia: severe, but stable. Main problem is short term memory. Patient AAOX#; will provide supportive care.   4-Atrial fibrillation: soft but stable; will hold b-blocker if needed; continue digoxin; not a candidate for anticoagulation therapy   5-Osteoporosis: continue vit D and calcium.   6-Hypothyroidism: will follow results of TSH and will continue synthroid.   7-Hx of Anemia: continue  ferrous sulfate; Hgb 11.4; will follow hgb trend.   8-Black stool: most likely due to iron therapy; will guaiac stool x3 and follow Hgb.   9-elevated CKMB: most likely associated with trauma and surgery. No CP and negative troponin; a;so no changes on telemetry or EKG suspicious for acute ischemia. Will continue monitoring.    LOS: 1 day   [name] [facility] [phone]  05/18/2012, 9:39 AM

## 2012-05-18 NOTE — Progress Notes (Signed)
PT Cancellation Note  Treatment cancelled today due to medical issues with patient which prohibited therapy.  Pts BP was 75/34 supine in bed with pt appearing very pale and lethargic, RN notified, therefore deferred therapy until BP is elevated.    Thanks,   Page, Meribeth Mattes 05/18/2012, 2:09 PM

## 2012-05-18 NOTE — Progress Notes (Signed)
Patient blood pressure improving, 97/57. Patient in bed resting, no C/O any distress. Reported off to night shift RN.

## 2012-05-18 NOTE — Progress Notes (Signed)
POD 1 s/p im nailing of r hip  All labs and vitals reviewed; notable for stable H/H with decreased BP X-rays excellent without complication  Re-check now 82/47; atenolol being held Patient denies being light-headed; does recall her hip being repaired yesterday, not somnolent RLE drainage from proximal hip incision only, moderate; none from mid or distal incisions  Intact DPN/SPN/TN sens and mot; PT palp  Plan Have clarified order for PT as WBAT without ROM precautions but suspect BP may limit participation there today Will recheck H/H next draw Dr. Gwenlyn Perking in process of being contacted by nursing for additional recs as well Drsg changes prn  Myrene Galas, MD

## 2012-05-18 NOTE — Anesthesia Postprocedure Evaluation (Signed)
  Anesthesia Post-op Note  Patient: [name]  Procedure(s) Performed: Procedure(s) (LRB): INTRAMEDULLARY (IM) NAIL FEMORAL (Right)  Patient Location: PACU  Anesthesia Type: Spinal  Level of Consciousness: awake and alert   Airway and Oxygen Therapy: Patient Spontanous Breathing  Post-op Pain: mild  Post-op Assessment: Post-op Vital signs reviewed, Patient's Cardiovascular Status Stable, Respiratory Function Stable, Patent Airway and No signs of Nausea or vomiting  Post-op Vital Signs: stable  Complications: No apparent anesthesia complications

## 2012-05-19 DIAGNOSIS — I10 Essential (primary) hypertension: Secondary | ICD-10-CM

## 2012-05-19 DIAGNOSIS — D62 Acute posthemorrhagic anemia: Secondary | ICD-10-CM

## 2012-05-19 DIAGNOSIS — R000 Tachycardia, unspecified: Secondary | ICD-10-CM

## 2012-05-19 DIAGNOSIS — S72143A Displaced intertrochanteric fracture of unspecified femur, initial encounter for closed fracture: Secondary | ICD-10-CM

## 2012-05-19 LAB — CBC
HCT: 31.2 % — ABNORMAL LOW (ref 36.0–46.0)
Hemoglobin: 10.2 g/dL — ABNORMAL LOW (ref 12.0–15.0)
MCH: 30.2 pg (ref 26.0–34.0)
MCHC: 32.7 g/dL (ref 30.0–36.0)
RDW: 14 % (ref 11.5–15.5)

## 2012-05-19 LAB — BASIC METABOLIC PANEL
BUN: 14 mg/dL (ref 6–23)
Calcium: 8.3 mg/dL — ABNORMAL LOW (ref 8.4–10.5)
GFR calc Af Amer: 61 mL/min — ABNORMAL LOW (ref >90)
GFR calc non Af Amer: 53 mL/min — ABNORMAL LOW (ref >90)
Glucose, Bld: 92 mg/dL (ref 70–99)
Sodium: 139 mEq/L (ref 135–145)

## 2012-05-19 LAB — PTH, INTACT AND CALCIUM: Calcium, Total (PTH): 8.9 mg/dL (ref 8.4–10.5)

## 2012-05-19 LAB — VITAMIN D 25 HYDROXY (VIT D DEFICIENCY, FRACTURES): Vit D, 25-Hydroxy: 49 ng/mL (ref 30–89)

## 2012-05-19 MED ORDER — HYDROCODONE-ACETAMINOPHEN 5-325 MG PO TABS: 1.0000 | ORAL_TABLET | Freq: Four times a day (QID) | ORAL | Status: AC | PRN

## 2012-05-19 MED ORDER — METOPROLOL TARTRATE 1 MG/ML IV SOLN
2.5000 mg | Freq: Once | INTRAVENOUS | Status: AC
  Administered 2012-05-19: 2.5 mg via INTRAVENOUS
  Filled 2012-05-19: qty 5

## 2012-05-19 MED ORDER — ACETAMINOPHEN 500 MG PO TABS: 500.0000 mg | ORAL_TABLET | Freq: Four times a day (QID) | ORAL | Status: AC | PRN

## 2012-05-19 MED ORDER — CEFAZOLIN SODIUM 1-5 GM-% IV SOLN: 1.0000 g | INTRAVENOUS | Status: DC

## 2012-05-19 MED ORDER — METOPROLOL TARTRATE 1 MG/ML IV SOLN: 2.5000 mg | INTRAVENOUS | Status: DC | PRN

## 2012-05-19 MED ORDER — HYDROCODONE-ACETAMINOPHEN 5-325 MG PO TABS
1.0000 | ORAL_TABLET | Freq: Four times a day (QID) | ORAL | Status: DC | PRN
  Administered 2012-05-19 – 2012-05-21 (×4): 1 via ORAL
  Filled 2012-05-19 (×4): qty 1

## 2012-05-19 MED ORDER — METOPROLOL TARTRATE 12.5 MG HALF TABLET
12.5000 mg | ORAL_TABLET | Freq: Two times a day (BID) | ORAL | Status: DC
  Administered 2012-05-20 – 2012-05-21 (×4): 12.5 mg via ORAL
  Filled 2012-05-19 (×5): qty 1

## 2012-05-19 MED ORDER — LACTATED RINGERS IV SOLN: INTRAVENOUS | Status: DC

## 2012-05-19 NOTE — Progress Notes (Signed)
I have seen and examined the patient. I agree with the findings above. Will see back in the office in 10-14 days for removal of staples.  Budd Palmer, MD 05/19/2012 8:35 AM

## 2012-05-19 NOTE — Evaluation (Signed)
Occupational Therapy Evaluation Patient Details Name: [name] MRN: [id number] DOB: [birth year] Today's Date: 05/19/2012 Time: 1024-1050 OT Time Calculation (min): 26 min  OT Assessment / Plan / Recommendation Clinical Impression  This 78 year old female sustained a fall with resultant R intertrochanteric hip fx which was surgically managed with IM Nail. She is WBAT and requires A x 2 for all mobilty and LB ADLS.  She will benefit from skilled OT in acute prior to [facility] for rehab.      OT Assessment  Patient needs continued OT Services    Follow Up Recommendations  Skilled nursing facility    Barriers to Discharge      Equipment Recommendations  Defer to next venue    Recommendations for Other Services    Frequency  Min 1X/week    Precautions / Restrictions Precautions Precautions: Fall Restrictions Weight Bearing Restrictions: No RLE Weight Bearing: Weight bearing as tolerated   Pertinent Vitals/Pain Painful RLE when standing--not rated; premedicated.  BP sitting 107/67; HR 120; sats 98% on 2 liters    ADL  Eating/Feeding: Simulated;Independent Where Assessed - Eating/Feeding: Chair Grooming: Performed;Brushing hair;Set up Where Assessed - Grooming: Supported sitting Upper Body Bathing: Simulated;Supervision/safety Where Assessed - Upper Body Bathing: Supported sitting Lower Body Bathing: Simulated;+2 Total assistance Lower Body Bathing: Patient Percentage: 10% Where Assessed - Lower Body Bathing: Supported sit to stand Upper Body Dressing: Performed;Maximal assistance Where Assessed - Upper Body Dressing: Unsupported sitting Lower Body Dressing: Simulated;+2 Total assistance Lower Body Dressing: Patient Percentage: 0% Toilet Transfer: Simulated;+2 Total assistance Toilet Transfer: Patient Percentage: 30% Toilet Transfer Method: Stand pivot Acupuncturist:  (bed to chair:  attempted to sit prematurely) Toileting - Architect and Hygiene:  Simulated;+2 Total assistance Toileting - Architect and Hygiene: Patient Percentage: 0% Where Assessed - Toileting Clothing Manipulation and Hygiene: Sit to stand from 3-in-1 or toilet Equipment Used: Gait belt;Rolling walker Transfers/Ambulation Related to ADLs: pt lost balance sitting EOB unsupported.  Stood, pt 30%; SPT to Left--attempted to sit prematurely    OT Diagnosis: Generalized weakness  OT Problem List: Decreased strength;Decreased activity tolerance;Impaired balance (sitting and/or standing);Decreased cognition;Decreased safety awareness;Decreased knowledge of use of DME or AE;Pain OT Treatment Interventions: Self-care/ADL training;Balance training;Patient/family education;Cognitive remediation/compensation;Therapeutic activities;DME and/or AE instruction   OT Goals Acute Rehab OT Goals OT Goal Formulation: With patient Time For Goal Achievement: 06/02/12 Potential to Achieve Goals: Good ADL Goals Pt Will Perform Grooming: Unsupported;Sitting, edge of bed;with min assist (min guard) ADL Goal: Grooming - Progress: Goal set today Pt Will Perform Upper Body Bathing: with min assist;Unsupported;Sitting, edge of bed (min guard) ADL Goal: Upper Body Bathing - Progress: Goal set today Pt Will Transfer to Toilet: with 2+ total assist;3-in-1;Stand pivot transfer (Pt 50%) ADL Goal: Toilet Transfer - Progress: Goal set today Miscellaneous OT Goals Miscellaneous OT Goal #1: Pt will maintain static standing for ADLs x 2 minutes with min A OT Goal: Miscellaneous Goal #1 - Progress: Goal set today  Visit Information  Last OT Received On: 05/19/12 Assistance Needed: +2 PT/OT Co-Evaluation/Treatment: Yes    Subjective Data  Subjective: "I don't know why I'm here"  "I have to pee" Patient Stated Goal: home   Prior Functioning  Home Living Lives With: Alone Available Help at Discharge: Skilled Nursing Facility Type of Home: House Home Adaptive Equipment: Walker -  rolling Additional Comments: Unable to fully obtain history due to decreased cognition and no family present at time of eval.  Per notes and pt, pt was  living at home alone with a caretaker coming during the day and son lives next door.   Prior Function Comments: PLOF unknown--pt states she lived alone with family nearby Communication Communication: HOH Dominant Hand: Right    Cognition  Overall Cognitive Status: No family/caregiver present to determine baseline cognitive functioning Arousal/Alertness: Awake/alert Orientation Level: Disoriented to;Place;Time;Situation Behavior During Session: [facility] for tasks performed    Extremity/Trunk Assessment Right Upper Extremity Assessment RUE ROM/Strength/Tone: Within functional levels (strength grossly 4-/5 bil) Left Upper Extremity Assessment LUE ROM/Strength/Tone: Within functional levels Right Lower Extremity Assessment RLE ROM/Strength/Tone: Unable to fully assess;Due to pain;Due to impaired cognition Left Lower Extremity Assessment LLE ROM/Strength/Tone: WFL for tasks assessed LLE Coordination: WFL - gross motor Trunk Assessment Trunk Assessment: Kyphotic   Mobility Bed Mobility Bed Mobility: Supine to Sit;Sitting - Scoot to Edge of Bed Supine to Sit: 1: +2 Total assist Supine to Sit: Patient Percentage: 10% Sitting - Scoot to Edge of Bed: 1: +2 Total assist Sitting - Scoot to Edge of Bed: Patient Percentage: 10% Details for Bed Mobility Assistance: Pt requires assist for B LE off of bed and for trunk to get into sitting.  Provided cues for UE placement for pt to self assist, however pt unable to follow commands and had increased pain with movement.  Transfers Sit to Stand: 1: +2 Total assist;From elevated surface;With upper extremity assist;From bed Sit to Stand: Patient Percentage: 30% Stand to Sit: 1: +2 Total assist;With upper extremity assist;To chair/3-in-1 Stand to Sit: Patient Percentage: 30% Details for Transfer Assistance:  Provided assist for standing from bed with verbal and manual cuing to maintain upright posture/glute activation.  Also provided assist and cues for sequencing when transferring from bed to chair.  Had to transition to squat pivot due to pt attempting to sit before at chair.     Exercise    Balance Balance Balance Assessed: Yes Static Sitting Balance Static Sitting - Balance Support: Bilateral upper extremity supported;Feet supported Static Sitting - Level of Assistance: 5: Stand by assistance;4: Min assist Static Sitting - Comment/# of Minutes: Pt requires intermittnet min assist for sitting on EOB x approx 10 mins with continuous cuing for hand placement on bed to assist in supporting self.   End of Session OT - End of Session Activity Tolerance: Patient limited by pain Patient left: in chair;with call bell/phone within reach Nurse Communication: Need for lift equipment   Meshia Rau 05/19/2012, 11:36 AM Marica Otter, OTR/L [phone] 05/19/2012

## 2012-05-19 NOTE — Progress Notes (Signed)
Subjective: No significant pain (except with movements); also pale in appearance; no CP, no SOB and no fever.   Objective: Vital signs in last 24 hours: Temp:  [97.5 F (36.4 C)-99 F (37.2 C)] 99 F (37.2 C) (06/24 0606) Pulse Rate:  [78-99] 98  (06/24 0606) Resp:  [16-18] 18  (06/24 0606) BP: (76-106)/(40-68) 106/68 mmHg (06/24 0606) SpO2:  [91 %-98 %] 97 % (06/24 0606) Weight change:  Last BM Date: 05/17/12  Intake/Output from previous day: 06/23 0701 - 06/24 0700 In: 2280.8 [P.O.:300; I.V.:1730.8; IV Piggyback:250] Out: 750 [Urine:750]     Physical Exam: General: Alert, awake, oriented x2, in no acute distress. HEENT: No bruits, no goiter. Heart: Regular rate and rhythm, without murmurs, rubs, gallops. Lungs: Clear to auscultation bilaterally. Abdomen: Soft, nontender, nondistended, positive bowel sounds. Extremities: No clubbing, cyanosis or edema; denies any pain; dressings on lateral aspect of right hip with minimal snaguinous discharge. Good pedal pulses appreciated bilaterally. Neuro: Grossly intact, nonfocal.   Lab Results: Basic Metabolic Panel:  Basename 05/19/12 0416 05/18/12 0655 05/17/12 2348  NA 139 138 --  K 4.0 4.8 --  CL 107 103 --  CO2 27 30 --  GLUCOSE 92 113* --  BUN 14 15 --  CREATININE 0.94 0.87 --  CALCIUM 8.3* 9.0 --  MG -- 1.9 2.0  PHOS -- -- 3.9   Liver Function Tests:  Basename 05/18/12 0655 05/17/12 1354  AST 21 22  ALT 10 13  ALKPHOS 54 73  BILITOT 0.8 0.6  PROT 5.3* 6.8  ALBUMIN 3.0* 3.9   CBC:  Basename 05/19/12 0416 05/18/12 2235 05/18/12 1835 05/17/12 1354  WBC 7.0 -- 7.6 --  NEUTROABS -- -- -- 6.2  HGB 10.2* 10.0* -- --  HCT 31.2* 30.6* -- --  MCV 92.3 -- 91.3 --  PLT 52* -- 54* --   Cardiac Enzymes:  Basename 05/18/12 1519 05/18/12 0655 05/17/12 2348  CKTOTAL 141 175 136  CKMB 3.7 5.7* 4.6*  CKMBINDEX -- -- --  TROPONINI <0.30 <0.30 <0.30   Coagulation:  Basename 05/17/12 1354  LABPROT 15.3*  INR 1.18    Urinalysis:  Basename 05/17/12 1439  COLORURINE YELLOW  LABSPEC 1.016  PHURINE 7.5  GLUCOSEU NEGATIVE  HGBUR NEGATIVE  BILIRUBINUR NEGATIVE  KETONESUR NEGATIVE  PROTEINUR NEGATIVE  UROBILINOGEN 0.2  NITRITE NEGATIVE  LEUKOCYTESUR NEGATIVE    Studies/Results: Dg Hip Complete Right  05/17/2012  *RADIOLOGY REPORT*  Clinical Data: Fall with right hip pain and deformity.  RIGHT HIP - COMPLETE 2+ VIEW  Comparison: None  Findings: A mildly comminuted intertrochanteric fracture is identified with varus angulation.  The lesser trochanter fragment is displaced by 5 mm. There is no evidence of subluxation or dislocation. Moderate to severe degenerative changes in both hips are present. An intramedullary rod and screw within the proximal left femur is identified.  IMPRESSION: Intertrochanteric right femur fracture with varus angulation.  Original Report Authenticated By: Rosendo Gros, M.D.   Dg Femur Right  05/17/2012  *RADIOLOGY REPORT*  Clinical Data: Right hip fracture.  RIGHT FEMUR - 2 VIEW  Comparison: 05/17/2012  Findings: Four intraoperative spot images demonstrate internal fixation of the right intertrochanteric fracture.  Near anatomic alignment.  No hardware complicating feature.  IMPRESSION: Internal fixation of the right intertrochanteric fracture with near anatomic alignment.  Original Report Authenticated By: Cyndie Chime, M.D.   Dg Pelvis Portable  05/17/2012  *RADIOLOGY REPORT*  Clinical Data: Postop right hip ORIF.  PORTABLE PELVIS  Comparison: 05/17/2012  Findings: Changes of remote left hip ORIF.  New right hip ORIF with anatomic alignment.  Degenerative changes in the hips.  No hardware complicating feature.  IMPRESSION: New changes of right hip ORIF.  Near anatomic alignment.  Original Report Authenticated By: Cyndie Chime, M.D.   Dg Hip Portable 1 View Right  05/18/2012  *RADIOLOGY REPORT*  Clinical Data: ORIF of intertrochanteric right femoral neck fracture yesterday.   PORTABLE RIGHT HIP - 1 VIEW 05/18/2012 0945 hours:  Comparison: Right hip x-rays yesterday, preoperative and postoperative.  Findings: Cross-table lateral image of the right hip read in conjunction with the AP examination yesterday demonstrates anatomic alignment post ORIF of the intertrochanteric right femoral neck fracture.  Intramedullary nail and compression screw have been placed.  Severe joint space narrowing in the right hip again noted.  IMPRESSION: Anatomic alignment post ORIF of the intertrochanteric right femoral neck fracture.  Osteoarthritis in the right hip.  Original Report Authenticated By: Arnell Sieving, M.D.   Dg Hip Portable 1 View Right  05/17/2012  *RADIOLOGY REPORT*  Clinical Data: Postop right hip.  PORTABLE RIGHT HIP - 1 VIEW  Comparison: 05/17/2012  Findings: Changes of right hip ORIF.  Normal alignment.  No hardware or bony complicating feature.  Advanced degenerative changes in the right hip and knee.  IMPRESSION: ORIF right intertrochanteric fracture with anatomic alignment.  Original Report Authenticated By: Cyndie Chime, M.D.   Dg Knee Right Port  05/17/2012  *RADIOLOGY REPORT*  Clinical Data: Fall  PORTABLE RIGHT KNEE - 1-2 VIEW  Comparison: None.  Findings: Advanced degenerative change in  all three compartments of the knee with cartilage loss, subchondral  irregularity, and spurring.  There is chondrocalcinosis medially.  Loose bodies are present in the knee joint.  There is generalized osteopenia.  Negative for fracture.  IMPRESSION: Advanced degenerative change without fracture.  Original Report Authenticated By: Camelia Phenes, M.D.   Dg C-arm 61-120 Min-no Report  05/17/2012  CLINICAL DATA: right hip fracture   C-ARM 61-120 MINUTES  Fluoroscopy was utilized by the requesting physician.  No radiographic  interpretation.      Medications: Scheduled Meds:    . acetaminophen  1,000 mg Intravenous Q6H  . antiseptic oral rinse  15 mL Mouth Rinse q12n4p  .  calcium-vitamin D  1 tablet Oral Daily  .  ceFAZolin (ANCEF) IV  1 g Intravenous Q8H  . chlorhexidine  15 mL Mouth Rinse BID  . digoxin  125 mcg Oral Daily  . docusate sodium  100 mg Oral BID  . ferrous sulfate  325 mg Oral BID PC  . levothyroxine  50 mcg Oral QAC breakfast  . multivitamin with minerals  1 tablet Oral Daily  . sodium chloride  250 mL Intravenous Once  . sodium chloride  500 mL Intravenous Once  . sodium chloride  500 mL Intravenous Once  . Vitamin D (Ergocalciferol)  50,000 Units Oral Q7 days  . DISCONTD: atenolol  25 mg Oral BID  . DISCONTD: sodium chloride  3 mL Intravenous Q12H   Continuous Infusions:    . sodium chloride 100 mL/hr at 05/18/12 2122   PRN Meds:.acetaminophen, acetaminophen, alum & mag hydroxide-simeth, bisacodyl, HYDROcodone-acetaminophen, menthol-cetylpyridinium, metoCLOPramide (REGLAN) injection, metoCLOPramide, ondansetron (ZOFRAN) IV, ondansetron, phenol, polyethylene glycol, DISCONTD:  morphine injection, DISCONTD: oxyCODONE  Assessment/Plan: 1-Hip fracture, intertrochanteric: patient experienced mechanical fall at her yard; no LOC, palpitation, CP or SOB precipitating the fall. S/p surgical repair (day 2), no significant pain and feeling ok. Postoperative recommendations  per Ortho. Especially weight bearing, PT/OT and any short term anticoagulation to prevent DVT/PE. Will need SNF.  2-HTN (hypertension): soft; probably associated with dropped in Hgb/circulating volume and pain meds. Will continue IVF's and monitoring Hgb trend. Will hold atenolol for now.stable will continue current medication regimen.   3-Dementia: severe, but stable. Main problem is short term memory. Patient AAOX2; will continue providing supportive care.   4-Atrial fibrillation: stable; continue digoxin; not a candidate for anticoagulation therapy due to frequent falls. B-blocker on hold for now due to low BP; will resume once BP stabilizes.  5-Osteoporosis: continue vit D  and calcium.   6-Hypothyroidism: TSH WNL and will continue synthroid.   7-Hx of Anemia and with ABLA associated with surgery: continue ferrous sulfate; Hgb 10.2; will follow hgb trend. No need for transfusion at this moment  8-Black stool: most likely due to iron therapy; will guaiac stool x3 and follow Hgb.   9-elevated CKMB: most likely associated with trauma and surgery. No CP and negative troponin; also no changes on telemetry or EKG suspicious for acute ischemia. Will continue monitoring.    LOS: 2 days   Madaline Lefeber [facility] [phone]  05/19/2012, 9:38 AM

## 2012-05-19 NOTE — Progress Notes (Signed)
   CARE MANAGEMENT NOTE 05/19/2012  Patient:  [name], [name]   Account Number:  [id number]  Date Initiated:  05/19/2012  Documentation initiated by:  Jiles Crocker  Subjective/Objective Assessment:   ADMITTED WITH HIP FRACTURE     Action/Plan:   PCP: [name], MD; LIVES AT HOME; SOCIAL WORKER REFERRAL PLACED FOR SNF PLACEMENT   Anticipated DC Date:  05/20/2012   Anticipated DC Plan:  SKILLED NURSING FACILITY  In-house referral  Clinical Social Worker                 Status of service:  In process, will continue to follow Medicare Important Message given?  NA - LOS <3 / Initial given by admissions (If response is "NO", the following Medicare IM given date fields will be blank)  Per UR Regulation:  Reviewed for med. necessity/level of care/duration of stay  Comments:  05/19/2012- B Malaysia Crance RN,BSN, MHA

## 2012-05-19 NOTE — Progress Notes (Signed)
Orthopaedic Trauma Service (OTS)  Subjective: 2 Days Post-Op Procedure(s) (LRB): INTRAMEDULLARY (IM) NAIL FEMORAL (Right) Doing ok this am Confused but in no distress Reports occassional R hip pain but tolerable Events yesterday noted with respect to blood pressures.  Improved this am  Objective: Current Vitals Blood pressure 106/68, pulse 98, temperature 99 F (37.2 C), temperature source Oral, resp. rate 18, height 5\' 2"  (1.575 m), weight 53.524 kg (118 lb), SpO2 97.00%. Vital signs in last 24 hours: Temp:  [97.5 F (36.4 C)-99 F (37.2 C)] 99 F (37.2 C) (06/24 0606) Pulse Rate:  [78-99] 98  (06/24 0606) Resp:  [16-18] 18  (06/24 0606) BP: (76-106)/(40-68) 106/68 mmHg (06/24 0606) SpO2:  [91 %-98 %] 97 % (06/24 0606)  Intake/Output from previous day: 06/23 0701 - 06/24 0700 In: 2280.8 [P.O.:300; I.V.:1730.8; IV Piggyback:250] Out: 750 [Urine:750] Intake/Output      06/23 0701 - 06/24 0700 06/24 0701 - 06/25 0700   P.O. 300    I.V. (mL/kg) 1730.8 (32.3)    IV Piggyback 250    Total Intake(mL/kg) 2280.8 (42.6)    Urine (mL/kg/hr) 750 (0.6)    Blood     Total Output 750    Net +1530.8            LABS  Basename 05/19/12 0416 05/18/12 2235 05/18/12 1835 05/18/12 1145 05/18/12 0655  HGB 10.2* 10.0* 9.8* 10.4* 11.4*    Basename 05/19/12 0416 05/18/12 2235 05/18/12 1835  WBC 7.0 -- 7.6  RBC 3.38* -- 3.21*  HCT 31.2* 30.6* --  PLT 52* -- 54*    Basename 05/19/12 0416 05/18/12 0655  NA 139 138  K 4.0 4.8  CL 107 103  CO2 27 30  BUN 14 15  CREATININE 0.94 0.87  GLUCOSE 92 113*  CALCIUM 8.3* 9.0    Basename 05/17/12 1354  LABPT --  INR 1.18   Vitamin D panel and intact PTH: pending TSH: 0.388  Physical Exam  ZOX:WRUEA, NAD, watching TV Lungs:clear anteriorly Cardiac:s1 and s2 Abd:+ BS, NT Ext: Right Lower Extremity  Dressings C/D/I  Wounds look good  TED to R leg  Distal motor and sensory functions grossly intact  Ext is warm  + DP  pulse  No DCT  Compartments soft and NT    Assessment/Plan: 2 Days Post-Op Procedure(s) (LRB): INTRAMEDULLARY (IM) NAIL FEMORAL (Right)  77 y/o female s/p fall   1. Fall, mechanical 2. R intertrochanteric femur fracture, low energy  WBAT  ROM as tolerated R leg  Dressing changes as needed  TED hose  SCD's  PT/OT   Will need snf  Family may want to consider ALF or Dementia unit after snf 3. DVT/PE prophylaxis  Platelets are 52,000 today  Continue to hold pharmacologic anticoagulation  Continue to mobilize  TED's and SCD's 4. Hypotension  Improved after boluses and increased IVF rate  H/H still stable  Continue to monitor 5. Pain  D/c'd morphine and oxy ir, possible that these may have contributed to #4  IV tylenol completed  Encourage PO tylenol as first line for pain management, if still uncontrolled low dose norco ordered 6. Osteoporosis  Labs pending  Continue with oscal with D 7. FEN  Diet as tolerated  Fluid management per primary team 8. Medical issues  Per medicine 9. Dispo  Continue with therapies  Ortho issues stable.  Ok for SNF from our standpoint    Mearl Latin, PA-C [facility] [phone] (P) 05/19/2012, 8:05 AM

## 2012-05-19 NOTE — Progress Notes (Signed)
Pt sustaining HR in the 130-140s resting. Pt asymptomatic, resting in bed. VS stable. On call [facility] NP called, order for one time dose of metoprolol IV placed, and p.o. Dose restarted. Will continue to monitor pt closely and keep NP updated with pt status.

## 2012-05-19 NOTE — Evaluation (Signed)
Physical Therapy Evaluation Patient Details Name: [name] MRN: [id number] DOB: 1922-01-15 Today's Date: 05/19/2012 Time: 6295-2841 PT Time Calculation (min): 29 min  PT Assessment / Plan / Recommendation Clinical Impression  Pt presents with intertrochanteric femur fracture s/p IM nailing.  Presents today with decreased strength, ROM, mobility and increased pain.  Requires +2 assist for bed mobility and transfer from bed to chair with pt sitting impulsively.  Pt will benefit from skilled PT to address deficits.  PT recommends SNF for follow up therapy at D/C to address deficits.     PT Assessment  Patient needs continued PT services    Follow Up Recommendations  Skilled nursing facility;Supervision/Assistance - 24 hour    Barriers to Discharge Decreased caregiver support      lEquipment Recommendations  Defer to next venue    Recommendations for Other Services     Frequency Min 3X/week    Precautions / Restrictions Precautions Precautions: Fall Restrictions Weight Bearing Restrictions: No RLE Weight Bearing: Weight bearing as tolerated   Pertinent Vitals/Pain 5/10 per [facility]      Mobility  Bed Mobility Bed Mobility: Supine to Sit;Sitting - Scoot to Edge of Bed Supine to Sit: 1: +2 Total assist Supine to Sit: Patient Percentage: 10% Sitting - Scoot to Edge of Bed: 1: +2 Total assist Sitting - Scoot to Edge of Bed: Patient Percentage: 10% Details for Bed Mobility Assistance: Pt requires assist for B LE off of bed and for trunk to get into sitting.  Provided cues for UE placement for pt to self assist, however pt unable to follow commands and had increased pain with movement.  Transfers Transfers: Sit to Stand;Stand to Sit;Stand Pivot Transfers Sit to Stand: 1: +2 Total assist;From elevated surface;With upper extremity assist;From bed Sit to Stand: Patient Percentage: 30% Stand to Sit: 1: +2 Total assist;With upper extremity assist;To chair/3-in-1 Stand to Sit:  Patient Percentage: 30% Stand Pivot Transfers: 1: +2 Total assist Stand Pivot Transfers: Patient Percentage: 30% Details for Transfer Assistance: Provided assist for standing from bed with verbal and manual cuing to maintain upright posture/glute activation.  Also provided assist and cues for sequencing when transferring from bed to chair.  Had to transition to squat pivot due to pt attempting to sit before at chair.   Ambulation/Gait Ambulation/Gait Assistance: Not tested (comment) Stairs: No Wheelchair Mobility Wheelchair Mobility: No    Exercises     PT Diagnosis: Difficulty walking;Abnormality of gait;Generalized weakness;Acute pain  PT Problem List: Decreased strength;Decreased range of motion;Decreased activity tolerance;Decreased balance;Decreased mobility;Decreased safety awareness;Decreased knowledge of use of DME;Decreased knowledge of precautions;Pain;Decreased cognition PT Treatment Interventions: DME instruction;Gait training;Functional mobility training;Therapeutic activities;Therapeutic exercise;Balance training;Patient/family education   PT Goals Acute Rehab PT Goals PT Goal Formulation: With patient Time For Goal Achievement: 06/02/12 Potential to Achieve Goals: Fair Pt will go Supine/Side to Sit: with min assist PT Goal: Supine/Side to Sit - Progress: Goal set today Pt will go Sit to Supine/Side: with min assist PT Goal: Sit to Supine/Side - Progress: Goal set today Pt will go Sit to Stand: with min assist PT Goal: Sit to Stand - Progress: Goal set today Pt will go Stand to Sit: with min assist PT Goal: Stand to Sit - Progress: Goal set today Pt will Transfer Bed to Chair/Chair to Bed: with mod assist PT Transfer Goal: Bed to Chair/Chair to Bed - Progress: Goal set today Pt will Ambulate: 1 - 15 feet;with mod assist;with least restrictive assistive device PT Goal: Ambulate - Progress: Goal set  today  Visit Information  Last PT Received On: 05/19/12 Assistance  Needed: +2 PT/OT Co-Evaluation/Treatment: Yes    Subjective Data  Subjective: I'm still not really sure why I'm here   Prior Functioning  Home Living Lives With: Alone Available Help at Discharge: Skilled Nursing Facility Type of Home: House Home Adaptive Equipment: Walker - rolling Additional Comments: Unable to fully obtain history due to decreased cognition and no family present at time of eval.  Per notes and pt, pt was living at home alone with a caretaker coming during the day and son lives next door.   Prior Function Comments: unable to fully obtaol PLOF due to decreased cognition, no family present.  Communication Communication: HOH    Cognition  Overall Cognitive Status: No family/caregiver present to determine baseline cognitive functioning Arousal/Alertness: Awake/alert Orientation Level: Disoriented X4;Place;Time;Situation Behavior During Session: WFL for tasks performed    Extremity/Trunk Assessment Right Lower Extremity Assessment RLE ROM/Strength/Tone: Unable to fully assess;Due to pain;Due to impaired cognition Left Lower Extremity Assessment LLE ROM/Strength/Tone: WFL for tasks assessed LLE Coordination: WFL - gross motor Trunk Assessment Trunk Assessment: Kyphotic   Balance Balance Balance Assessed: Yes Static Sitting Balance Static Sitting - Balance Support: Bilateral upper extremity supported;Feet supported Static Sitting - Level of Assistance: 5: Stand by assistance;4: Min assist Static Sitting - Comment/# of Minutes: Pt requires intermittnet min assist for sitting on EOB x approx 10 mins with continuous cuing for hand placement on bed to assist in supporting self.   End of Session PT - End of Session Equipment Utilized During Treatment: Gait belt Activity Tolerance: Patient limited by fatigue;Patient limited by pain Patient left: in chair;with call bell/phone within reach Nurse Communication: Mobility status;Need for lift equipment;Patient requests pain  meds   Page, Meribeth Mattes 05/19/2012, 11:07 AM

## 2012-05-20 DIAGNOSIS — R000 Tachycardia, unspecified: Secondary | ICD-10-CM

## 2012-05-20 DIAGNOSIS — S72143A Displaced intertrochanteric fracture of unspecified femur, initial encounter for closed fracture: Secondary | ICD-10-CM

## 2012-05-20 DIAGNOSIS — D62 Acute posthemorrhagic anemia: Secondary | ICD-10-CM

## 2012-05-20 DIAGNOSIS — I10 Essential (primary) hypertension: Secondary | ICD-10-CM

## 2012-05-20 DIAGNOSIS — D696 Thrombocytopenia, unspecified: Secondary | ICD-10-CM

## 2012-05-20 LAB — CBC
HCT: 27.9 % — ABNORMAL LOW (ref 36.0–46.0)
Hemoglobin: 9.3 g/dL — ABNORMAL LOW (ref 12.0–15.0)
MCH: 30.3 pg (ref 26.0–34.0)
MCHC: 33.3 g/dL (ref 30.0–36.0)
RBC: 3.07 MIL/uL — ABNORMAL LOW (ref 3.87–5.11)

## 2012-05-20 LAB — PREPARE RBC (CROSSMATCH)

## 2012-05-20 MED ORDER — METOPROLOL TARTRATE 1 MG/ML IV SOLN
2.5000 mg | Freq: Once | INTRAVENOUS | Status: AC
  Administered 2012-05-20: 2.5 mg via INTRAVENOUS
  Filled 2012-05-20: qty 5

## 2012-05-20 MED ORDER — FUROSEMIDE 10 MG/ML IJ SOLN
20.0000 mg | Freq: Once | INTRAMUSCULAR | Status: AC
  Administered 2012-05-20: 20 mg via INTRAVENOUS
  Filled 2012-05-20: qty 2

## 2012-05-20 NOTE — Progress Notes (Signed)
Subjective: No significant pain (except with movements); remains pale and with increase weakness overall. BP now stable; palpitations and increase HR overnight. No CP, no SOB and no fever.   Objective: Vital signs in last 24 hours: Temp:  [97.5 F (36.4 C)-99.8 F (37.7 C)] 98.2 F (36.8 C) (06/25 1029) Pulse Rate:  [105-150] 105  (06/25 1029) Resp:  [16-20] 18  (06/25 1029) BP: (96-137)/(58-83) 137/83 mmHg (06/25 1029) SpO2:  [87 %-98 %] 97 % (06/25 1029) Weight:  [61.81 kg (136 lb 4.3 oz)] 61.81 kg (136 lb 4.3 oz) (06/25 0732) Weight change:  Last BM Date: 05/17/12  Intake/Output from previous day: 06/24 0701 - 06/25 0700 In: -  Out: 550 [Urine:550] Total I/O In: 120 [P.O.:120] Out: -    Physical Exam: General: Alert, awake, oriented x2, in no acute distress. HEENT: No bruits, no goiter. Heart: Regular rate and rhythm, without murmurs, rubs, gallops. Lungs: Clear to auscultation bilaterally. Abdomen: Soft, nontender, nondistended, positive bowel sounds. Extremities: No clubbing, cyanosis or edema; denies any pain; dressings on lateral aspect of right hip with minimal snaguinous discharge. Good pedal pulses appreciated bilaterally. Neuro: Grossly intact, nonfocal.   Lab Results: Basic Metabolic Panel:  Basename 05/19/12 0416 05/18/12 0655 05/17/12 2348  NA 139 138 --  K 4.0 4.8 --  CL 107 103 --  CO2 27 30 --  GLUCOSE 92 113* --  BUN 14 15 --  CREATININE 0.94 0.87 --  CALCIUM 8.3* 8.99.0 --  MG -- 1.9 2.0  PHOS -- -- 3.9   Liver Function Tests:  [facility] 05/18/12 0655 05/17/12 1354  AST 21 22  ALT 10 13  ALKPHOS 54 73  BILITOT 0.8 0.6  PROT 5.3* 6.8  ALBUMIN 3.0* 3.9   CBC:  Basename 05/20/12 0423 05/19/12 0416 05/17/12 1354  WBC 6.0 7.0 --  NEUTROABS -- -- 6.2  HGB 9.3* 10.2* --  HCT 27.9* 31.2* --  MCV 90.9 92.3 --  PLT 45* 52* --   Cardiac Enzymes:  Basename 05/18/12 1519 05/18/12 0655 05/17/12 2348  CKTOTAL 141 175 136  CKMB 3.7 5.7*  4.6*  CKMBINDEX -- -- --  TROPONINI <0.30 <0.30 <0.30   Coagulation:  Basename 05/17/12 1354  LABPROT 15.3*  INR 1.18   Urinalysis:  Basename 05/17/12 1439  COLORURINE YELLOW  LABSPEC 1.016  PHURINE 7.5  GLUCOSEU NEGATIVE  HGBUR NEGATIVE  BILIRUBINUR NEGATIVE  KETONESUR NEGATIVE  PROTEINUR NEGATIVE  UROBILINOGEN 0.2  NITRITE NEGATIVE  LEUKOCYTESUR NEGATIVE    Studies/Results: No results found.  Medications: Scheduled Meds:    . antiseptic oral rinse  15 mL Mouth Rinse q12n4p  . calcium-vitamin D  1 tablet Oral Daily  .  ceFAZolin (ANCEF) IV  1 g Intravenous 60 min Pre-Op  . chlorhexidine  15 mL Mouth Rinse BID  . digoxin  125 mcg Oral Daily  . docusate sodium  100 mg Oral BID  . ferrous sulfate  325 mg Oral BID PC  . furosemide  20 mg Intravenous Once  . levothyroxine  50 mcg Oral QAC breakfast  . metoprolol  2.5 mg Intravenous Once  . metoprolol  2.5 mg Intravenous Once  . metoprolol tartrate  12.5 mg Oral BID  . multivitamin with minerals  1 tablet Oral Daily  . Vitamin D (Ergocalciferol)  50,000 Units Oral Q7 days   Continuous Infusions:    . sodium chloride 100 mL/hr at 05/19/12 1728  . DISCONTD: lactated ringers     PRN Meds:.acetaminophen, acetaminophen, alum & mag  hydroxide-simeth, bisacodyl, HYDROcodone-acetaminophen, menthol-cetylpyridinium, metoCLOPramide (REGLAN) injection, metoCLOPramide, ondansetron (ZOFRAN) IV, ondansetron, phenol, polyethylene glycol, DISCONTD: metoprolol  Assessment/Plan: 1-Hip fracture, intertrochanteric: patient experienced mechanical fall at her yard; no LOC, palpitation, CP or SOB precipitating the fall. S/p surgical repair (day 3), no significant pain on her hip and feeling ok. Will need SNF for physical rehab once stable.  2-HTN (hypertension): In the 130's now; probably was associated with dropped in Hgb/circulating volume and pain meds. Will change IVF's to [facility]; hgb has drop approx 6 grams since admission. Will  transfuse 1 unit. Will resume b-blocker.   3-ABLA: will transfuse 1 unit of PRBC's and follow CBC in am. Patient with symptoms associated with anemia. Continue ferrous sulfate.  4-Dementia: severe, but stable. Main problem is short term memory. Patient AAOX2; will continue providing supportive care.   5--Atrial fibrillation: stable; continue digoxin; not a candidate for anticoagulation therapy due to frequent falls. B-blocker resume now that BP stable; will adjust doses as needed.  6-Osteoporosis: continue vit D and calcium.   7-Hypothyroidism: TSH WNL; will continue synthroid.   8-Black stool: most likely due to iron therapy; no signs of active intestinal bleeding.  9-elevated CKMB: most likely associated with trauma and surgery. No CP and negative troponin; also no changes on telemetry or EKG suspicious for acute ischemia. Will monitor.     LOS: 3 days   Conna Terada [facility] [phone]  05/20/2012, 12:43 PM

## 2012-05-20 NOTE — Clinical Social Work Psychosocial (Unsigned)
     Clinical Social Work Department BRIEF PSYCHOSOCIAL ASSESSMENT 05/20/2012  Patient:  [name], [name]     Account Number:  [id number]     Admit date:  05/17/2012  Clinical Social Worker:  Hattie Perch  Date/Time:  05/20/2012 12:00 M  Referred by:  Physician  Date Referred:  05/20/2012 Referred for  SNF Placement   Other Referral:   Interview type:  Family Other interview type:    PSYCHOSOCIAL DATA Living Status:  ALONE Admitted from facility:   Level of care:   Primary support name:  william Meader Primary support relationship to patient:  CHILD, ADULT Degree of support available:   good    CURRENT CONCERNS Current Concerns  Post-Acute Placement   Other Concerns:    SOCIAL WORK ASSESSMENT / PLAN patient is confused. patient will need snf placement. patient has previously been to [facility]. patients son would like patient to return there.   Assessment/plan status:   Other assessment/ plan:   Information/referral to community resources:    PATIENTS/FAMILYS RESPONSE TO PLAN OF CARE: patients son is agreeable to patient going to [facility] upon discharge.

## 2012-05-20 NOTE — Clinical Social Work Placement (Unsigned)
     Clinical Social Work Department CLINICAL SOCIAL WORK PLACEMENT NOTE 05/20/2012  Patient:  [name], [name]  Account Number:  [id number] Admit date:  05/17/2012  Clinical Social Worker:  Becky Sax, LCSW  Date/time:  05/20/2012 12:00 M  Clinical Social Work is seeking post-discharge placement for this patient at the following level of care:   SKILLED NURSING   (*CSW will update this form in Epic as items are completed)   05/20/2012  Patient/family provided with [facility] Department of Clinical Social Works list of facilities offering this level of care within the geographic area requested by the patient (or if unable, by the patients family).  05/20/2012  Patient/family informed of their freedom to choose among providers that offer the needed level of care, that participate in Medicare, Medicaid or managed care program needed by the patient, have an available bed and are willing to accept the patient.  05/20/2012  Patient/family informed of [facility] ownership interest in [facility], as well as of the fact that they are under no obligation to receive care at this facility.  PASARR submitted to EDS on 05/20/2012 PASARR number received from [employer] on 05/20/2012  FL2 transmitted to all facilities in geographic area requested by pt/family on  05/20/2012 FL2 transmitted to all facilities within larger geographic area on   Patient informed that his/her managed care company has contracts with or will negotiate with  certain facilities, including the following:     Patient/family informed of bed offers received:  05/20/2012 Patient chooses bed at [facility], [place] Physician recommends and patient chooses bed at    [facility] [place] on   Patient to be transferred to facility by   The following physician request were entered in Epic:   Additional Comments:

## 2012-05-20 NOTE — Progress Notes (Signed)
Occupational Therapy Treatment Patient Details Name: [name] MRN: [id number] DOB: [birth year] Today's Date: 05/20/2012 Time: 4098-1191 OT Time Calculation (min): 34 min  OT Assessment / Plan / Recommendation Comments on Treatment Session Pt needed more help today than at eval.  Will monitor and update goals on next visit if needed    Follow Up Recommendations  Skilled nursing facility    Barriers to Discharge       Equipment Recommendations  Defer to next venue    Recommendations for Other Services    Frequency Min 1X/week   Plan      Precautions / Restrictions Precautions Precautions: Fall Restrictions RLE Weight Bearing: Weight bearing as tolerated   Pertinent Vitals/Pain 0 pain at rest; 8 with movement.  RN brought pain meds    ADL  Grooming: Performed;Teeth care;Brushing hair (full set up andmostly  min A for balance at EOB) Where Assessed - Grooming: Supported sitting Toilet Transfer: Simulated;+2 Total assistance Toilet Transfer: Patient Percentage: 10% Statistician Method: Stand pivot Equipment Used: Gait belt;Rolling walker Transfers/Ambulation Related to ADLs: continues to need min to mod A for unsupported sitting balance:  pt needs less assist when engaged in ADL activity.  Needed more A today with mobility    OT Diagnosis:    OT Problem List:   OT Treatment Interventions:     OT Goals Acute Rehab OT Goals Time For Goal Achievement: 06/02/12 ADL Goals Pt Will Perform Grooming: Unsupported;Sitting, edge of bed;with min assist ADL Goal: Grooming - Progress: Progressing toward goals Pt Will Transfer to Toilet: with 2+ total assist;3-in-1;Stand pivot transfer ADL Goal: Toilet Transfer - Progress: Not progressing (needed increased help today) Miscellaneous OT Goals Miscellaneous OT Goal #1: Pt will maintain static standing for ADLs x 2 minutes with min A OT Goal: Miscellaneous Goal #1 - Progress: Not progressing (needed increased help today)  Visit  Information  Last OT Received On: 05/20/12 Assistance Needed: +2 PT/OT Co-Evaluation/Treatment: Yes    Subjective Data      Prior Functioning       Cognition  Arousal/Alertness: Awake/alert Orientation Level: Place;Time;Situation;Disoriented to Behavior During Session: [facility] for tasks performed    Mobility Bed Mobility Bed Mobility: Supine to Sit;Sitting - Scoot to Edge of Bed Supine to Sit: 1: +2 Total assist Supine to Sit: Patient Percentage: 10% Transfers Sit to Stand: 1: +2 Total assist;From elevated surface;With upper extremity assist;From bed Sit to Stand: Patient Percentage: 20% Details for Transfer Assistance: max cues and pt needed help at hips to complete turn   Exercises    Balance Static Sitting Balance Static Sitting - Balance Support: Bilateral upper extremity supported;Feet supported Static Sitting - Level of Assistance: 4: Min assist;3: Mod assist (variable)  End of Session OT - End of Session Activity Tolerance: Patient limited by fatigue;Patient limited by pain Patient left: in chair;with call bell/phone within reach Nurse Communication: Need for lift equipment   [name] 05/20/2012, 2:44 PM Marica Otter, OTR/L [phone] 05/20/2012

## 2012-05-20 NOTE — Progress Notes (Signed)
Physical Therapy Treatment Patient Details Name: [name] MRN: [id number] DOB: Jan 18, 1922 Today's Date: 05/20/2012 Time: 7829-5621 PT Time Calculation (min): 32 min  PT Assessment / Plan / Recommendation Comments on Treatment Session  Pt continues to have increased pain and inability to WB through RLE, therefore requiring +2 assist for all mobility.     Follow Up Recommendations  Skilled nursing facility;Supervision/Assistance - 24 hour    Barriers to Discharge        Equipment Recommendations  Defer to next venue    Recommendations for Other Services    Frequency Min 3X/week   Plan Discharge plan remains appropriate    Precautions / Restrictions Precautions Precautions: Fall Restrictions Weight Bearing Restrictions: No RLE Weight Bearing: Weight bearing as tolerated   Pertinent Vitals/Pain 8/10    Mobility  Bed Mobility Bed Mobility: Supine to Sit;Sitting - Scoot to Edge of Bed Supine to Sit: 1: +2 Total assist Supine to Sit: Patient Percentage: 10% Sitting - Scoot to Edge of Bed: 1: +2 Total assist Sitting - Scoot to Edge of Bed: Patient Percentage: 10% Details for Bed Mobility Assistance: Pt requires assist for B LE off of bed and for trunk to get into sitting. Provided cues for UE placement for pt to self assist, however pt unable to follow commands and had increased pain with movement.  Transfers Transfers: Sit to Stand;Stand to Sit;Squat Pivot Transfers Sit to Stand: 1: +2 Total assist;From elevated surface;With upper extremity assist;From bed Sit to Stand: Patient Percentage: 20% Stand to Sit: 1: +2 Total assist;With upper extremity assist;To chair/3-in-1 Stand to Sit: Patient Percentage: 20% Squat Pivot Transfers: 1: +2 Total assist Squat Pivot Transfers: Patient Percentage: 20% Details for Transfer Assistance: Continue to provide max cuing for sequencing/technique for sitting/standing and transfer.  Again, pt unable to maintain upright posture, therefore  squat pivot performed to assist pt to chair.   Ambulation/Gait Ambulation/Gait Assistance: Not tested (comment)    Exercises     PT Diagnosis:    PT Problem List:   PT Treatment Interventions:     PT Goals Acute Rehab PT Goals PT Goal Formulation: With patient Time For Goal Achievement: 06/02/12 Potential to Achieve Goals: Fair Pt will go Supine/Side to Sit: with min assist PT Goal: Supine/Side to Sit - Progress: Not progressing (due to increased pain) Pt will go Sit to Stand: with min assist PT Goal: Sit to Stand - Progress: Not progressing (due to increased pain) Pt will go Stand to Sit: with min assist PT Goal: Stand to Sit - Progress: Not progressing (due to increased pain) Pt will Transfer Bed to Chair/Chair to Bed: with mod assist PT Transfer Goal: Bed to Chair/Chair to Bed - Progress: Not progressing (not progressing)  Visit Information  Last PT Received On: 05/20/12 Assistance Needed: +2    Subjective Data  Subjective: I can tell its there (when asked about pain)   Cognition  Overall Cognitive Status: No family/caregiver present to determine baseline cognitive functioning Arousal/Alertness: Awake/alert Orientation Level: Place;Time;Situation;Disoriented to Behavior During Session: [facility] for tasks performed    Balance  Balance Balance Assessed: Yes Static Sitting Balance Static Sitting - Balance Support: Bilateral upper extremity supported;Feet supported Static Sitting - Level of Assistance: 1: +1 Total assist Static Sitting - Comment/# of Minutes: Initially requires total assist for sitting EOB, however when performing grooming activities transitioned to min assist.    End of Session PT - End of Session Equipment Utilized During Treatment: Gait belt Activity Tolerance: Patient limited by fatigue;Patient  limited by pain Patient left: in chair;with call bell/phone within reach Nurse Communication: Mobility status;Need for lift equipment;Patient requests pain meds      Page, Meribeth Mattes 05/20/2012, 3:39 PM

## 2012-05-20 NOTE — Progress Notes (Signed)
Orthopaedic Trauma Service (OTS)  Subjective: 3 Days Post-Op Procedure(s) (LRB): INTRAMEDULLARY (IM) NAIL FEMORAL (Right)   R hip sore but doing ok No new ortho issues No active bleeding evident  Objective: Current Vitals Blood pressure 95/64, pulse 119, temperature 97.7 F (36.5 C), temperature source Oral, resp. rate 18, height 5\' 2"  (1.575 m), weight 61.81 kg (136 lb 4.3 oz), SpO2 98.00%. Vital signs in last 24 hours: Temp:  [97.5 F (36.4 C)-99.8 F (37.7 C)] 97.7 F (36.5 C) (06/25 1605) Pulse Rate:  [99-150] 119  (06/25 1605) Resp:  [16-20] 18  (06/25 1605) BP: (95-137)/(57-86) 95/64 mmHg (06/25 1605) SpO2:  [87 %-98 %] 98 % (06/25 1417) Weight:  [61.81 kg (136 lb 4.3 oz)] 61.81 kg (136 lb 4.3 oz) (06/25 0732)  Intake/Output from previous day: 06/24 0701 - 06/25 0700 In: -  Out: 550 [Urine:550] Intake/Output      06/24 0701 - 06/25 0700 06/25 0701 - 06/26 0700   P.O.  240   I.V. (mL/kg)  250 (4)   IV Piggyback     Total Intake(mL/kg)  490 (7.9)   Urine (mL/kg/hr) 550 (0.4) 300 (0.5)   Total Output 550 300   Net -550 +190           LABS  Basename 05/20/12 0423 05/19/12 0416 05/18/12 2235 05/18/12 1835 05/18/12 1145  HGB 9.3* 10.2* 10.0* 9.8* 10.4*    Basename 05/20/12 0423 05/19/12 0416  WBC 6.0 7.0  RBC 3.07* 3.38*  HCT 27.9* 31.2*  PLT 45* 52*    Basename 05/19/12 0416 05/18/12 0655  NA 139 138  K 4.0 4.8  CL 107 103  CO2 27 30  BUN 14 15  CREATININE 0.94 0.87  GLUCOSE 92 113*  CALCIUM 8.3* 8.99.0   No results found for this basename: LABPT:2,INR:2 in the last 72 hours  Physical Exam  Gen:NAD, sitting in bedside chair, oriented to person and place (hospital) Lungs:clear Cardiac:s1 and s Abd:+ BS WUJ:WJXBJ Lower Extremity  Dressings look great  No change in exam  Swelling controlled   Ext warm   Imaging No results found.  Assessment/Plan: 3 Days Post-Op Procedure(s) (LRB): INTRAMEDULLARY (IM) NAIL FEMORAL (Right)  78 y/o  female s/p fall   1. Fall, mechanical  2. R intertrochanteric femur fracture, low energy   WBAT   ROM as tolerated R leg   Dressing changes as needed   TED hose   SCD's   PT/OT   Will need snf   Family may want to consider ALF or Dementia unit after snf  3. DVT/PE prophylaxis   Platelets are 45,000 today   Continue to hold pharmacologic anticoagulation   Continue to mobilize   TED's and SCD's  4. Hypotension   Improved  Pt receiving 1 unit of PRBC's today  5. Pain   Pain well controlled  Continue with current regimen 6. Osteoporosis   Labs pending   Continue with oscal with D  7. FEN   Diet as tolerated   Fluid management per primary team  8. Medical issues   Per medicine   Thrombocytopenia   ? Secondary to surgery   Check smear on CBC tomorrow   Continue to hold pharmacologic anticoagulation 9. Dispo   Orthopaedically stable for d/c to snf  Will continue to follow along  Mearl Latin, PA-C [facility] [phone] (P) 05/20/2012, 4:14 PM

## 2012-05-21 DIAGNOSIS — I10 Essential (primary) hypertension: Secondary | ICD-10-CM

## 2012-05-21 DIAGNOSIS — D62 Acute posthemorrhagic anemia: Secondary | ICD-10-CM

## 2012-05-21 DIAGNOSIS — R000 Tachycardia, unspecified: Secondary | ICD-10-CM

## 2012-05-21 DIAGNOSIS — S72143A Displaced intertrochanteric fracture of unspecified femur, initial encounter for closed fracture: Secondary | ICD-10-CM

## 2012-05-21 LAB — CBC
HCT: 32.9 % — ABNORMAL LOW (ref 36.0–46.0)
Hemoglobin: 11.3 g/dL — ABNORMAL LOW (ref 12.0–15.0)
MCHC: 34.3 g/dL (ref 30.0–36.0)
MCV: 89.4 fL (ref 78.0–100.0)
RDW: 13.9 % (ref 11.5–15.5)
WBC: 5.7 10*3/uL (ref 4.0–10.5)

## 2012-05-21 LAB — TYPE AND SCREEN
Antibody Screen: NEGATIVE
Unit division: 0

## 2012-05-21 MED ORDER — METOPROLOL TARTRATE 12.5 MG HALF TABLET: 12.5000 mg | ORAL_TABLET | Freq: Two times a day (BID) | ORAL | Status: DC

## 2012-05-21 MED ORDER — ALUM & MAG HYDROXIDE-SIMETH 200-200-20 MG/5ML PO SUSP: 30.0000 mL | ORAL | Status: AC | PRN

## 2012-05-21 MED ORDER — DSS 100 MG PO CAPS: 100.0000 mg | ORAL_CAPSULE | Freq: Two times a day (BID) | ORAL | Status: AC

## 2012-05-21 MED ORDER — MENTHOL 3 MG MT LOZG: 1.0000 | LOZENGE | OROMUCOSAL | Status: DC | PRN

## 2012-05-21 MED ORDER — POLYETHYLENE GLYCOL 3350 17 G PO PACK: 17.0000 g | PACK | Freq: Every day | ORAL | Status: AC | PRN

## 2012-05-21 MED ORDER — METOCLOPRAMIDE HCL 5 MG PO TABS: 5.0000 mg | ORAL_TABLET | Freq: Three times a day (TID) | ORAL | Status: DC | PRN

## 2012-05-21 NOTE — Discharge Summary (Signed)
Physician Discharge Summary  Patient ID: [name] MRN: [id number] DOB/AGE: 76-Mar-1923 76 y.o.  Admit date: 05/17/2012 Discharge date: 05/21/2012  Primary Care Physician:  [name], MD   Discharge Diagnoses:    Principal Problem:  *Hip fracture, intertrochanteric Active Problems:  HTN (hypertension)  Dementia  Atrial fibrillation  Osteoporosis  Hypothyroidism  Thrombocytopenia   Medication List  As of 05/21/2012  9:26 AM   STOP taking these medications         atenolol 25 MG tablet         TAKE these medications         acetaminophen 500 MG tablet   Commonly known as: TYLENOL   Take 1 tablet (500 mg total) by mouth every 6 (six) hours as needed for pain.      alum & mag hydroxide-simeth 200-200-20 MG/5ML suspension   Commonly known as: MAALOX/MYLANTA   Take 30 mLs by mouth every 4 (four) hours as needed for indigestion.      digoxin 0.125 MG tablet   Commonly known as: LANOXIN   Take 125 mcg by mouth daily.      DSS 100 MG Caps   Take 100 mg by mouth 2 (two) times daily.      ferrous sulfate 325 (65 FE) MG tablet   Take 325 mg by mouth 2 (two) times daily.      furosemide 20 MG tablet   Commonly known as: LASIX   ONE TABLET 5 X WEEKLY OR AS DIRECTED.      HYDROcodone-acetaminophen 5-325 MG per tablet   Commonly known as: NORCO   Take 1 tablet by mouth every 6 (six) hours as needed.      levothyroxine 50 MCG tablet   Commonly known as: SYNTHROID, LEVOTHROID   Take 1 tablet by mouth daily. 6 days a week, not on Sundays      menthol-cetylpyridinium 3 MG lozenge   Commonly known as: CEPACOL   Take 1 lozenge (3 mg total) by mouth as needed (sore throat).      metoCLOPramide 5 MG tablet   Commonly known as: REGLAN   Take 1-2 tablets (5-10 mg total) by mouth every 8 (eight) hours as needed (if ondansetron (ZOFRAN) ineffective.).      metoprolol tartrate 12.5 mg Tabs   Commonly known as: LOPRESSOR   Take 0.5 tablets (12.5 mg total) by mouth 2  (two) times daily.      multivitamin tablet   Take 1 tablet by mouth daily.      OYSTER SHELL CALCIUM 500 + D PO   Take 1 tablet by mouth daily.      polyethylene glycol packet   Commonly known as: MIRALAX / GLYCOLAX   Take 17 g by mouth daily as needed.             Disposition and Follow-up: Pt medically stable and ready for discharge.   Follow-up Information    Follow up with HANDY,MICHAEL H, MD. Schedule an appointment as soon as possible for a visit in 1 week.   Contact information:   [address], [address] [place] [address] [phone]          Consults:  Orthopedics  Physical Exam: General: Alert, awake, oriented x2, in no acute distress.  HEENT: No bruits, no goiter.  Heart: Regular rate and rhythm, without murmurs, rubs, gallops.  Lungs: Clear to auscultation bilaterally.  Abdomen: Soft, nontender, nondistended, positive bowel sounds.  Extremities: No clubbing, cyanosis or edema;  denies any pain; dressings on lateral aspect of right hip with minimal snaguinous discharge. Good pedal pulses appreciated bilaterally.  Neuro: Grossly intact, nonfocal    Significant Diagnostic Studies:  Dg Hip Complete Right  24-May-2012  *RADIOLOGY REPORT*  Clinical Data: Fall with right hip pain and deformity.  RIGHT HIP - COMPLETE 2+ VIEW  Comparison: None  Findings: A mildly comminuted intertrochanteric fracture is identified with varus angulation.  The lesser trochanter fragment is displaced by 5 mm. There is no evidence of subluxation or dislocation. Moderate to severe degenerative changes in both hips are present. An intramedullary rod and screw within the proximal left femur is identified.  IMPRESSION: Intertrochanteric right femur fracture with varus angulation.  Original Report Authenticated By: Rosendo Gros, M.D.   Dg Femur Right  24-May-2012  *RADIOLOGY REPORT*  Clinical Data: Right hip fracture.  RIGHT FEMUR - 2 VIEW  Comparison: 05-24-2012   Findings: Four intraoperative spot images demonstrate internal fixation of the right intertrochanteric fracture.  Near anatomic alignment.  No hardware complicating feature.  IMPRESSION: Internal fixation of the right intertrochanteric fracture with near anatomic alignment.  Original Report Authenticated By: Cyndie Chime, M.D.   Dg Pelvis Portable  05/24/2012  *RADIOLOGY REPORT*  Clinical Data: Postop right hip ORIF.  PORTABLE PELVIS  Comparison: May 24, 2012  Findings: Changes of remote left hip ORIF.  New right hip ORIF with anatomic alignment.  Degenerative changes in the hips.  No hardware complicating feature.  IMPRESSION: New changes of right hip ORIF.  Near anatomic alignment.  Original Report Authenticated By: Cyndie Chime, M.D.   Dg Hip Portable 1 View Right  05/18/2012  *RADIOLOGY REPORT*  Clinical Data: ORIF of intertrochanteric right femoral neck fracture yesterday.  PORTABLE RIGHT HIP - 1 VIEW 05/18/2012 0945 hours:  Comparison: Right hip x-rays yesterday, preoperative and postoperative.  Findings: Cross-table lateral image of the right hip read in conjunction with the AP examination yesterday demonstrates anatomic alignment post ORIF of the intertrochanteric right femoral neck fracture.  Intramedullary nail and compression screw have been placed.  Severe joint space narrowing in the right hip again noted.  IMPRESSION: Anatomic alignment post ORIF of the intertrochanteric right femoral neck fracture.  Osteoarthritis in the right hip.  Original Report Authenticated By: Arnell Sieving, M.D.   Dg Hip Portable 1 View Right  May 24, 2012  *RADIOLOGY REPORT*  Clinical Data: Postop right hip.  PORTABLE RIGHT HIP - 1 VIEW  Comparison: May 24, 2012  Findings: Changes of right hip ORIF.  Normal alignment.  No hardware or bony complicating feature.  Advanced degenerative changes in the right hip and knee.  IMPRESSION: ORIF right intertrochanteric fracture with anatomic alignment.  Original Report  Authenticated By: Cyndie Chime, M.D.   Dg Knee Right Port  2012-05-24  *RADIOLOGY REPORT*  Clinical Data: Fall  PORTABLE RIGHT KNEE - 1-2 VIEW  Comparison: None.  Findings: Advanced degenerative change in  all three compartments of the knee with cartilage loss, subchondral  irregularity, and spurring.  There is chondrocalcinosis medially.  Loose bodies are present in the knee joint.  There is generalized osteopenia.  Negative for fracture.  IMPRESSION: Advanced degenerative change without fracture.  Original Report Authenticated By: Camelia Phenes, M.D.   Dg C-arm 61-120 Min-no Report  May 24, 2012  CLINICAL DATA: right hip fracture   C-ARM 61-120 MINUTES  Fluoroscopy was utilized by the requesting physician.  No radiographic  interpretation.      Labs Reviewed  CBC - Abnormal; Notable for the following:  Platelets 82 (*)     All other components within normal limits  DIFFERENTIAL - Abnormal; Notable for the following:    Neutrophils Relative 83 (*)     Lymphocytes Relative 10 (*)     All other components within normal limits  COMPREHENSIVE METABOLIC PANEL - Abnormal; Notable for the following:    Glucose, Bld 129 (*)     GFR calc non Af Amer 74 (*)     GFR calc Af Amer 86 (*)     All other components within normal limits  URINALYSIS, ROUTINE W REFLEX MICROSCOPIC - Abnormal; Notable for the following:    APPearance CLOUDY (*)     All other components within normal limits  DIGOXIN LEVEL - Abnormal; Notable for the following:    Digoxin Level 0.7 (*)     All other components within normal limits  PROTIME-INR - Abnormal; Notable for the following:    Prothrombin Time 15.3 (*)     All other components within normal limits  CARDIAC PANEL(CRET KIN+CKTOT+MB+TROPI) - Abnormal; Notable for the following:    CK, MB 4.6 (*)     Relative Index 3.4 (*)     All other components within normal limits  CARDIAC PANEL(CRET KIN+CKTOT+MB+TROPI) - Abnormal; Notable for the following:    CK, MB 5.7  (*)     Relative Index 3.3 (*)     All other components within normal limits  CBC - Abnormal; Notable for the following:    RBC 3.80 (*)     Hemoglobin 11.4 (*)     HCT 34.2 (*)     Platelets 66 (*)     All other components within normal limits  COMPREHENSIVE METABOLIC PANEL - Abnormal; Notable for the following:    Glucose, Bld 113 (*)     Total Protein 5.3 (*)     Albumin 3.0 (*)     GFR calc non Af Amer 58 (*)     GFR calc Af Amer 67 (*)     All other components within normal limits  PREALBUMIN - Abnormal; Notable for the following:    Prealbumin 17.1 (*)     All other components within normal limits  CARDIAC PANEL(CRET KIN+CKTOT+MB+TROPI) - Abnormal; Notable for the following:    Relative Index 2.6 (*)     All other components within normal limits  HEMOGLOBIN AND HEMATOCRIT, BLOOD - Abnormal; Notable for the following:    Hemoglobin 10.4 (*)     HCT 31.4 (*)     All other components within normal limits  CBC - Abnormal; Notable for the following:    RBC 3.21 (*)     Hemoglobin 9.8 (*)     HCT 29.3 (*)     Platelets 54 (*)  CONSISTENT WITH PREVIOUS RESULT   All other components within normal limits  CBC - Abnormal; Notable for the following:    RBC 3.38 (*)     Hemoglobin 10.2 (*)     HCT 31.2 (*)     Platelets 52 (*)     All other components within normal limits  BASIC METABOLIC PANEL - Abnormal; Notable for the following:    Calcium 8.3 (*)     GFR calc non Af Amer 53 (*)     GFR calc Af Amer 61 (*)     All other components within normal limits  HEMOGLOBIN AND HEMATOCRIT, BLOOD - Abnormal; Notable for the following:    Hemoglobin 10.0 (*)     HCT 30.6 (*)  All other components within normal limits  CBC - Abnormal; Notable for the following:    RBC 3.07 (*)     Hemoglobin 9.3 (*)     HCT 27.9 (*)     Platelets 45 (*)     All other components within normal limits  CBC - Abnormal; Notable for the following:    RBC 3.68 (*)     Hemoglobin 11.3 (*)     HCT  32.9 (*)     Platelets 49 (*)     All other components within normal limits  TYPE AND SCREEN  ABO/RH  PHOSPHORUS  MAGNESIUM  TSH  MAGNESIUM  PTH, INTACT AND CALCIUM  VITAMIN D 25 HYDROXY  PREPARE RBC (CROSSMATCH)  TECHNOLOGIST SMEAR REVIEW  VITAMIN D 1,25 DIHYDROXY     Procedure(s): INTRAMEDULLARY (IM) NAIL FEMORAL   Dg Hip Complete Right  05/17/2012  *RADIOLOGY REPORT*  Clinical Data: Fall with right hip pain and deformity.  RIGHT HIP - COMPLETE 2+ VIEW  Comparison: None  Findings: A mildly comminuted intertrochanteric fracture is identified with varus angulation.  The lesser trochanter fragment is displaced by 5 mm. There is no evidence of subluxation or dislocation. Moderate to severe degenerative changes in both hips are present. An intramedullary rod and screw within the proximal left femur is identified.  IMPRESSION: Intertrochanteric right femur fracture with varus angulation.  Original Report Authenticated By: Rosendo Gros, M.D.   Dg Femur Right  05/17/2012  *RADIOLOGY REPORT*  Clinical Data: Right hip fracture.  RIGHT FEMUR - 2 VIEW  Comparison: 05/17/2012  Findings: Four intraoperative spot images demonstrate internal fixation of the right intertrochanteric fracture.  Near anatomic alignment.  No hardware complicating feature.  IMPRESSION: Internal fixation of the right intertrochanteric fracture with near anatomic alignment.  Original Report Authenticated By: Cyndie Chime, M.D.   Dg Pelvis Portable  05/17/2012  *RADIOLOGY REPORT*  Clinical Data: Postop right hip ORIF.  PORTABLE PELVIS  Comparison: 05/17/2012  Findings: Changes of remote left hip ORIF.  New right hip ORIF with anatomic alignment.  Degenerative changes in the hips.  No hardware complicating feature.  IMPRESSION: New changes of right hip ORIF.  Near anatomic alignment.  Original Report Authenticated By: Cyndie Chime, M.D.   Dg Hip Portable 1 View Right  05/18/2012  *RADIOLOGY REPORT*  Clinical Data: ORIF of  intertrochanteric right femoral neck fracture yesterday.  PORTABLE RIGHT HIP - 1 VIEW 05/18/2012 0945 hours:  Comparison: Right hip x-rays yesterday, preoperative and postoperative.  Findings: Cross-table lateral image of the right hip read in conjunction with the AP examination yesterday demonstrates anatomic alignment post ORIF of the intertrochanteric right femoral neck fracture.  Intramedullary nail and compression screw have been placed.  Severe joint space narrowing in the right hip again noted.  IMPRESSION: Anatomic alignment post ORIF of the intertrochanteric right femoral neck fracture.  Osteoarthritis in the right hip.  Original Report Authenticated By: Arnell Sieving, M.D.   Dg Hip Portable 1 View Right  05/17/2012  *RADIOLOGY REPORT*  Clinical Data: Postop right hip.  PORTABLE RIGHT HIP - 1 VIEW  Comparison: 05/17/2012  Findings: Changes of right hip ORIF.  Normal alignment.  No hardware or bony complicating feature.  Advanced degenerative changes in the right hip and knee.  IMPRESSION: ORIF right intertrochanteric fracture with anatomic alignment.  Original Report Authenticated By: Cyndie Chime, M.D.   Dg Knee Right Port  05/17/2012  *RADIOLOGY REPORT*  Clinical Data: Fall  PORTABLE RIGHT KNEE -  1-2 VIEW  Comparison: None.  Findings: Advanced degenerative change in  all three compartments of the knee with cartilage loss, subchondral  irregularity, and spurring.  There is chondrocalcinosis medially.  Loose bodies are present in the knee joint.  There is generalized osteopenia.  Negative for fracture.  IMPRESSION: Advanced degenerative change without fracture.  Original Report Authenticated By: Camelia Phenes, M.D.   Dg C-arm 61-120 Min-no Report  05/17/2012  CLINICAL DATA: right hip fracture   C-ARM 61-120 MINUTES  Fluoroscopy was utilized by the requesting physician.  No radiographic  interpretation.         Brief H and P: For complete details please refer to admission H and P, but  in brief  78 y/o female with hx of dementia, HTN, HLD,hypothyroidism and osteoporosis; came to the ED on 05/17/12  after she experienced mechanical fall at home in yard resulting in excruciating right hip pain; pain is worse with movement and hip rotation. She denied any prodromic symptoms that precipitated her fall. specifically denied CP, SOB, lightheadedness, abdominal pain, nausea/vomiting, LOC or syncope.  In the ED she was found to have right intertrochanteric fx with varus angulation; [facility] call to admit and ortho consulted for surgical repair.    Hospital Course:   Principal Problem:  *Hip fracture, intertrochanteric Active Problems:  HTN (hypertension)  Dementia  Atrial fibrillation  Osteoporosis  Hypothyroidism  Thrombocytopenia 1-Hip fracture, intertrochanteric: patient experienced mechanical fall at her yard; no LOC, palpitation, CP or SOB precipitating the fall. Admitted to telemetry. Seen by ortho.  S/p surgical repair on 05/17/12. Tolerated well. Pain well managed. Will be discharged with weight bearing as tolerated. Daily PT. To follow up with Dr handy 2 weeks  2-HTN (hypertension): Mostly controled. One episode of hypotension and tachycardia probably was associated with dropped in Hgb/circulating volume and pain meds. Supported with IV fluids, transfusion on 1 unit PRBC's and adjustment of pain med. Quickly resolved. At discharge SBP range 95 -137. BB resumed at lower dose. Will need monitoring and home dose will likely need to be resumed. Follow up with PCP 1 week.   3-ABLA: related to #1. S/P 1 unit of PRBC's. At discharge hg 11.3. Recommend checking cbc 1 week.  Continue ferrous sulfate.  4-Dementia: severe, but stable. Main problem is short term memory. Patient AAOX2; at baseline at discharge.  5--Atrial fibrillation: stable; continue digoxin; not a candidate for anticoagulation therapy due to frequent falls. Marland Kitchen  6-Osteoporosis: continue vit D and calcium.  7-Hypothyroidism:  TSH WNL; will continue synthroid.  8-Black stool: most likely due to iron therapy; no signs of active intestinal bleeding.  9-elevated CKMB: most likely associated with trauma and surgery. No CP and negative troponin; also no changes on telemetry or EKG suspicious for acute ischemia.       Time spent on Discharge: 40 minutes  Signed: Gwenyth Bender 05/21/2012, 9:26 AM   Patient seen and examined by me.  D/C to SNF today.  To follow up with ortho and continue daily PT.  No issues overnight.  Marlin Canary D.O.

## 2012-05-21 NOTE — Plan of Care (Signed)
Problem: Phase III Progression Outcomes Goal: Voiding independently Outcome: Adequate for Discharge Has not voided past foley d/c

## 2012-05-21 NOTE — Progress Notes (Signed)
Patient cleared for discharge. Packet copied and placed in [place]. ptar called for transportation. CSW met with patient. Patient is agreeable to transfer to [facility]. CSW called patient's son and informed him of d/c. He is also agreeable.  Treyveon Mochizuki C. Angeni Chaudhuri MSW, LCSW [phone]

## 2012-05-21 NOTE — Progress Notes (Signed)
Report called to [facility] and talked to [employer]

## 2012-05-21 NOTE — Progress Notes (Signed)
I have seen and examined the patient. I agree with the findings above.  Budd Palmer, MD 05/21/2012 8:09 AM

## 2012-05-21 NOTE — Progress Notes (Signed)
     [facility] (OTS)  Subjective: 4 Days Post-Op Procedure(s) (LRB): INTRAMEDULLARY (IM) NAIL FEMORAL (Right) Feeling well, "ready to leave"  Objective: Current Vitals Blood pressure 137/75, pulse 105, temperature 98.2 F (36.8 C), temperature source Oral, resp. rate 18, height 5\' 2"  (1.575 m), weight 130 lb 1.1 oz (59 kg), SpO2 99.00%. Vital signs in last 24 hours: Temp:  [97.7 F (36.5 C)-98.4 F (36.9 C)] 98.2 F (36.8 C) (06/26 0517) Pulse Rate:  [80-119] 105  (06/26 0517) Resp:  [16-20] 18  (06/26 0517) BP: (95-137)/(57-86) 137/75 mmHg (06/26 0517) SpO2:  [93 %-99 %] 99 % (06/26 0517) Weight:  [130 lb 1.1 oz (59 kg)] 130 lb 1.1 oz (59 kg) (06/26 0517)  Intake/Output from previous day: 06/25 0701 - 06/26 0700 In: 3689 [P.O.:360; I.V.:3046; Blood:281; IV Piggyback:2] Out: 2375 [Urine:2375]  LABS  Basename 05/21/12 0447 05/20/12 0423 05/19/12 0416 05/18/12 2235 05/18/12 1835  HGB 11.3* 9.3* 10.2* 10.0* 9.8*    Basename 05/21/12 0447 05/20/12 0423  WBC 5.7 6.0  RBC 3.68* 3.07*  HCT 32.9* 27.9*  PLT 49* 45*    Basename 05/19/12 0416  NA 139  K 4.0  CL 107  CO2 27  BUN 14  CREATININE 0.94  GLUCOSE 92  CALCIUM 8.3*   No results found for this basename: LABPT:2,INR:2 in the last 72 hours  Physical Exam  RLE edema well controlled; drsgs with no drainage  Imaging No results found.  Assessment/Plan: 4 Days Post-Op Procedure(s) (LRB): INTRAMEDULLARY (IM) NAIL FEMORAL (Right)  D/c planning WBAT w PT F/u in 10 days  Myrene Galas, MD [facility], [facility] [phone]  05/21/2012, 8:13 AM

## 2012-05-23 LAB — VITAMIN D 1,25 DIHYDROXY
Vitamin D 1, 25 (OH)2 Total: 36 pg/mL (ref 18–72)
Vitamin D2 1, 25 (OH)2: <8 pg/mL

## 2012-07-03 ENCOUNTER — Telehealth: Payer: Self-pay

## 2012-07-03 NOTE — Telephone Encounter (Signed)
Let me know if you need orders on this.  Thanks.

## 2012-07-03 NOTE — Telephone Encounter (Signed)
Donald occupational therapist with [facility] left v/m for FYI; will be sending order for signature for 1 add'l visit for training and use of transfer bench for bathtub.

## 2012-07-03 NOTE — Telephone Encounter (Signed)
[name] with [facility]; [facility] nurse on phone with [name]; pt admitted to hospital. [name] was going to transfer [facility] nurse to me but either nurse hung up or call dropped. [name] did not have any other info, nurses name phone # or what hospital admitted to or diagnosis. [name] will send note if nurse calls back.

## 2012-07-03 NOTE — Telephone Encounter (Signed)
I'll address the hard copy when it arrives.  

## 2012-07-03 NOTE — Telephone Encounter (Signed)
Caller: Suzanne/Other; PCP: [name] Clelia Croft); CB#: [phone]; ; ; Call regarding Home Health;  Rosalita Chessman with [facility] calling to inform Dr. Para March that she was admitted to [facility] for Physical Therapy, Occupational Therapy and Nursing.  Tried to transfer call to office and Home Health nurse was disconnected from call. Spoke with Rena in the office and stated she will make a note and asked if i would make a note in EPIC as well.

## 2012-07-08 DIAGNOSIS — I10 Essential (primary) hypertension: Secondary | ICD-10-CM

## 2012-07-08 DIAGNOSIS — S72009D Fracture of unspecified part of neck of unspecified femur, subsequent encounter for closed fracture with routine healing: Secondary | ICD-10-CM

## 2012-07-08 DIAGNOSIS — Z9181 History of falling: Secondary | ICD-10-CM

## 2012-07-08 DIAGNOSIS — R262 Difficulty in walking, not elsewhere classified: Secondary | ICD-10-CM

## 2012-07-17 ENCOUNTER — Telehealth: Payer: Self-pay

## 2012-07-17 NOTE — Telephone Encounter (Signed)
Peggy advised.

## 2012-07-17 NOTE — Telephone Encounter (Signed)
Pt has dementia;has only seen Dr Para March x 1. Pt's sister, Gigi Gin calling; will be difficult to bring pt to office if needs lab or to be seen(apprx 2 months ago pt fx hip). Peggy said pt is not out of Levothyroxine (Peggy not sure of mcg) but next month will need refills. When does Dr Para March need to see pt again and can refills be given thru Sept without pt coming to office. [facility].

## 2012-07-17 NOTE — Telephone Encounter (Signed)
I can do the refills in meantime.  Have the pharmacy send the needed refills.  If her status improves to the point of being able to come in, then have them notify me.  Thanks.

## 2012-08-08 ENCOUNTER — Telehealth: Payer: Self-pay | Admitting: Family Medicine

## 2012-08-08 NOTE — Telephone Encounter (Signed)
Pt is needing refill on medication and the [facility] is saying that they are waiting on a response from [place] ??

## 2012-08-10 NOTE — Telephone Encounter (Signed)
See what meds needs to be refilled and let me know.

## 2012-08-11 NOTE — Telephone Encounter (Signed)
Phoned the pharmacy.  They had sent the requests to a [phone] number (? [phone]).  Current information was given to [place], the pharmacist and he is going to fax the requests.  Patient advised.

## 2012-08-12 ENCOUNTER — Other Ambulatory Visit: Payer: Self-pay | Admitting: *Deleted

## 2012-08-12 NOTE — Telephone Encounter (Signed)
Patient was last seen in March.  Please advise.

## 2012-08-13 MED ORDER — METOPROLOL TARTRATE 12.5 MG HALF TABLET: 12.5000 mg | ORAL_TABLET | Freq: Two times a day (BID) | ORAL | Status: DC

## 2012-08-13 MED ORDER — LEVOTHYROXINE SODIUM 50 MCG PO TABS: 50.0000 ug | ORAL_TABLET | Freq: Every day | ORAL | Status: DC

## 2012-08-13 NOTE — Telephone Encounter (Signed)
Sent!

## 2012-08-25 ENCOUNTER — Encounter (HOSPITAL_COMMUNITY): Payer: Self-pay

## 2012-08-25 ENCOUNTER — Inpatient Hospital Stay (HOSPITAL_COMMUNITY)
Admission: EM | Admit: 2012-08-25 | Discharge: 2012-08-28 | DRG: 309 | Disposition: A | Discharge status: Skilled Nursing Facility | Payer: Medicare Other | Attending: Internal Medicine | Admitting: Internal Medicine

## 2012-08-25 ENCOUNTER — Emergency Department (HOSPITAL_COMMUNITY): Payer: Medicare Other

## 2012-08-25 ENCOUNTER — Telehealth: Payer: Self-pay | Admitting: Family Medicine

## 2012-08-25 DIAGNOSIS — Z79899 Other long term (current) drug therapy: Secondary | ICD-10-CM

## 2012-08-25 DIAGNOSIS — I498 Other specified cardiac arrhythmias: Principal | ICD-10-CM | POA: Diagnosis present

## 2012-08-25 DIAGNOSIS — M810 Age-related osteoporosis without current pathological fracture: Secondary | ICD-10-CM | POA: Diagnosis present

## 2012-08-25 DIAGNOSIS — Z7901 Long term (current) use of anticoagulants: Secondary | ICD-10-CM

## 2012-08-25 DIAGNOSIS — S72002A Fracture of unspecified part of neck of left femur, initial encounter for closed fracture: Secondary | ICD-10-CM

## 2012-08-25 DIAGNOSIS — S72143A Displaced intertrochanteric fracture of unspecified femur, initial encounter for closed fracture: Secondary | ICD-10-CM

## 2012-08-25 DIAGNOSIS — I10 Essential (primary) hypertension: Secondary | ICD-10-CM | POA: Diagnosis present

## 2012-08-25 DIAGNOSIS — I951 Orthostatic hypotension: Secondary | ICD-10-CM | POA: Diagnosis present

## 2012-08-25 DIAGNOSIS — M129 Arthropathy, unspecified: Secondary | ICD-10-CM | POA: Diagnosis present

## 2012-08-25 DIAGNOSIS — M4800 Spinal stenosis, site unspecified: Secondary | ICD-10-CM

## 2012-08-25 DIAGNOSIS — N390 Urinary tract infection, site not specified: Secondary | ICD-10-CM | POA: Diagnosis present

## 2012-08-25 DIAGNOSIS — I4891 Unspecified atrial fibrillation: Secondary | ICD-10-CM | POA: Diagnosis present

## 2012-08-25 DIAGNOSIS — Z8701 Personal history of pneumonia (recurrent): Secondary | ICD-10-CM

## 2012-08-25 DIAGNOSIS — D696 Thrombocytopenia, unspecified: Secondary | ICD-10-CM | POA: Diagnosis present

## 2012-08-25 DIAGNOSIS — D649 Anemia, unspecified: Secondary | ICD-10-CM

## 2012-08-25 DIAGNOSIS — I252 Old myocardial infarction: Secondary | ICD-10-CM

## 2012-08-25 DIAGNOSIS — E039 Hypothyroidism, unspecified: Secondary | ICD-10-CM

## 2012-08-25 DIAGNOSIS — F0390 Unspecified dementia without behavioral disturbance: Secondary | ICD-10-CM | POA: Diagnosis present

## 2012-08-25 DIAGNOSIS — M6282 Rhabdomyolysis: Secondary | ICD-10-CM

## 2012-08-25 DIAGNOSIS — R55 Syncope and collapse: Secondary | ICD-10-CM | POA: Diagnosis present

## 2012-08-25 DIAGNOSIS — R251 Tremor, unspecified: Secondary | ICD-10-CM

## 2012-08-25 HISTORY — DX: Major depressive disorder, single episode, unspecified: F32.9

## 2012-08-25 HISTORY — DX: Depression, unspecified: F32.A

## 2012-08-25 HISTORY — DX: Pneumonia, unspecified organism: J18.9

## 2012-08-25 LAB — CBC WITH DIFFERENTIAL/PLATELET
Hemoglobin: 13.7 g/dL (ref 12.0–15.0)
Lymphocytes Relative: 28 % (ref 12–46)
Lymphs Abs: 1.3 10*3/uL (ref 0.7–4.0)
Monocytes Relative: 11 % (ref 3–12)
Neutro Abs: 2.9 10*3/uL (ref 1.7–7.7)
Neutrophils Relative %: 61 % (ref 43–77)
RBC: 4.58 MIL/uL (ref 3.87–5.11)

## 2012-08-25 LAB — URINALYSIS, ROUTINE W REFLEX MICROSCOPIC
Bilirubin Urine: NEGATIVE
Ketones, ur: 15 mg/dL — AB
Nitrite: NEGATIVE
pH: 7.5 (ref 5.0–8.0)

## 2012-08-25 LAB — COMPREHENSIVE METABOLIC PANEL
Albumin: 3.5 g/dL (ref 3.5–5.2)
Alkaline Phosphatase: 85 U/L (ref 39–117)
BUN: 17 mg/dL (ref 6–23)
CO2: 33 mEq/L — ABNORMAL HIGH (ref 19–32)
Chloride: 100 mEq/L (ref 96–112)
Glucose, Bld: 93 mg/dL (ref 70–99)
Potassium: 3.9 mEq/L (ref 3.5–5.1)
Total Bilirubin: 0.4 mg/dL (ref 0.3–1.2)

## 2012-08-25 LAB — MAGNESIUM: Magnesium: 2.3 mg/dL (ref 1.5–2.5)

## 2012-08-25 LAB — URINE MICROSCOPIC-ADD ON

## 2012-08-25 LAB — TROPONIN I
Troponin I: <0.3 ng/mL (ref <0.30)
Troponin I: <0.3 ng/mL (ref <0.30)
Troponin I: <0.3 ng/mL (ref <0.30)

## 2012-08-25 LAB — TSH: TSH: 1.052 u[IU]/mL (ref 0.350–4.500)

## 2012-08-25 MED ORDER — METOPROLOL TARTRATE 25 MG PO TABS: 25.0000 mg | ORAL_TABLET | Freq: Two times a day (BID) | ORAL | Status: DC

## 2012-08-25 MED ORDER — SENNA 8.6 MG PO TABS
1.0000 | ORAL_TABLET | Freq: Every evening | ORAL | Status: DC
  Administered 2012-08-25 – 2012-08-27 (×3): 8.6 mg via ORAL
  Filled 2012-08-25 (×4): qty 1

## 2012-08-25 MED ORDER — SODIUM CHLORIDE 0.9 % IV SOLN
INTRAVENOUS | Status: AC
  Administered 2012-08-25: 50 mL/h via INTRAVENOUS

## 2012-08-25 MED ORDER — SODIUM CHLORIDE 0.9 % IJ SOLN
3.0000 mL | Freq: Two times a day (BID) | INTRAMUSCULAR | Status: DC
  Administered 2012-08-26 – 2012-08-28 (×5): 3 mL via INTRAVENOUS

## 2012-08-25 MED ORDER — SODIUM CHLORIDE 0.9 % IV SOLN: INTRAVENOUS | Status: DC

## 2012-08-25 MED ORDER — DEXTROSE 5 % IV SOLN
1.0000 g | INTRAVENOUS | Status: DC
  Administered 2012-08-26 – 2012-08-28 (×3): 1 g via INTRAVENOUS
  Filled 2012-08-25 (×3): qty 10

## 2012-08-25 MED ORDER — SERTRALINE HCL 25 MG PO TABS
25.0000 mg | ORAL_TABLET | Freq: Every day | ORAL | Status: DC
  Administered 2012-08-26 – 2012-08-28 (×3): 25 mg via ORAL
  Filled 2012-08-25 (×3): qty 1

## 2012-08-25 MED ORDER — BUPROPION HCL 100 MG PO TABS
100.0000 mg | ORAL_TABLET | Freq: Every day | ORAL | Status: DC
  Administered 2012-08-26 – 2012-08-28 (×3): 100 mg via ORAL
  Filled 2012-08-25 (×3): qty 1

## 2012-08-25 MED ORDER — ONDANSETRON HCL 4 MG PO TABS: 4.0000 mg | ORAL_TABLET | Freq: Four times a day (QID) | ORAL | Status: DC | PRN

## 2012-08-25 MED ORDER — LEVOTHYROXINE SODIUM 50 MCG PO TABS
50.0000 ug | ORAL_TABLET | Freq: Every day | ORAL | Status: DC
  Administered 2012-08-25 – 2012-08-28 (×4): 50 ug via ORAL
  Filled 2012-08-25 (×4): qty 1

## 2012-08-25 MED ORDER — ONDANSETRON HCL 4 MG/2ML IJ SOLN: 4.0000 mg | Freq: Four times a day (QID) | INTRAMUSCULAR | Status: DC | PRN

## 2012-08-25 MED ORDER — ONDANSETRON HCL 4 MG/2ML IJ SOLN: 4.0000 mg | Freq: Three times a day (TID) | INTRAMUSCULAR | Status: DC | PRN

## 2012-08-25 MED ORDER — FERROUS SULFATE 325 (65 FE) MG PO TABS
325.0000 mg | ORAL_TABLET | Freq: Two times a day (BID) | ORAL | Status: DC
  Administered 2012-08-25 – 2012-08-28 (×6): 325 mg via ORAL
  Filled 2012-08-25 (×7): qty 1

## 2012-08-25 MED ORDER — SODIUM CHLORIDE 0.9 % IV BOLUS (SEPSIS)
500.0000 mL | Freq: Once | INTRAVENOUS | Status: AC
  Administered 2012-08-25: 500 mL via INTRAVENOUS

## 2012-08-25 MED ORDER — ENOXAPARIN SODIUM 30 MG/0.3ML ~~LOC~~ SOLN
30.0000 mg | SUBCUTANEOUS | Status: DC
  Administered 2012-08-25 – 2012-08-27 (×3): 30 mg via SUBCUTANEOUS
  Filled 2012-08-25 (×4): qty 0.3

## 2012-08-25 MED ORDER — DIGOXIN 125 MCG PO TABS
125.0000 ug | ORAL_TABLET | Freq: Every day | ORAL | Status: DC
  Administered 2012-08-26 – 2012-08-28 (×3): 125 ug via ORAL
  Filled 2012-08-25 (×3): qty 1

## 2012-08-25 MED ORDER — SODIUM CHLORIDE 0.9 % IV SOLN: Freq: Once | INTRAVENOUS | Status: DC

## 2012-08-25 MED ORDER — DEXTROSE 5 % IV SOLN
1.0000 g | Freq: Once | INTRAVENOUS | Status: AC
  Administered 2012-08-25: 1 g via INTRAVENOUS
  Filled 2012-08-25: qty 10

## 2012-08-25 MED ORDER — DOCUSATE SODIUM 100 MG PO CAPS
100.0000 mg | ORAL_CAPSULE | Freq: Every evening | ORAL | Status: DC
  Administered 2012-08-25 – 2012-08-27 (×3): 100 mg via ORAL
  Filled 2012-08-25 (×4): qty 1

## 2012-08-25 NOTE — ED Notes (Signed)
Patient has been placed on bed attempting to void.

## 2012-08-25 NOTE — Progress Notes (Signed)
Disposition Note  [name], is a 78 y.o. female,   MRN: [id number]  -  DOB - Mar 28, 1922  Outpatient Primary MD for the patient is [name], MD Corinda [facility] at [facility]  Blood pressure 71/47, pulse 92, temperature 97.7 F (36.5 C), temperature source Oral, resp. rate 14, SpO2 100.00%.  Active Problems:  Syncope and collapse  Orthostatic hypotension  UTI (lower urinary tract infection)  Atrial fibrillation  HTN (hypertension)  Encounter for long-term (current) use of anticoagulants  Dementia  Hypothyroidism  Thrombocytopenia   78 yo female who lives at home with a caretaker.  She is normally in bed.  Today was up sitting on the toilet (not to use the bathroom) waiting to start her bath.  Her caretaker stepped out of the room and found her slumped over and non-responsive when they returned.  She awoke after a few min and was back to base line in 10 min.  In the ED she is found to be profoundly orthostatic 71/41, and to have a UTI.    EKG was Sinus brady, Labs were unremarkable (other than chronic thrombocytopenia).  A CT head was not done yet as it was felt that a cause for her syncope was found.  She was given a dose of Rocephin in the ED for UTI.  I have requested a tele bed.    Algis Downs, PA-C [facility] Pager: [phone]

## 2012-08-25 NOTE — Telephone Encounter (Signed)
Agreed, needs 911.  Thanks for update.

## 2012-08-25 NOTE — H&P (Signed)
[facility] History and Physical  [name]:[id number] DOB: 03-28-1922 DOA: 08/25/2012  Referring physician: ED physician PCP: [name], MD   Chief Complaint: Syncope  HPI:  Pt is 78 yo female who comes in to [facility] after an episode of "passing out" earlier today while she was in the bathroom. Her caregiver found her on the floor unresponsive and pt apparently returned to baseline after 10 minutes. Pt denies any prodromal events, no chest pain or shortness of breath, no similar events in the past, no abdominal or urinary concerns, no fevers, no chills. No other systemic symptoms. She reports she feels at her baseline at the moment.  Assessment and Plan: Active Problems:  Syncope - likely secondary to orthostatics in the setting of bradycardia - vitals signs are currently stable - will admit the pt to telemetry unit for further evaluation and management - will also check 12 lead EKG and orthostatic vitals - will provide supportive care for now ith IVF - hold Metoprolol - check TSH    Atrial fibrillation - currently in sinus rhythm - will continue Digoxin but due to bradycardia will hold Metoprolol - monitor vitals per floor protocol   HTN (hypertension) - hypotensive on admission - will hold Metoprolol and will provide IVF - monitor vitals per floor protocol   Dementia - progressive but appears that pt is at her baseline   UTI (lower urinary tract infection) - will obtain urine culture - Rocephin for now   Hypothyroidism - will check TSH and will continue  Code Status: Full Family Communication: Pt at bedside Disposition Plan: PT evaluation    Review of Systems:  Constitutional: Negative for fever, chills and malaise/fatigue. Negative for diaphoresis.  HENT: Negative for hearing loss, ear pain, nosebleeds, congestion, sore throat, neck pain, tinnitus and ear discharge.   Eyes: Negative for blurred vision, double vision, photophobia, pain, discharge  and redness.  Respiratory: Negative for cough, hemoptysis, sputum production, shortness of breath, wheezing and stridor.   Cardiovascular: Negative for chest pain, palpitations, orthopnea, claudication and leg swelling.  Gastrointestinal: Negative for nausea, vomiting and abdominal pain. Negative for heartburn, constipation, blood in stool and melena.  Genitourinary: Negative for dysuria, urgency, frequency, hematuria and flank pain.  Musculoskeletal: Negative for myalgias, back pain, joint pain and falls.  Skin: Negative for itching and rash.  Neurological: Positive for dizziness and weakness. Negative for tingling, tremors, sensory change, speech change, focal weakness, loss of consciousness and headaches.  Endo/Heme/Allergies: Negative for environmental allergies and polydipsia. Does not bruise/bleed easily.  Psychiatric/Behavioral: Negative for suicidal ideas. The patient is not nervous/anxious.      Past Medical History  Diagnosis Date  . Anxiety   . HTN (hypertension)   . Atrial fibrillation   . Dementia   . Tremor   . History of pneumonia   . Hypothyroid   . Osteoporosis     prev intolerant of alendronate  . Myocardial infarction   . Hypothyroidism   . Anemia   . Hypertension   . Closed left hip fracture   . Arthritis   . DEMENTIA     Past Surgical History  Procedure Date  . Head surgery     for trigeminal neuralgia  . Total hip arthroplasty   . Cataract extraction   . Appendectomy   . Cataract extraction, bilateral   . Right rotator cuff repair   . Screw to left hip fracture     Social History:  reports that she has never smoked. She  has never used smokeless tobacco. She reports that she does not drink alcohol or use illicit drugs.  Allergies  Allergen Reactions  . Alendronate Sodium   . Aricept (Donepezil Hydrochloride)   . Warfarin Sodium     Held after fall 12/2011    Family History  Problem Relation Age of Onset  . Cancer    . Cancer Sister      breast cancer  . Cancer Brother     lung cancer    Prior to Admission medications   Medication Sig Start Date End Date Taking? Authorizing Provider  buPROPion (WELLBUTRIN) 100 MG tablet Take 100 mg by mouth daily.   Yes Historical Provider, MD  Calcium Carbonate-Vitamin D (OYSTER SHELL CALCIUM 500 + D PO) Take 1 tablet by mouth daily.   Yes Historical Provider, MD  cholecalciferol (VITAMIN D) 400 UNITS TABS Take 400 Units by mouth 2 (two) times daily.   Yes Historical Provider, MD  digoxin (LANOXIN) 0.125 MG tablet Take 125 mcg by mouth daily.   Yes Historical Provider, MD  docusate sodium (COLACE) 100 MG capsule Take 100 mg by mouth every evening.   Yes Historical Provider, MD  ferrous sulfate 325 (65 FE) MG tablet Take 325 mg by mouth 2 (two) times daily.     Yes Historical Provider, MD  furosemide (LASIX) 20 MG tablet Take 20 mg by mouth See admin instructions. Pt takes 1 tab every day except Saturday and Monday   Yes Historical Provider, MD  levothyroxine (SYNTHROID, LEVOTHROID) 50 MCG tablet Take 1 tablet (50 mcg total) by mouth daily. 6 days a week, not on Sundays 08/12/12  Yes Joaquim Nam, MD  metoprolol tartrate (LOPRESSOR) 25 MG tablet Take 25 mg by mouth 2 (two) times daily.   Yes Historical Provider, MD  Multiple Vitamin (MULTIVITAMIN) tablet Take 1 tablet by mouth daily.    Yes Historical Provider, MD  senna (SENOKOT) 8.6 MG TABS Take 1 tablet by mouth every evening.   Yes Historical Provider, MD  sertraline (ZOLOFT) 25 MG tablet Take 25 mg by mouth daily.   Yes Historical Provider, MD    Physical Exam: Filed Vitals:   08/25/12 1253 08/25/12 1255 08/25/12 1258 08/25/12 1449  BP: 117/56 132/60 71/47 159/75  Pulse: 78 86 92 89  Temp:    97.6 F (36.4 C)  TempSrc:    Oral  Resp: 14 19 14 16   Height:    5\' 4"  (1.626 m)  Weight:    44.1 kg (97 lb 3.6 oz)  SpO2: 97% 97% 100% 100%    Physical Exam  Constitutional: Appears well-developed and well-nourished. No distress.    HENT: Normocephalic. External right and left ear normal. Oropharynx is clear and moist.  Eyes: Conjunctivae and EOM are normal. PERRLA, no scleral icterus.  Neck: Normal ROM. Neck supple. No JVD. No tracheal deviation. No thyromegaly.  CVS: RRR, S1/S2 +, no murmurs, no gallops, no carotid bruit.  Pulmonary: Effort and breath sounds normal, no stridor, rhonchi, wheezes, rales.  Abdominal: Soft. BS +,  no distension, tenderness, rebound or guarding.  Musculoskeletal: Normal range of motion. No edema and no tenderness.  Lymphadenopathy: No lymphadenopathy noted, cervical, inguinal. Neuro: Alert. Normal reflexes, muscle tone coordination. No cranial nerve deficit. Skin: Skin is warm and dry. No rash noted. Not diaphoretic. No erythema. No pallor.  Psychiatric: Normal mood and affect. Behavior, judgment, thought content normal.   Labs on Admission:  Basic Metabolic Panel:  Lab 08/25/12 4098  NA 140  K 3.9  CL 100  CO2 33*  GLUCOSE 93  BUN 17  CREATININE 1.02  CALCIUM 10.1  MG --  PHOS --   Liver Function Tests:  Lab 08/25/12 1120  AST 19  ALT 10  ALKPHOS 85  BILITOT 0.4  PROT 6.6  ALBUMIN 3.5   CBC:  Lab 08/25/12 1120  WBC 4.7  NEUTROABS 2.9  HGB 13.7  HCT 41.5  MCV 90.6  PLT 86*   Cardiac Enzymes:  Lab 08/25/12 1130  CKTOTAL --  CKMB --  CKMBINDEX --  TROPONINI <0.30    Radiological Exams on Admission: Dg Chest Portable 1 View  08/25/2012  *RADIOLOGY REPORT*  Clinical Data: Loss of consciousness, hypertension, dementia  PORTABLE CHEST - 1 VIEW  Comparison: 10/09/2010; 09/21/2010; chest CT - 09/22/2010  Findings:  Grossly unchanged cardiac silhouette and mediastinal contours with mild tortuosity the thoracic aorta.  The lungs remain hyperexpanded with flattening of bilateral hemidiaphragms. Grossly unchanged left basilar heterogeneous opacities favored to represent atelectasis or scar.  No new focal airspace opacities.  No definite pleural effusion or  pneumothorax. Grossly unchanged bones.  IMPRESSION: 1.  Hyperexpanded lungs without definite acute cardiopulmonary disease. 2.  Chronic left basilar opacities favored to represent atelectasis or scar.   Original Report Authenticated By: Waynard Reeds, M.D.     EKG: Normal sinus rhythm, no ST/T wave changes  Debbora Presto, MD  [facility] Pager [phone]  If 7PM-7AM, please contact night-coverage [email or web address] Password [email or web address] 08/25/2012, 3:18 PM

## 2012-08-25 NOTE — ED Notes (Signed)
Per EMS, caregiver had pt on pot getting sponge bath.  Caregiver left room for a minute and when she returned pt was slumped over to the side.  Pt appeared altered for approximately 10 min.  Upon EMS arrival, pt back to baseline.  No stroke symptoms, denies CP.  CBG 122.

## 2012-08-25 NOTE — ED Provider Notes (Signed)
History     CSN: [id number]  Arrival date & time 08/25/12  1030   First MD Initiated Contact with Patient 08/25/12 1043      Chief Complaint  Patient presents with  . Loss of Consciousness    (Consider location/radiation/quality/duration/timing/severity/associated sxs/prior treatment) HPI Comments: [name] is a 78 y.o. Female who presents with complaint of syncope. Per family, pt was at home, sitting on commode, getting ready for a sponge bath by her care giver, when the care giver stepped out for a minute. When they returned, pt was leaning forward and was unresponsive. Per caregiver, pt appeared pale and was unconscious. She slowly came back to it after several seconds, back to herself over 10 min. Pt denies any symptoms and does not recall what happened. Pt has hx of dementia. Pt did not hit the ground, no head injury.    Past Medical History  Diagnosis Date  . Anxiety   . HTN (hypertension)   . Atrial fibrillation   . Dementia   . Tremor   . History of pneumonia   . Hypothyroid   . Osteoporosis     prev intolerant of alendronate  . Myocardial infarction   . Hypothyroidism   . Anemia   . Hypertension   . Closed left hip fracture   . Arthritis   . DEMENTIA     Past Surgical History  Procedure Date  . Head surgery     for trigeminal neuralgia  . Total hip arthroplasty   . Cataract extraction   . Appendectomy   . Cataract extraction, bilateral   . Right rotator cuff repair   . Screw to left hip fracture     Family History  Problem Relation Age of Onset  . Cancer    . Cancer Sister     breast cancer  . Cancer Brother     lung cancer    History  Substance Use Topics  . Smoking status: Never Smoker   . Smokeless tobacco: Never Used  . Alcohol Use: No    OB History    Grav Para Term Preterm Abortions TAB SAB Ect Mult Living                  Review of Systems  Unable to perform ROS: Dementia  Constitutional: Negative for fever and chills.    HENT: Negative for neck pain.   Cardiovascular: Positive for syncope.  Neurological: Positive for syncope.    Allergies  Alendronate sodium; Aricept; and Warfarin sodium  Home Medications   Current Outpatient Rx  Name Route Sig Dispense Refill  . BUPROPION HCL 100 MG PO TABS Oral Take 100 mg by mouth daily.    . OYSTER SHELL CALCIUM 500 + D PO Oral Take 1 tablet by mouth daily.    . CHOLECALCIFEROL 400 UNITS PO TABS Oral Take 400 Units by mouth 2 (two) times daily.    Marland Kitchen DIGOXIN 0.125 MG PO TABS Oral Take 125 mcg by mouth daily.    Marland Kitchen DOCUSATE SODIUM 100 MG PO CAPS Oral Take 100 mg by mouth every evening.    Marland Kitchen FERROUS SULFATE 325 (65 FE) MG PO TABS Oral Take 325 mg by mouth 2 (two) times daily.      . FUROSEMIDE 20 MG PO TABS Oral Take 20 mg by mouth See admin instructions. Pt takes 1 tab every day except Saturday and Monday    . LEVOTHYROXINE SODIUM 50 MCG PO TABS Oral Take 1 tablet (50 mcg  total) by mouth daily. 6 days a week, not on Sundays 90 tablet 1  . METOPROLOL TARTRATE 25 MG PO TABS Oral Take 25 mg by mouth 2 (two) times daily.    Marland Kitchen ONE-DAILY MULTI VITAMINS PO TABS Oral Take 1 tablet by mouth daily.     . SENNA 8.6 MG PO TABS Oral Take 1 tablet by mouth every evening.    Marland Kitchen SERTRALINE HCL 25 MG PO TABS Oral Take 25 mg by mouth daily.      BP 103/56  Pulse 69  Temp 97.7 F (36.5 C) (Oral)  Resp 18  SpO2 97%  Physical Exam  Nursing note and vitals reviewed. Constitutional: She appears well-developed and well-nourished. No distress.  HENT:  Head: Normocephalic and atraumatic.  Eyes: Conjunctivae normal and EOM are normal. Pupils are equal, round, and reactive to light.  Neck: Neck supple.  Cardiovascular: Normal rate, regular rhythm and normal heart sounds.   Pulmonary/Chest: Effort normal and breath sounds normal. No respiratory distress. She has no wheezes. She has no rales.  Abdominal: Soft. Bowel sounds are normal. She exhibits no distension. There is no tenderness.   Musculoskeletal: Normal range of motion. She exhibits no edema.  Neurological: She is alert. She has normal reflexes.       Oriented to self only. Normal 5/5 and equal grip strength bilaterally. Equal dorsiflexion and plantarflexion of feet. No pronator drift.   Skin: Skin is warm and dry.  Psychiatric: She has a normal mood and affect.    ED Course  Procedures (including critical care time)  PT with syncopal episode. Labs, ECG pending.    Date: 08/25/2012  Rate: 55  Rhythm: sinus bradycardia  QRS Axis: normal  Intervals: normal  ST/T Wave abnormalities: nonspecific ST changes  Conduction Disutrbances:none  Narrative Interpretation:   Old EKG Reviewed: changes noted new bradycardia.   Results for orders placed during the hospital encounter of 08/25/12  CBC WITH DIFFERENTIAL      Component Value Range   WBC 4.7  4.0 - 10.5 K/uL   RBC 4.58  3.87 - 5.11 MIL/uL   Hemoglobin 13.7  12.0 - 15.0 g/dL   HCT 09.8  11.9 - 14.7 %   MCV 90.6  78.0 - 100.0 fL   MCH 29.9  26.0 - 34.0 pg   MCHC 33.0  30.0 - 36.0 g/dL   RDW 82.9  56.2 - 13.0 %   Platelets 86 (*) 150 - 400 K/uL   Neutrophils Relative 61  43 - 77 %   Neutro Abs 2.9  1.7 - 7.7 K/uL   Lymphocytes Relative 28  12 - 46 %   Lymphs Abs 1.3  0.7 - 4.0 K/uL   Monocytes Relative 11  3 - 12 %   Monocytes Absolute 0.5  0.1 - 1.0 K/uL   Eosinophils Relative 0  0 - 5 %   Eosinophils Absolute 0.0  0.0 - 0.7 K/uL   Basophils Relative 0  0 - 1 %   Basophils Absolute 0.0  0.0 - 0.1 K/uL  COMPREHENSIVE METABOLIC PANEL      Component Value Range   Sodium 140  135 - 145 mEq/L   Potassium 3.9  3.5 - 5.1 mEq/L   Chloride 100  96 - 112 mEq/L   CO2 33 (*) 19 - 32 mEq/L   Glucose, Bld 93  70 - 99 mg/dL   BUN 17  6 - 23 mg/dL   Creatinine, Ser 8.65  0.50 - 1.10 mg/dL  Calcium 10.1  8.4 - 10.5 mg/dL   Total Protein 6.6  6.0 - 8.3 g/dL   Albumin 3.5  3.5 - 5.2 g/dL   AST 19  0 - 37 U/L   ALT 10  0 - 35 U/L   Alkaline Phosphatase 85  39  - 117 U/L   Total Bilirubin 0.4  0.3 - 1.2 mg/dL   GFR calc non Af Amer 48 (*) >90 mL/min   GFR calc Af Amer 55 (*) >90 mL/min  TROPONIN I      Component Value Range   Troponin I <0.30  <0.30 ng/mL  URINALYSIS, ROUTINE W REFLEX MICROSCOPIC      Component Value Range   Color, Urine YELLOW  YELLOW   APPearance CLOUDY (*) CLEAR   Specific Gravity, Urine 1.020  1.005 - 1.030   pH 7.5  5.0 - 8.0   Glucose, UA NEGATIVE  NEGATIVE mg/dL   Hgb urine dipstick NEGATIVE  NEGATIVE   Bilirubin Urine NEGATIVE  NEGATIVE   Ketones, ur 15 (*) NEGATIVE mg/dL   Protein, ur 784 (*) NEGATIVE mg/dL   Urobilinogen, UA 1.0  0.0 - 1.0 mg/dL   Nitrite NEGATIVE  NEGATIVE   Leukocytes, UA MODERATE (*) NEGATIVE  URINE MICROSCOPIC-ADD ON      Component Value Range   Squamous Epithelial / LPF RARE  RARE   WBC, UA 11-20  <3 WBC/hpf   RBC / HPF 0-2  <3 RBC/hpf   Bacteria, UA MANY (*) RARE   Casts HYALINE CASTS (*) NEGATIVE   Crystals CA OXALATE CRYSTALS (*) NEGATIVE   Urine-Other AMORPHOUS URATES/PHOSPHATES    URINE CULTURE      Component Value Range   Specimen Description URINE, CATHETERIZED     Special Requests NONE     Culture  Setup Time 08/25/2012 14:30     Colony Count PENDING     Culture Culture reincubated for better growth     Report Status PENDING    MAGNESIUM      Component Value Range   Magnesium 2.3  1.5 - 2.5 mg/dL  PHOSPHORUS      Component Value Range   Phosphorus 2.8  2.3 - 4.6 mg/dL  TSH      Component Value Range   TSH 1.052  0.350 - 4.500 uIU/mL  TROPONIN I      Component Value Range   Troponin I <0.30  <0.30 ng/mL  TROPONIN I      Component Value Range   Troponin I <0.30  <0.30 ng/mL  TROPONIN I      Component Value Range   Troponin I <0.30  <0.30 ng/mL  BASIC METABOLIC PANEL      Component Value Range   Sodium 143  135 - 145 mEq/L   Potassium 3.9  3.5 - 5.1 mEq/L   Chloride 108  96 - 112 mEq/L   CO2 28  19 - 32 mEq/L   Glucose, Bld 98  70 - 99 mg/dL   BUN 13  6 - 23  mg/dL   Creatinine, Ser 6.96  0.50 - 1.10 mg/dL   Calcium 9.2  8.4 - 29.5 mg/dL   GFR calc non Af Amer 73 (*) >90 mL/min   GFR calc Af Amer 84 (*) >90 mL/min  CBC      Component Value Range   WBC 3.3 (*) 4.0 - 10.5 K/uL   RBC 4.22  3.87 - 5.11 MIL/uL   Hemoglobin 12.9  12.0 - 15.0 g/dL  HCT 37.8  36.0 - 46.0 %   MCV 89.6  78.0 - 100.0 fL   MCH 30.6  26.0 - 34.0 pg   MCHC 34.1  30.0 - 36.0 g/dL   RDW 78.2  95.6 - 21.3 %   Platelets 84 (*) 150 - 400 K/uL  GLUCOSE, CAPILLARY      Component Value Range   Glucose-Capillary 90  70 - 99 mg/dL   Comment 1 Notify RN     Dg Chest Portable 1 View  08/25/2012  *RADIOLOGY REPORT*  Clinical Data: Loss of consciousness, hypertension, dementia  PORTABLE CHEST - 1 VIEW  Comparison: 10/09/2010; 09/21/2010; chest CT - 09/22/2010  Findings:  Grossly unchanged cardiac silhouette and mediastinal contours with mild tortuosity the thoracic aorta.  The lungs remain hyperexpanded with flattening of bilateral hemidiaphragms. Grossly unchanged left basilar heterogeneous opacities favored to represent atelectasis or scar.  No new focal airspace opacities.  No definite pleural effusion or pneumothorax. Grossly unchanged bones.  IMPRESSION: 1.  Hyperexpanded lungs without definite acute cardiopulmonary disease. 2.  Chronic left basilar opacities favored to represent atelectasis or scar.   Original Report Authenticated By: Waynard Reeds, M.D.     Pt is orthostatic. Unremarkable labs, UTI, rocephin started. Will admit for orthostatic vitals, syncope work up, uti treatment.    1. Orthostatic hypotension   2. Syncope   3. UTI (lower urinary tract infection)   4. Anemia   5. Atrial fibrillation   6. Dementia   7. HTN (hypertension)   8. Hypothyroidism       MDM         Lottie Mussel, PA 08/26/12 1642

## 2012-08-25 NOTE — Plan of Care (Signed)
Problem: Consults Goal: Skin Care Protocol Initiated - if indicated If consults are not indicated, leave blank or document N/A Outcome: Completed/Met Date Met:  08/25/12  On Admission :Pt has a stage 1 on  sacral area with 7 x 8 cm reddened area applied avellyn foam dressing to area. Pt reportedly fell at home last week and has a scabbed over area on Rt forehead above her Rt eye 3 x 4 cm

## 2012-08-25 NOTE — Plan of Care (Signed)
Problem: Consults Goal: UTI/Pyelonephritis Patient Education See Patient Education Module for education specifics. Outcome: Completed/Met Date Met:  08/25/12 Talked with son about UTI his mother who is the patient has dementia/Hx of Alzheimer's according to son and has short term memory loss. Unable to teach patient Patient has 2 different caretaker's throughout the daytime

## 2012-08-25 NOTE — Telephone Encounter (Signed)
The patient's son called the triage line and reported he found his mother in her bathroom "slumped over on the toilet seat, and she had no color on her face".  He reported after assisting the patient back into an upright position, "the color came back to her face, but she still did not seem normal".  He was hoping to find out what he needed to do.  I asked the son if he had called 911, and he reported "no".  I spoke with [place] and she agreed that the patient's son needed to seek emergency care.  I relayed to the patient that he needed to hang up and call 911 immediately.

## 2012-08-26 DIAGNOSIS — I10 Essential (primary) hypertension: Secondary | ICD-10-CM

## 2012-08-26 DIAGNOSIS — E039 Hypothyroidism, unspecified: Secondary | ICD-10-CM

## 2012-08-26 LAB — CBC
Hemoglobin: 12.9 g/dL (ref 12.0–15.0)
MCH: 30.6 pg (ref 26.0–34.0)
MCHC: 34.1 g/dL (ref 30.0–36.0)
Platelets: 84 10*3/uL — ABNORMAL LOW (ref 150–400)
RDW: 13.8 % (ref 11.5–15.5)

## 2012-08-26 LAB — BASIC METABOLIC PANEL
BUN: 13 mg/dL (ref 6–23)
Calcium: 9.2 mg/dL (ref 8.4–10.5)
GFR calc Af Amer: 84 mL/min — ABNORMAL LOW (ref >90)
GFR calc non Af Amer: 73 mL/min — ABNORMAL LOW (ref >90)
Glucose, Bld: 98 mg/dL (ref 70–99)
Potassium: 3.9 mEq/L (ref 3.5–5.1)
Sodium: 143 mEq/L (ref 135–145)

## 2012-08-26 LAB — GLUCOSE, CAPILLARY: Glucose-Capillary: 90 mg/dL (ref 70–99)

## 2012-08-26 MED ORDER — ENSURE COMPLETE PO LIQD
237.0000 mL | Freq: Two times a day (BID) | ORAL | Status: DC
  Administered 2012-08-26 – 2012-08-28 (×5): 237 mL via ORAL

## 2012-08-26 NOTE — Progress Notes (Signed)
Clinical Social Work Department BRIEF PSYCHOSOCIAL ASSESSMENT 08/26/2012  Patient:  [name], [name]     Account Number:  [id number]     Admit date:  08/25/2012  Clinical Social Worker:  Juliette Mangle  Date/Time:  08/26/2012 10:00 AM  Referred by:  Physician  Date Referred:  08/26/2012 Referred for  SNF Placement   Other Referral:   Interview type:  Family Other interview type:   CSW spoke with patient's son over the phone    PSYCHOSOCIAL DATA Living Status:  ALONE Admitted from facility:   Level of care:   Primary support name:  [name] Primary support relationship to patient:  CHILD, ADULT Degree of support available:   Strong and vested    CURRENT CONCERNS Current Concerns  Post-Acute Placement   Other Concerns:    SOCIAL WORK ASSESSMENT / PLAN Clinical Social Worker received referral for the potential need for post acute placement at a SNF. CSW reviewed chart and met with patient. Patient has dementia and asked CSW to speak with her son. CSW contacted patient's son and introduced self, explained role, and discussed skilled nursing home placement/ ALF options. Patient's son reported that currently, his mom is receiving approximately 16 hours of personal care at her home daily. Patient's son reported that this is costly and the patient has a tendency to get out of bed and fall after the workers leave. Patient's son is frustrated because he cannot afford 24 hour care for his mother but does not want to put her in long term skilled nursing. CSW discussed possible short term skilled nursing and then discharging to an ALF. Patient's son was agreeable to this and requested [facility] due to it's several levels of care.  CSW reviewed the process of placement and answered all the patient's son's questions. Patient's son  is agreeable to CSW seeking placement. CSW will start seeking placement for patient   Assessment/plan status:  Psychosocial Support/Ongoing Assessment of  Needs Other assessment/ plan:   Information/referral to community resources:   [employer]    PATIENT'S/FAMILY'S RESPONSE TO PLAN OF CARE: Patient's son was very appreciative of support and information provided by CSW. CSW will continue to follow and will assist with all d/c needs.

## 2012-08-26 NOTE — Clinical Social Work Placement (Signed)
Clinical Social Work Department CLINICAL SOCIAL WORK PLACEMENT NOTE 08/26/2012  Patient:  [name], [name]  Account Number:  [id number] Admit date:  08/25/2012  Clinical Social Worker:  Nirel Babler Lubertha Basque  Date/time:  08/26/2012 04:39 PM  Clinical Social Work is seeking post-discharge placement for this patient at the following level of care:   SKILLED NURSING   (*CSW will update this form in Epic as items are completed)   08/26/2012  Patient/family provided with [facility] Department of Clinical Social Work's list of facilities offering this level of care within the geographic area requested by the patient (or if unable, by the patient's family).  08/26/2012  Patient/family informed of their freedom to choose among providers that offer the needed level of care, that participate in Medicare, Medicaid or managed care program needed by the patient, have an available bed and are willing to accept the patient.  08/26/2012  Patient/family informed of [facility]' ownership interest in [facility], as well as of the fact that they are under no obligation to receive care at this facility.  PASARR submitted to EDS on 08/26/2012 PASARR number received from EDS on   FL2 transmitted to all facilities in geographic area requested by pt/family on  08/26/2012 FL2 transmitted to all facilities within larger geographic area on   Patient informed that his/her managed care company has contracts with or will negotiate with  certain facilities, including the following:     Patient/family informed of bed offers received:   Patient chooses bed at  Physician recommends and patient chooses bed at    Patient to be transferred to  on   Patient to be transferred to facility by   The following physician request were entered in Epic:   Additional Comments:  Sabino Niemann, MSW, [employer] [phone]

## 2012-08-26 NOTE — Progress Notes (Signed)
Subjective: Feeling better today. No specific concerns.  Objective: Vital signs in last 24 hours: Filed Vitals:   08/26/12 0700 08/26/12 0710 08/26/12 0711 08/26/12 0712  BP: 140/97 125/93 133/84 0/0  Pulse: 61 85 95 0  Temp: 97.9 F (36.6 C)     TempSrc: Oral     Resp: 18     Height:      Weight: 45.8 kg (100 lb 15.5 oz)     SpO2: 99%      Weight change:   Intake/Output Summary (Last 24 hours) at 08/26/12 1004 Last data filed at 08/26/12 0851  Gross per 24 hour  Intake    480 ml  Output    352 ml  Net    128 ml    Physical Exam: General: Awake, No acute distress. HEENT: EOMI. Neck: Supple CV: S1 and S2 Lungs: Clear to ascultation bilaterally Abdomen: Soft, Nontender, Nondistended, +bowel sounds. Ext: Good pulses. Trace edema.  Lab Results: Basic Metabolic Panel:  Lab 08/26/12 1610 08/25/12 1629 08/25/12 1120  NA 143 -- 140  K 3.9 -- 3.9  CL 108 -- 100  CO2 28 -- 33*  GLUCOSE 98 -- 93  BUN 13 -- 17  CREATININE 0.78 -- 1.02  CALCIUM 9.2 -- 10.1  MG -- 2.3 --  PHOS -- 2.8 --   Liver Function Tests:  Lab 08/25/12 1120  AST 19  ALT 10  ALKPHOS 85  BILITOT 0.4  PROT 6.6  ALBUMIN 3.5   No results found for this basename: LIPASE:5,AMYLASE:5 in the last 168 hours No results found for this basename: AMMONIA:5 in the last 168 hours CBC:  Lab 08/26/12 0250 08/25/12 1120  WBC 3.3* 4.7  NEUTROABS -- 2.9  HGB 12.9 13.7  HCT 37.8 41.5  MCV 89.6 90.6  PLT 84* 86*   Cardiac Enzymes:  Lab 08/26/12 0250 08/25/12 2201 08/25/12 1629 08/25/12 1130  CKTOTAL -- -- -- --  CKMB -- -- -- --  CKMBINDEX -- -- -- --  TROPONINI <0.30 <0.30 <0.30 <0.30   BNP (last 3 results) No results found for this basename: PROBNP:3 in the last 8760 hours CBG:  Lab 08/26/12 0755  GLUCAP 90   No results found for this basename: HGBA1C:5 in the last 72 hours Other Labs: No components found with this basename: POCBNP:3 No results found for this basename: DDIMER:2 in the last  168 hours No results found for this basename: CHOL:2,HDL:2,LDLCALC:2,TRIG:2,CHOLHDL:2,LDLDIRECT:2 in the last 168 hours  Lab 08/25/12 1629  TSH 1.052  T4TOTAL --  T3FREE --  FREET4 --  THYROIDAB --   No results found for this basename: VITAMINB12:2,FOLATE:2,FERRITIN:2,TIBC:2,IRON:2,RETICCTPCT:2 in the last 168 hours  Micro Results: No results found for this or any previous visit (from the past 240 hour(s)).  Studies/Results: Dg Chest Portable 1 View  08/25/2012  *RADIOLOGY REPORT*  Clinical Data: Loss of consciousness, hypertension, dementia  PORTABLE CHEST - 1 VIEW  Comparison: 10/09/2010; 09/21/2010; chest CT - 09/22/2010  Findings:  Grossly unchanged cardiac silhouette and mediastinal contours with mild tortuosity the thoracic aorta.  The lungs remain hyperexpanded with flattening of bilateral hemidiaphragms. Grossly unchanged left basilar heterogeneous opacities favored to represent atelectasis or scar.  No new focal airspace opacities.  No definite pleural effusion or pneumothorax. Grossly unchanged bones.  IMPRESSION: 1.  Hyperexpanded lungs without definite acute cardiopulmonary disease. 2.  Chronic left basilar opacities favored to represent atelectasis or scar.   Original Report Authenticated By: Waynard Reeds, M.D.     Medications:  I have reviewed the patient's current medications. Scheduled Meds:   . buPROPion  100 mg Oral Daily  . cefTRIAXone (ROCEPHIN)  IV  1 g Intravenous Once  . cefTRIAXone (ROCEPHIN)  IV  1 g Intravenous Q24H  . digoxin  125 mcg Oral Daily  . docusate sodium  100 mg Oral QPM  . enoxaparin (LOVENOX) injection  30 mg Subcutaneous Q24H  . ferrous sulfate  325 mg Oral BID  . levothyroxine  50 mcg Oral Daily  . senna  1 tablet Oral QPM  . sertraline  25 mg Oral Daily  . sodium chloride  500 mL Intravenous Once  . sodium chloride  3 mL Intravenous Q12H  . DISCONTD: sodium chloride   Intravenous Once  . DISCONTD: sodium chloride   Intravenous STAT  .  DISCONTD: metoprolol tartrate  25 mg Oral BID   Continuous Infusions:   . sodium chloride 50 mL/hr (08/25/12 1700)   PRN Meds:.ondansetron (ZOFRAN) IV, ondansetron, DISCONTD: ondansetron (ZOFRAN) IV  Assessment/Plan: Syncope  Likely secondary to orthostatics in the setting of bradycardia.  Continue to hold metoprolol and Lasix.  TSH normal.  Saline lock fluids.  Recheck orthostatics tomorrow.  Patient had a 2-D echocardiogram in February of 2013 which showed cavity size was normal, systolic function was normal, ejection fraction 50-55%, regional wall motion abnormalities cannot be excluded.  Do not see the need to repeat echocardiogram at this time.  Continue on telemetry.  Atrial fibrillation  Rate controlled.  Continue digoxin.  Hold metoprolol due to bradycardia, if tachycardic will restart metoprolol.  HTN (hypertension)  Hypotensive on admission, hold metoprolol and furosemide.  Dementia  Stable.   UTI (lower urinary tract infection)  Continue ceftriaxone.  Antibiotics since 08/25/2012.  Urine culture from 08/25/2012 pending.  Hypothyroidism  TSH normal.  Continue levothyroxine.  Thrombocytopenia Appears to be chronic.  Code Status: Full  Family Communication: Pt at bedside  Disposition Plan: PT evaluation recommending SNF versus ALF, social worker notified of the disposition.   LOS: 1 day  Santiago Graf A, MD 08/26/2012, 10:04 AM

## 2012-08-26 NOTE — Evaluation (Addendum)
Physical Therapy Evaluation Patient Details Name: [name] MRN: [id number] DOB: Nov 15, 1922 Today's Date: 08/26/2012 Time:  -     PT Assessment / Plan / Recommendation Clinical Impression  Pt admitted after syncopal event at home. Pt with dementia and unable to provide accurate history. Spoke with son Chrissie Noa on the phone who provided PLOF and stated caregivers are present during the day but do leave at periods to run errands like grocery shopping where pt is left home alone. At night pt has bed rails and per son once the caregivers put her to bed she does not try to get up around the rails and has assist OOB in the morning. Son very aware of pt cognitive deficits and is agreeable to acute therapy to maximize mobility  for pt to return home at baseline. Pt's son stated long term physical therapy at facility or home would not be beneficial due to lack of pt STM and carryover of instruction. Son is quite frustrated because he is aware of pt balance deficits and memory issues and cannot figure out how to best provide for pt due to fact he cannot afford 24hr supervision at home and even if placed in an ALF or SNF knows that staff is not physically present in the room for supervision 24hrs/day, however recommended potential bed alarms and ready presence of staff vs no one present in the home. Recommended son speak with SW to discuss payment and facility options due to financial and caregiver burdens appear heavily frustrating and concerning for him regarding long term care options. Will follow acutely with plans for no further therapy outside acute setting.     PT Assessment  Patient needs continued PT services    Follow Up Recommendations  No PT follow up;Other (comment) (daily mobility with nursing staff), 24 hr supervision    Barriers to Discharge Decreased caregiver support caregivers grossly 16hrs/day    Equipment Recommendations  None recommended by PT    Recommendations for Other  Services     Frequency Min 2X/week    Precautions / Restrictions     Pertinent Vitals/Pain No pain      Mobility  Bed Mobility Bed Mobility: Supine to Sit;Sit to Supine Supine to Sit: 4: Min assist;With rails;HOB elevated Details for Bed Mobility Assistance: cueing for elevating trunk with assist to complete and use of rails Transfers Transfers: Sit to Stand;Stand to Sit Sit to Stand: 4: Min assist;From bed;From toilet Stand to Sit: 4: Min assist;To toilet;To chair/3-in-1 Details for Transfer Assistance: cueing for hand placement and safety pt sitting prematurely prior to reaching surface to fully sit at surface Ambulation/Gait Ambulation/Gait Assistance: 4: Min assist Ambulation Distance (Feet): 15 Feet (x 2 trials) Assistive device: Rolling walker Ambulation/Gait Assistance Details: max cueing for safety and position in RW  Gait Pattern: Shuffle;Trunk flexed;Narrow base of support Gait velocity: decreased Stairs: No    Shoulder Instructions     Exercises     PT Diagnosis: Difficulty walking;Altered mental status  PT Problem List: Decreased strength;Decreased activity tolerance;Decreased balance;Decreased mobility;Decreased coordination;Decreased safety awareness;Decreased cognition;Decreased knowledge of use of DME PT Treatment Interventions: Gait training;Functional mobility training;Therapeutic activities;Therapeutic exercise;Patient/family education;DME instruction   PT Goals Acute Rehab PT Goals PT Goal Formulation: With patient/family Time For Goal Achievement: 09/02/12 Potential to Achieve Goals: Fair Pt will go Supine/Side to Sit: with supervision;with HOB 0 degrees PT Goal: Supine/Side to Sit - Progress: Goal set today Pt will go Sit to Stand: with supervision PT Goal: Sit to Stand - Progress:  Goal set today Pt will go Stand to Sit: with supervision PT Goal: Stand to Sit - Progress: Goal set today Pt will Ambulate: 16 - 50 feet;with min assist;with rolling  walker PT Goal: Ambulate - Progress: Goal set today  Visit Information  Last PT Received On: 08/26/12 Assistance Needed: +1    Subjective Data      Prior Functioning  Home Living Lives With: Alone Available Help at Discharge: Other (Comment);Family;Personal care attendant (2 caregivers during day and son at night) Type of Home: House Home Access: Ramped entrance Entrance Stairs-Rails: None Home Layout: One level Bathroom Shower/Tub: Engineer, manufacturing systems: Standard Home Adaptive Equipment: Shower chair with back;Walker - rolling;Wheelchair - manual;Bedside commode/3-in-1 Prior Function Level of Independence: Needs assistance Needs Assistance: Bathing;Dressing;Gait;Transfers;Light Housekeeping;Meal Prep Bath: Moderate Dressing: Moderate Meal Prep: Total Light Housekeeping: Total Gait Assistance: min assist with RW Transfer Assistance: min assist Able to Take Stairs?: No Driving: No Vocation: Retired Musician: Clinical cytogeneticist  Overall Cognitive Status: History of cognitive impairments - at baseline Arousal/Alertness: Awake/alert Orientation Level: Disoriented to;Time;Place;Situation Behavior During Session: WFL for tasks performed Cognition - Other Comments: pt with very limited STM    Extremity/Trunk Assessment Right Lower Extremity Assessment RLE ROM/Strength/Tone: Deficits;Unable to fully assess;Due to impaired cognition RLE ROM/Strength/Tone Deficits: grossly 2+/5 Left Lower Extremity Assessment LLE ROM/Strength/Tone: Deficits;Unable to fully assess;Due to impaired cognition LLE ROM/Strength/Tone Deficits: grossly 2+/5 Trunk Assessment Trunk Assessment: Kyphotic   Balance Static Sitting Balance Static Sitting - Balance Support: Bilateral upper extremity supported;Feet supported Static Sitting - Level of Assistance: 5: Stand by assistance Static Sitting - Comment/# of Minutes: 3 Static Standing Balance Static Standing - Balance  Support: Bilateral upper extremity supported Static Standing - Level of Assistance: 4: Min assist Static Standing - Comment/# of Minutes: 3  End of Session PT - End of Session Equipment Utilized During Treatment: Gait belt Activity Tolerance: Patient tolerated treatment well Patient left: in chair;with chair alarm set Nurse Communication: Mobility status  GP     Toney Sang Beth 08/26/2012, 10:11 AM  Delaney Meigs, PT [phone]

## 2012-08-26 NOTE — Progress Notes (Signed)
INITIAL ADULT NUTRITION ASSESSMENT Date: 08/26/2012   Time: 1:53 PM Reason for Assessment: Health history   INTERVENTION: 1. Ensure Complete po BID, each supplement provides 350 kcal and 13 grams of protein. 2. RD will continue to follow    DOCUMENTATION CODES Per approved criteria  -Non-severe (moderate) malnutrition in the context of chronic illness -Underweight    ASSESSMENT: Female 78 y.o.  Dx: syncopal episode   Hx:  Past Medical History  Diagnosis Date  . Anxiety   . HTN (hypertension)   . Atrial fibrillation   . Dementia   . Tremor   . Osteoporosis     prev intolerant of alendronate  . Myocardial infarction   . Anemia   . Hypertension   . Closed left hip fracture   . Arthritis   . DEMENTIA   . Pneumonia   . Hypothyroid   . Hypothyroidism   . Depression     Related Meds:     . buPROPion  100 mg Oral Daily  . cefTRIAXone (ROCEPHIN)  IV  1 g Intravenous Once  . cefTRIAXone (ROCEPHIN)  IV  1 g Intravenous Q24H  . digoxin  125 mcg Oral Daily  . docusate sodium  100 mg Oral QPM  . enoxaparin (LOVENOX) injection  30 mg Subcutaneous Q24H  . ferrous sulfate  325 mg Oral BID  . levothyroxine  50 mcg Oral Daily  . senna  1 tablet Oral QPM  . sertraline  25 mg Oral Daily  . sodium chloride  3 mL Intravenous Q12H  . DISCONTD: sodium chloride   Intravenous Once  . DISCONTD: sodium chloride   Intravenous STAT  . DISCONTD: metoprolol tartrate  25 mg Oral BID     Ht: 5\' 4"  (162.6 cm)  Wt: 100 lb 15.5 oz (45.8 kg) (scale A)  Ideal Wt: 54.4 kg  % Ideal Wt: 84%  Usual Wt:  Wt Readings from Last 5 Encounters:  08/26/12 100 lb 15.5 oz (45.8 kg)  05/21/12 130 lb 1.1 oz (59 kg)  05/21/12 130 lb 1.1 oz (59 kg)  02/05/12 112 lb (50.803 kg)  01/03/12 117 lb 4.8 oz (53.207 kg)    % Usual Wt: 77%  Body mass index is 17.33 kg/(m^2). Pt is underweight per current BMI   Food/Nutrition Related Hx: Pt denies weight loss or poor appetite  Labs:  CMP       Component Value Date/Time   NA 143 08/26/2012 0250   K 3.9 08/26/2012 0250   CL 108 08/26/2012 0250   CO2 28 08/26/2012 0250   GLUCOSE 98 08/26/2012 0250   BUN 13 08/26/2012 0250   CREATININE 0.78 08/26/2012 0250   CALCIUM 9.2 08/26/2012 0250   CALCIUM 8.9 05/18/2012 0655   PROT 6.6 08/25/2012 1120   ALBUMIN 3.5 08/25/2012 1120   AST 19 08/25/2012 1120   ALT 10 08/25/2012 1120   ALKPHOS 85 08/25/2012 1120   BILITOT 0.4 08/25/2012 1120   GFRNONAA 73* 08/26/2012 0250   GFRAA 84* 08/26/2012 0250    Intake/Output Summary (Last 24 hours) at 08/26/12 1356 Last data filed at 08/26/12 1050  Gross per 24 hour  Intake    683 ml  Output    352 ml  Net    331 ml     Diet Order: General  Supplements/Tube Feeding: none   IVF:    sodium chloride Last Rate: 50 mL/hr (08/25/12 1700)    Estimated Nutritional Needs:   Kcal: 1200-1400 Protein:  45-55 gm  Fluid:  1.4-1.6 L   RD was pulled to pt with low BMI and possible weight loss.  Pt states that she has a good appetite and has not lost any weight. Noted, pt with dementia, so question accuracy. No other family available at time of RD visit to provide hx. Per notes, pt lives with family who provides care.   Pt was observed with lunch tray, about 50% completion.  Per weight hx, pt has lost 30 lbs in 4 months, 23%, severe weight loss. Pt with some wasting noted at the temples.   Pt meets criteria for moderate malnutrition in the context of chronic illness 2/2 weight loss of 23% in 4 months and mild wasting noted at temples.   NUTRITION DIAGNOSIS: -Inadequate oral intake (NI-2.1).  Status: Ongoing  RELATED TO: decreased appetite with advanced age/dementia  AS EVIDENCE BY: weight loss  MONITORING/EVALUATION(Goals): Goal: PO intake of meals to meet >90% estimated nutrition needs  Monitor: PO intake, weight, labs  EDUCATION NEEDS: -No education needs identified at this time    Clarene Duke RD, LDN Pager [phone] After Hours pager  [phone]  08/26/2012, 1:53 PM

## 2012-08-26 NOTE — Progress Notes (Signed)
Utilization Review Completed.  

## 2012-08-27 DIAGNOSIS — N390 Urinary tract infection, site not specified: Secondary | ICD-10-CM

## 2012-08-27 LAB — CBC
Hemoglobin: 13.5 g/dL (ref 12.0–15.0)
MCH: 30.7 pg (ref 26.0–34.0)
MCV: 90.9 fL (ref 78.0–100.0)
Platelets: 82 10*3/uL — ABNORMAL LOW (ref 150–400)
RBC: 4.4 MIL/uL (ref 3.87–5.11)

## 2012-08-27 LAB — BASIC METABOLIC PANEL
BUN: 11 mg/dL (ref 6–23)
CO2: 26 mEq/L (ref 19–32)
Calcium: 9.5 mg/dL (ref 8.4–10.5)
Creatinine, Ser: 0.73 mg/dL (ref 0.50–1.10)
Glucose, Bld: 91 mg/dL (ref 70–99)

## 2012-08-27 MED ORDER — METOPROLOL TARTRATE 12.5 MG HALF TABLET
12.5000 mg | ORAL_TABLET | Freq: Two times a day (BID) | ORAL | Status: DC
  Administered 2012-08-27 – 2012-08-28 (×2): 12.5 mg via ORAL
  Filled 2012-08-27 (×3): qty 1

## 2012-08-27 NOTE — Progress Notes (Signed)
Subjective: No specific complaints. Denies any pain.  Objective: Vital signs in last 24 hours: Filed Vitals:   08/26/12 2233 08/27/12 0445 08/27/12 0842 08/27/12 0935  BP: 119/73 138/91 131/58   Pulse: 93 90 95 98  Temp: 97.9 F (36.6 C) 97.8 F (36.6 C)    TempSrc: Oral Oral    Resp: 18 20    Height:      Weight:  46.2 kg (101 lb 13.6 oz)    SpO2: 99% 97%     Weight change: 2.1 kg (4 lb 10.1 oz)  Intake/Output Summary (Last 24 hours) at 08/27/12 1017 Last data filed at 08/27/12 0841  Gross per 24 hour  Intake    563 ml  Output    790 ml  Net   -227 ml    Physical Exam: General: Awake, No acute distress. HEENT: EOMI. Neck: Supple CV: S1 and S2 Lungs: Clear to ascultation bilaterally Abdomen: Soft, Nontender, Nondistended, +bowel sounds. Ext: Good pulses. Trace edema.  Lab Results: Basic Metabolic Panel:  Lab 08/27/12 0981 08/26/12 0250 08/25/12 1629 08/25/12 1120  NA 145 143 -- 140  K 3.5 3.9 -- 3.9  CL 108 108 -- 100  CO2 26 28 -- 33*  GLUCOSE 91 98 -- 93  BUN 11 13 -- 17  CREATININE 0.73 0.78 -- 1.02  CALCIUM 9.5 9.2 -- 10.1  MG -- -- 2.3 --  PHOS -- -- 2.8 --   Liver Function Tests:  Lab 08/25/12 1120  AST 19  ALT 10  ALKPHOS 85  BILITOT 0.4  PROT 6.6  ALBUMIN 3.5   No results found for this basename: LIPASE:5,AMYLASE:5 in the last 168 hours No results found for this basename: AMMONIA:5 in the last 168 hours CBC:  Lab 08/27/12 0640 08/26/12 0250 08/25/12 1120  WBC 3.3* 3.3* 4.7  NEUTROABS -- -- 2.9  HGB 13.5 12.9 13.7  HCT 40.0 37.8 41.5  MCV 90.9 89.6 90.6  PLT 82* 84* 86*   Cardiac Enzymes:  Lab 08/26/12 0250 08/25/12 2201 08/25/12 1629 08/25/12 1130  CKTOTAL -- -- -- --  CKMB -- -- -- --  CKMBINDEX -- -- -- --  TROPONINI <0.30 <0.30 <0.30 <0.30   BNP (last 3 results) No results found for this basename: PROBNP:3 in the last 8760 hours CBG:  Lab 08/27/12 0621 08/26/12 0755  GLUCAP 87 90   No results found for this basename:  HGBA1C:5 in the last 72 hours Other Labs: No components found with this basename: POCBNP:3 No results found for this basename: DDIMER:2 in the last 168 hours No results found for this basename: CHOL:2,HDL:2,LDLCALC:2,TRIG:2,CHOLHDL:2,LDLDIRECT:2 in the last 168 hours  Lab 08/25/12 1629  TSH 1.052  T4TOTAL --  T3FREE --  FREET4 --  THYROIDAB --   No results found for this basename: VITAMINB12:2,FOLATE:2,FERRITIN:2,TIBC:2,IRON:2,RETICCTPCT:2 in the last 168 hours  Micro Results: Recent Results (from the past 240 hour(s))  URINE CULTURE     Status: Normal (Preliminary result)   Collection Time   08/25/12 12:14 PM      Component Value Range Status Comment   Specimen Description URINE, CATHETERIZED   Final    Special Requests NONE   Final    Culture  Setup Time 08/25/2012 14:30   Final    Colony Count 50,000 COLONIES/ML   Final    Culture GRAM POSITIVE COCCI   Final    Report Status PENDING   Incomplete     Studies/Results: Dg Chest Portable 1 View  08/25/2012  *RADIOLOGY REPORT*  Clinical Data: Loss of consciousness, hypertension, dementia  PORTABLE CHEST - 1 VIEW  Comparison: 10/09/2010; 09/21/2010; chest CT - 09/22/2010  Findings:  Grossly unchanged cardiac silhouette and mediastinal contours with mild tortuosity the thoracic aorta.  The lungs remain hyperexpanded with flattening of bilateral hemidiaphragms. Grossly unchanged left basilar heterogeneous opacities favored to represent atelectasis or scar.  No new focal airspace opacities.  No definite pleural effusion or pneumothorax. Grossly unchanged bones.  IMPRESSION: 1.  Hyperexpanded lungs without definite acute cardiopulmonary disease. 2.  Chronic left basilar opacities favored to represent atelectasis or scar.   Original Report Authenticated By: Waynard Reeds, M.D.     Medications: I have reviewed the patient's current medications. Scheduled Meds:    . buPROPion  100 mg Oral Daily  . cefTRIAXone (ROCEPHIN)  IV  1 g  Intravenous Q24H  . digoxin  125 mcg Oral Daily  . docusate sodium  100 mg Oral QPM  . enoxaparin (LOVENOX) injection  30 mg Subcutaneous Q24H  . feeding supplement  237 mL Oral BID BM  . ferrous sulfate  325 mg Oral BID  . levothyroxine  50 mcg Oral Daily  . senna  1 tablet Oral QPM  . sertraline  25 mg Oral Daily  . sodium chloride  3 mL Intravenous Q12H   Continuous Infusions:  PRN Meds:.ondansetron (ZOFRAN) IV, ondansetron  Assessment/Plan: Syncope  Likely secondary to orthostatics in the setting of bradycardia.  Restart metoprolol at lower dose. Continue to hold Lasix.  TSH normal.  Saline lock fluids.  Recheck orthostatics tomorrow.  Patient had a 2-D echocardiogram in February of 2013 which showed cavity size was normal, systolic function was normal, ejection fraction 50-55%, regional wall motion abnormalities cannot be excluded.  Do not see the need to repeat echocardiogram at this time.  Continue on telemetry.  Atrial fibrillation  Rate controlled.  Continue digoxin.  Restart metoprolol at lower dose. Metoprolol due to bradycardia initially.  HTN (hypertension)  Hypotensive on admission, restart metoprolol. Hold furosemide.  Dementia  Stable.   UTI (lower urinary tract infection)  Continue ceftriaxone.  Antibiotics since 08/25/2012.  Urine culture from 08/25/2012 growing 50,000 pending.  Hypothyroidism  TSH normal.  Continue levothyroxine.  Thrombocytopenia Appears to be chronic.  Code Status: Full  Family Communication: Pt at bedside  Disposition Plan: DC to SNF or ALF tomorrow if bed is available.    LOS: 2 days  Coti Burd A, MD 08/27/2012, 10:17 AM

## 2012-08-27 NOTE — Progress Notes (Signed)
When pt up to [facility] and chair HR increased to 130's to 140 's still At Fib  BP 130/85 DR Betti Cruz aware. Marisa Cyphers RN

## 2012-08-27 NOTE — ED Provider Notes (Signed)
Medical screening examination/treatment/procedure(s) were conducted as a shared visit with non-physician practitioner(s) and myself.  I personally evaluated the patient during the encounter  Pt with syncope.   Stable at this time.  Will admit for monitoring and observation.  Celene Kras, MD 08/27/12 [phone]

## 2012-08-27 NOTE — Plan of Care (Signed)
Problem: Phase II Progression Outcomes Goal: Progress activity as tolerated unless otherwise ordered Outcome: Progressing Pt having increased anxiety when transferring from bed to bedside commode. She said she is scared of falling.

## 2012-08-28 LAB — BASIC METABOLIC PANEL
BUN: 13 mg/dL (ref 6–23)
CO2: 28 mEq/L (ref 19–32)
Calcium: 9.3 mg/dL (ref 8.4–10.5)
Creatinine, Ser: 0.69 mg/dL (ref 0.50–1.10)
Glucose, Bld: 83 mg/dL (ref 70–99)

## 2012-08-28 LAB — URINE CULTURE

## 2012-08-28 MED ORDER — ENSURE COMPLETE PO LIQD: 237.0000 mL | Freq: Two times a day (BID) | ORAL | Status: DC

## 2012-08-28 MED ORDER — FUROSEMIDE 20 MG PO TABS: 20.0000 mg | ORAL_TABLET | ORAL | Status: DC

## 2012-08-28 MED ORDER — METOPROLOL TARTRATE 12.5 MG HALF TABLET: 12.5000 mg | ORAL_TABLET | Freq: Two times a day (BID) | ORAL | Status: DC

## 2012-08-28 NOTE — Plan of Care (Signed)
Problem: Discharge Progression Outcomes Goal: Discharge plan in place and appropriate Outcome: Completed/Met Date Met:  08/28/12 pts plan for D/C is to go to [facility], pt left via ambulance, d/c instructions given to ambulance driver, pt a/c, pt stable upon d/c Goal: Pain controlled with appropriate interventions Outcome: Completed/Met Date Met:  08/28/12 Pt has had no c/o pain, goal met Goal: Hemodynamically stable Outcome: Completed/Met Date Met:  08/28/12 Pt hemodynamically stable, pt ready for d/c Goal: Tolerating diet Outcome: Completed/Met Date Met:  08/28/12 Pt on heart healthy diet, no c/o n/v, pt tolerating diet well goal met Goal: Activity appropriate for discharge plan Outcome: Completed/Met Date Met:  08/28/12 Pt oob with assist x 1 to bsc, pt appropriate for d/c goal met Goal: Temperature < 100 x 24 hrs on PO antibiotics Outcome: Completed/Met Date Met:  08/28/12 Pt vss, pt afebrile, abx as ordered, goal met

## 2012-08-28 NOTE — Progress Notes (Signed)
Clinical social worker assisted with patient discharge to skilled nursing facility, [facility].  CSW addressed all family questions and concerns. CSW copied chart and added all important documents. CSW also set up patient transportation with [facility]. Clinical Social Worker will sign off for now as social work intervention is no longer needed.  Charm Stenner, MSW, LCSWA [phone] 

## 2012-08-28 NOTE — Discharge Summary (Signed)
Physician Discharge Summary  [name]:[id number] DOB: Apr 19, 1922 DOA: 08/25/2012  PCP: [name], MD  Admit date: 08/25/2012 Discharge date: 08/28/2012  Recommendations for Outpatient Follow-up:  Followup with [name], MD (PCP) in 1 week. Please have CBC and BMET checked in 1 week.  Discharge Diagnoses:  Active Problems:  Atrial fibrillation  HTN (hypertension)  Encounter for long-term (current) use of anticoagulants  Dementia  Syncope and collapse  UTI (lower urinary tract infection)  Hypothyroidism  Thrombocytopenia  Orthostatic hypotension   Discharge Condition: Stable  Diet recommendation: Heart healthy diet  Filed Weights   08/26/12 0700 08/27/12 0445 08/28/12 0530  Weight: 45.8 kg (100 lb 15.5 oz) 46.2 kg (101 lb 13.6 oz) 46 kg (101 lb 6.6 oz)    History of present illness:  78 yo female who comes in to [facility] after an episode of "passing out" earlier today while she was in the bathroom. Her caregiver found her on the floor unresponsive and pt apparently returned to baseline after 10 minutes. Pt denies any prodromal events, no chest pain or shortness of breath, no similar events in the past, no abdominal or urinary concerns, no fevers, no chills. No other systemic symptoms. She reports she feels at her baseline at the moment.  Hospital Course:  Syncope  Likely secondary to orthostatics in the setting of bradycardia. Restarted metoprolol at lower dose, heart rate under control. Restarted Lasix as M, W, F (home dose daily except Saturday and Monday) at discharge. TSH normal. Initially hydrated the patient on IVF, restarted lasix at discharge. Patient had a 2-D echocardiogram in February of 2013 which showed cavity size was normal, systolic function was normal, ejection fraction 50-55%, regional wall motion abnormalities cannot be excluded. Do not see the need to repeat echocardiogram at this time. No events on telemetry.   Atrial fibrillation  Rate controlled.  Continue digoxin. Restarted metoprolol at lower dose. Metoprolol held due to bradycardia initially.   HTN (hypertension)  Hypotensive on admission resolved. Back on metoprolol and furosemide at discharge.   Dementia  Stable.   UTI (lower urinary tract infection)  Given patient is asymptomatic, discussed with ID Dr. Luciana Axe, would not treat enterococcus sensitive only to vancomycin. Antibiotics since 08/25/2012. Urine culture from 08/25/2012 growing 50,000 colonies of enterococcus.   Hypothyroidism  TSH normal. Continue levothyroxine.   Thrombocytopenia  Appears to be chronic.   Code Status: Full, discussed with son prior to discharge.   Procedures:  As above.  Consultations:  None.  Discharge Exam: Filed Vitals:   08/27/12 1641 08/27/12 2025 08/27/12 2028 08/28/12 0530  BP: 130/82 139/103 149/75 133/67  Pulse: 98 77 90 94  Temp:  97.9 F (36.6 C)  98 F (36.7 C)  TempSrc:  Oral  Oral  Resp:  20  20  Height:      Weight:    46 kg (101 lb 6.6 oz)  SpO2:  100%  96%   Discharge Instructions  Discharge Orders    Future Orders Please Complete By Expires   Diet - low sodium heart healthy      Increase activity slowly      Discharge instructions      Comments:   Followup with [name], MD (PCP) in 1 week. Please have CBC and BMET checked in 1 week.       Medication List     As of 08/28/2012 11:45 AM    TAKE these medications         buPROPion 100  MG tablet   Commonly known as: WELLBUTRIN   Take 100 mg by mouth daily.      cholecalciferol 400 UNITS Tabs   Commonly known as: VITAMIN D   Take 400 Units by mouth 2 (two) times daily.      digoxin 0.125 MG tablet   Commonly known as: LANOXIN   Take 125 mcg by mouth daily.      docusate sodium 100 MG capsule   Commonly known as: COLACE   Take 100 mg by mouth every evening.      feeding supplement Liqd   Take 237 mLs by mouth 2 (two) times daily between meals.      ferrous sulfate 325 (65 FE) MG tablet    Take 325 mg by mouth 2 (two) times daily.      furosemide 20 MG tablet   Commonly known as: LASIX   Take 1 tablet (20 mg total) by mouth every Monday, Wednesday, and Friday. Pt takes 1 tab every day except Saturday and Monday      levothyroxine 50 MCG tablet   Commonly known as: SYNTHROID, LEVOTHROID   Take 1 tablet (50 mcg total) by mouth daily. 6 days a week, not on Sundays      metoprolol tartrate 12.5 mg Tabs   Commonly known as: LOPRESSOR   Take 0.5 tablets (12.5 mg total) by mouth 2 (two) times daily.      multivitamin tablet   Take 1 tablet by mouth daily.      OYSTER SHELL CALCIUM 500 + D PO   Take 1 tablet by mouth daily.      senna 8.6 MG Tabs   Commonly known as: SENOKOT   Take 1 tablet by mouth every evening.      sertraline 25 MG tablet   Commonly known as: ZOLOFT   Take 25 mg by mouth daily.           Follow-up Information    Follow up with [name], MD. In 1 week. (Will need CBC and BMET in 1 week.)    Contact information:   [address] [name] [address] [phone]           The results of significant diagnostics from this hospitalization (including imaging, microbiology, ancillary and laboratory) are listed below for reference.    Significant Diagnostic Studies: Dg Chest Portable 1 View  08/25/2012  *RADIOLOGY REPORT*  Clinical Data: Loss of consciousness, hypertension, dementia  PORTABLE CHEST - 1 VIEW  Comparison: 10/09/2010; 09/21/2010; chest CT - 09/22/2010  Findings:  Grossly unchanged cardiac silhouette and mediastinal contours with mild tortuosity the thoracic aorta.  The lungs remain hyperexpanded with flattening of bilateral hemidiaphragms. Grossly unchanged left basilar heterogeneous opacities favored to represent atelectasis or scar.  No new focal airspace opacities.  No definite pleural effusion or pneumothorax. Grossly unchanged bones.  IMPRESSION: 1.  Hyperexpanded lungs without definite acute cardiopulmonary  disease. 2.  Chronic left basilar opacities favored to represent atelectasis or scar.   Original Report Authenticated By: Waynard Reeds, M.D.     Microbiology: Recent Results (from the past 240 hour(s))  URINE CULTURE     Status: Normal   Collection Time   08/25/12 12:14 PM      Component Value Range Status Comment   Specimen Description URINE, CATHETERIZED   Final    Special Requests NONE   Final    Culture  Setup Time 08/25/2012 14:30   Final    Colony  Count 50,000 COLONIES/ML   Final    Culture ENTEROCOCCUS SPECIES   Final    Report Status 08/28/2012 FINAL   Final    Organism ID, Bacteria ENTEROCOCCUS SPECIES   Final      Labs: Basic Metabolic Panel:  Lab 08/28/12 4098 08/27/12 0640 08/26/12 0250 08/25/12 1629 08/25/12 1120  NA 141 145 143 -- 140  K 3.6 3.5 3.9 -- 3.9  CL 106 108 108 -- 100  CO2 28 26 28  -- 33*  GLUCOSE 83 91 98 -- 93  BUN 13 11 13  -- 17  CREATININE 0.69 0.73 0.78 -- 1.02  CALCIUM 9.3 9.5 9.2 -- 10.1  MG -- -- -- 2.3 --  PHOS -- -- -- 2.8 --   Liver Function Tests:  Lab 08/25/12 1120  AST 19  ALT 10  ALKPHOS 85  BILITOT 0.4  PROT 6.6  ALBUMIN 3.5   No results found for this basename: LIPASE:5,AMYLASE:5 in the last 168 hours No results found for this basename: AMMONIA:5 in the last 168 hours CBC:  Lab 08/27/12 0640 08/26/12 0250 08/25/12 1120  WBC 3.3* 3.3* 4.7  NEUTROABS -- -- 2.9  HGB 13.5 12.9 13.7  HCT 40.0 37.8 41.5  MCV 90.9 89.6 90.6  PLT 82* 84* 86*   Cardiac Enzymes:  Lab 08/26/12 0250 08/25/12 2201 08/25/12 1629 08/25/12 1130  CKTOTAL -- -- -- --  CKMB -- -- -- --  CKMBINDEX -- -- -- --  TROPONINI <0.30 <0.30 <0.30 <0.30   BNP: BNP (last 3 results) No results found for this basename: PROBNP:3 in the last 8760 hours CBG:  Lab 08/28/12 0602 08/27/12 0621 08/26/12 0755  GLUCAP 85 87 90    Time coordinating discharge: 25 minutes  Signed:  Racer Quam A  [facility] 08/28/2012, 11:45 AM

## 2012-08-28 NOTE — Progress Notes (Signed)
Physical Therapy Treatment Patient Details Name: [name] MRN: [id number] DOB: Nov 15, 1922 Today's Date: 08/28/2012 Time: 9528-4132 PT Time Calculation (min): 28 min  PT Assessment / Plan / Recommendation Comments on Treatment Session  Pt with c/o "blackness" when ambulating today.   BP was monitored with positional changes.      Follow Up Recommendations  No PT follow up;Supervision/Assistance - 24 hour (daily mobility with nursing staff)    Barriers to Discharge        Equipment Recommendations  None recommended by PT    Recommendations for Other Services    Frequency Min 2X/week   Plan Discharge plan remains appropriate    Precautions / Restrictions Restrictions Weight Bearing Restrictions: No    Pertinent Vitals/Pain While ambulating pt with c/o "i'm going to pass out, it's getting black".  BP 144/94 sitting, 143/73 standing, & 141/78 during stand-pivot transfer with same c/o "it's getting black, again".      Mobility  Bed Mobility Bed Mobility: Supine to Sit;Sitting - Scoot to Edge of Bed Supine to Sit: 4: Min guard;HOB flat;With rails Sitting - Scoot to Edge of Bed: 4: Min guard Details for Bed Mobility Assistance: Cues for sequencing & technique.   Transfers Transfers: Sit to Stand;Stand to Sit Sit to Stand: 4: Min assist;With upper extremity assist;With armrests;From bed;From chair/3-in-1 Stand to Sit: 4: Min assist;With upper extremity assist;With armrests;To bed;To chair/3-in-1 Details for Transfer Assistance: Cues for hand placement & technique.  Assist to achieve standing, anterior translation of trunk over BOS, balance, & controlled descent.   Ambulation/Gait Ambulation/Gait Assistance: 4: Min assist Ambulation Distance (Feet): 25 Feet Assistive device: Rolling walker Ambulation/Gait Assistance Details: Pt c/o "I'm going to pass out, It's getting black".  Assisted pt to sitting & BP monitored.  BP 144/94 after 1 min sitting.   143/74 in standing.  141/78  after stand-pivot transfer & pt reporting of "it's getting black" again.   Gait Pattern: Step-through pattern;Shuffle;Trunk flexed;Narrow base of support Gait velocity: decreased Stairs: No Wheelchair Mobility Wheelchair Mobility: No    Exercises General Exercises - Lower Extremity Ankle Circles/Pumps: AROM;Both;15 reps Long Arc Quad: AROM;Both;10 reps Heel Slides: AROM;Both;10 reps Straight Leg Raises: AROM;Both;10 reps Hip Flexion/Marching: AROM;Both;10 reps Toe Raises: AROM;Both;10 reps Heel Raises: AROM;Both;10 reps    PT Goals Acute Rehab PT Goals Time For Goal Achievement: 09/02/12 Potential to Achieve Goals: Fair Pt will go Supine/Side to Sit: with supervision;with HOB 0 degrees PT Goal: Supine/Side to Sit - Progress: Progressing toward goal Pt will go Sit to Stand: with supervision PT Goal: Sit to Stand - Progress: Not met Pt will go Stand to Sit: with supervision PT Goal: Stand to Sit - Progress: Not met Pt will Ambulate: 16 - 50 feet;with min assist;with rolling walker PT Goal: Ambulate - Progress: Progressing toward goal  Visit Information  Last PT Received On: 08/28/12 Assistance Needed: +1    Subjective Data  Subjective: "This is all a suprise to me"   Cognition  Overall Cognitive Status: History of cognitive impairments - at baseline Arousal/Alertness: Awake/alert Orientation Level: Disoriented to;Time;Place;Situation Behavior During Session: WFL for tasks performed Cognition - Other Comments: pt with very limited STM    Balance  Static Sitting Balance Static Sitting - Balance Support: Feet supported Static Sitting - Level of Assistance: 6: Modified independent (Device/Increase time) Static Standing Balance Static Standing - Balance Support: Bilateral upper extremity supported Static Standing - Level of Assistance: 4: Min assist Static Standing - Comment/# of Minutes: pt with posterior lean.  End of Session PT - End of Session Equipment Utilized  During Treatment: Gait belt Activity Tolerance: Patient tolerated treatment well Patient left: in chair;with call bell/phone within reach;with chair alarm set    Verdell Face, PTA [phone] 08/28/2012

## 2012-08-28 NOTE — Progress Notes (Signed)
Subjective: No specific complaints. Does not recall seeing me for the last 3 days. Was surprised when I indicated that she was in the hospital for the last 3 days.  Objective: Vital signs in last 24 hours: Filed Vitals:   08/27/12 1641 08/27/12 2025 08/27/12 2028 08/28/12 0530  BP: 130/82 139/103 149/75 133/67  Pulse: 98 77 90 94  Temp:  97.9 F (36.6 C)  98 F (36.7 C)  TempSrc:  Oral  Oral  Resp:  20  20  Height:      Weight:    46 kg (101 lb 6.6 oz)  SpO2:  100%  96%   Weight change: -0.2 kg (-7.1 oz)  Intake/Output Summary (Last 24 hours) at 08/28/12 0905 Last data filed at 08/28/12 0850  Gross per 24 hour  Intake    780 ml  Output    300 ml  Net    480 ml    Physical Exam: General: Awake, No acute distress. HEENT: EOMI. Neck: Supple CV: S1 and S2 Lungs: Clear to ascultation bilaterally Abdomen: Soft, Nontender, Nondistended, +bowel sounds. Ext: Good pulses. Trace edema.  Lab Results: Basic Metabolic Panel:  Lab 08/28/12 1610 08/27/12 0640 08/26/12 0250 08/25/12 1629 08/25/12 1120  NA 141 145 143 -- 140  K 3.6 3.5 3.9 -- 3.9  CL 106 108 108 -- 100  CO2 28 26 28  -- 33*  GLUCOSE 83 91 98 -- 93  BUN 13 11 13  -- 17  CREATININE 0.69 0.73 0.78 -- 1.02  CALCIUM 9.3 9.5 9.2 -- 10.1  MG -- -- -- 2.3 --  PHOS -- -- -- 2.8 --   Liver Function Tests:  Lab 08/25/12 1120  AST 19  ALT 10  ALKPHOS 85  BILITOT 0.4  PROT 6.6  ALBUMIN 3.5   No results found for this basename: LIPASE:5,AMYLASE:5 in the last 168 hours No results found for this basename: AMMONIA:5 in the last 168 hours CBC:  Lab 08/27/12 0640 08/26/12 0250 08/25/12 1120  WBC 3.3* 3.3* 4.7  NEUTROABS -- -- 2.9  HGB 13.5 12.9 13.7  HCT 40.0 37.8 41.5  MCV 90.9 89.6 90.6  PLT 82* 84* 86*   Cardiac Enzymes:  Lab 08/26/12 0250 08/25/12 2201 08/25/12 1629 08/25/12 1130  CKTOTAL -- -- -- --  CKMB -- -- -- --  CKMBINDEX -- -- -- --  TROPONINI <0.30 <0.30 <0.30 <0.30   BNP (last 3 results) No  results found for this basename: PROBNP:3 in the last 8760 hours CBG:  Lab 08/27/12 0621 08/26/12 0755  GLUCAP 87 90   No results found for this basename: HGBA1C:5 in the last 72 hours Other Labs: No components found with this basename: POCBNP:3 No results found for this basename: DDIMER:2 in the last 168 hours No results found for this basename: CHOL:2,HDL:2,LDLCALC:2,TRIG:2,CHOLHDL:2,LDLDIRECT:2 in the last 168 hours  Lab 08/25/12 1629  TSH 1.052  T4TOTAL --  T3FREE --  FREET4 --  THYROIDAB --   No results found for this basename: VITAMINB12:2,FOLATE:2,FERRITIN:2,TIBC:2,IRON:2,RETICCTPCT:2 in the last 168 hours  Micro Results: Recent Results (from the past 240 hour(s))  URINE CULTURE     Status: Normal (Preliminary result)   Collection Time   08/25/12 12:14 PM      Component Value Range Status Comment   Specimen Description URINE, CATHETERIZED   Final    Special Requests NONE   Final    Culture  Setup Time 08/25/2012 14:30   Final    Colony Count 50,000 COLONIES/ML   Final  Culture GRAM POSITIVE COCCI   Final    Report Status PENDING   Incomplete     Studies/Results: No results found.  Medications: I have reviewed the patient's current medications. Scheduled Meds:    . buPROPion  100 mg Oral Daily  . cefTRIAXone (ROCEPHIN)  IV  1 g Intravenous Q24H  . digoxin  125 mcg Oral Daily  . docusate sodium  100 mg Oral QPM  . enoxaparin (LOVENOX) injection  30 mg Subcutaneous Q24H  . feeding supplement  237 mL Oral BID BM  . ferrous sulfate  325 mg Oral BID  . levothyroxine  50 mcg Oral Daily  . metoprolol tartrate  12.5 mg Oral BID  . senna  1 tablet Oral QPM  . sertraline  25 mg Oral Daily  . sodium chloride  3 mL Intravenous Q12H   Continuous Infusions:  PRN Meds:.ondansetron (ZOFRAN) IV, ondansetron  Assessment/Plan: Syncope  Likely secondary to orthostatics in the setting of bradycardia.  Restarted metoprolol at lower dose, heart rate under control. Restart  Lasix as M, W, F (home dose daily except Saturday and Monday).  TSH normal.  Initially hydrated the patient on IVF.  Patient had a 2-D echocardiogram in February of 2013 which showed cavity size was normal, systolic function was normal, ejection fraction 50-55%, regional wall motion abnormalities cannot be excluded.  Do not see the need to repeat echocardiogram at this time.  No events on telemetry.  Atrial fibrillation  Rate controlled.  Continue digoxin.  Restarted metoprolol at lower dose. Metoprolol held due to bradycardia initially.  HTN (hypertension)  Hypotensive on admission resolved. Back on metoprolol and furosemide at discharge.  Dementia  Stable.   UTI (lower urinary tract infection)  Given patient is asymptomatic, discussed with ID Dr. Luciana Axe, would not treat enterococcus sensitive only to vancomycin.  Antibiotics since 08/25/2012.  Urine culture from 08/25/2012 growing 50,000 colonies of enterococcus.  Hypothyroidism  TSH normal.  Continue levothyroxine.  Thrombocytopenia Appears to be chronic.  Code Status: Full, discussed with son prior to discharge, he reports he has been having difficulty making the decision about her code status. Family Communication: Pt at bedside  Disposition Plan: DC to SNF or ALF today.   LOS: 3 days  Petrea Fredenburg A, MD 08/28/2012, 9:05 AM

## 2012-09-02 ENCOUNTER — Encounter: Payer: Self-pay | Admitting: *Deleted

## 2012-12-06 ENCOUNTER — Emergency Department (HOSPITAL_COMMUNITY): Payer: Medicare Other

## 2012-12-06 ENCOUNTER — Encounter (HOSPITAL_COMMUNITY): Payer: Self-pay | Admitting: Emergency Medicine

## 2012-12-06 ENCOUNTER — Inpatient Hospital Stay (HOSPITAL_COMMUNITY)
Admission: EM | Admit: 2012-12-06 | Discharge: 2012-12-09 | DRG: 312 | Disposition: A | Discharge status: Home / Self Care | Payer: Medicare Other | Attending: Internal Medicine | Admitting: Internal Medicine

## 2012-12-06 DIAGNOSIS — F411 Generalized anxiety disorder: Secondary | ICD-10-CM | POA: Diagnosis present

## 2012-12-06 DIAGNOSIS — R55 Syncope and collapse: Secondary | ICD-10-CM

## 2012-12-06 DIAGNOSIS — N390 Urinary tract infection, site not specified: Secondary | ICD-10-CM

## 2012-12-06 DIAGNOSIS — S72002A Fracture of unspecified part of neck of left femur, initial encounter for closed fracture: Secondary | ICD-10-CM

## 2012-12-06 DIAGNOSIS — I951 Orthostatic hypotension: Principal | ICD-10-CM

## 2012-12-06 DIAGNOSIS — Z9089 Acquired absence of other organs: Secondary | ICD-10-CM

## 2012-12-06 DIAGNOSIS — Z7902 Long term (current) use of antithrombotics/antiplatelets: Secondary | ICD-10-CM

## 2012-12-06 DIAGNOSIS — M4800 Spinal stenosis, site unspecified: Secondary | ICD-10-CM

## 2012-12-06 DIAGNOSIS — M6282 Rhabdomyolysis: Secondary | ICD-10-CM

## 2012-12-06 DIAGNOSIS — I509 Heart failure, unspecified: Secondary | ICD-10-CM | POA: Diagnosis present

## 2012-12-06 DIAGNOSIS — F0390 Unspecified dementia without behavioral disturbance: Secondary | ICD-10-CM

## 2012-12-06 DIAGNOSIS — Z7901 Long term (current) use of anticoagulants: Secondary | ICD-10-CM

## 2012-12-06 DIAGNOSIS — Z888 Allergy status to other drugs, medicaments and biological substances status: Secondary | ICD-10-CM

## 2012-12-06 DIAGNOSIS — D696 Thrombocytopenia, unspecified: Secondary | ICD-10-CM

## 2012-12-06 DIAGNOSIS — I252 Old myocardial infarction: Secondary | ICD-10-CM

## 2012-12-06 DIAGNOSIS — I10 Essential (primary) hypertension: Secondary | ICD-10-CM

## 2012-12-06 DIAGNOSIS — Z79899 Other long term (current) drug therapy: Secondary | ICD-10-CM

## 2012-12-06 DIAGNOSIS — D649 Anemia, unspecified: Secondary | ICD-10-CM

## 2012-12-06 DIAGNOSIS — I4891 Unspecified atrial fibrillation: Secondary | ICD-10-CM

## 2012-12-06 DIAGNOSIS — Z8701 Personal history of pneumonia (recurrent): Secondary | ICD-10-CM

## 2012-12-06 DIAGNOSIS — Z23 Encounter for immunization: Secondary | ICD-10-CM

## 2012-12-06 DIAGNOSIS — R259 Unspecified abnormal involuntary movements: Secondary | ICD-10-CM | POA: Diagnosis present

## 2012-12-06 DIAGNOSIS — M810 Age-related osteoporosis without current pathological fracture: Secondary | ICD-10-CM

## 2012-12-06 DIAGNOSIS — M129 Arthropathy, unspecified: Secondary | ICD-10-CM | POA: Diagnosis present

## 2012-12-06 DIAGNOSIS — Z96649 Presence of unspecified artificial hip joint: Secondary | ICD-10-CM

## 2012-12-06 DIAGNOSIS — E039 Hypothyroidism, unspecified: Secondary | ICD-10-CM

## 2012-12-06 DIAGNOSIS — R251 Tremor, unspecified: Secondary | ICD-10-CM

## 2012-12-06 DIAGNOSIS — S72143A Displaced intertrochanteric fracture of unspecified femur, initial encounter for closed fracture: Secondary | ICD-10-CM

## 2012-12-06 LAB — COMPREHENSIVE METABOLIC PANEL
Albumin: 3.5 g/dL (ref 3.5–5.2)
BUN: 16 mg/dL (ref 6–23)
Chloride: 103 mEq/L (ref 96–112)
Creatinine, Ser: 1 mg/dL (ref 0.50–1.10)
GFR calc Af Amer: 57 mL/min — ABNORMAL LOW (ref >90)
Glucose, Bld: 96 mg/dL (ref 70–99)
Total Bilirubin: 0.4 mg/dL (ref 0.3–1.2)
Total Protein: 6.1 g/dL (ref 6.0–8.3)

## 2012-12-06 LAB — GLUCOSE, CAPILLARY: Glucose-Capillary: 109 mg/dL — ABNORMAL HIGH (ref 70–99)

## 2012-12-06 LAB — URINALYSIS, ROUTINE W REFLEX MICROSCOPIC
Glucose, UA: NEGATIVE mg/dL
Nitrite: NEGATIVE
Protein, ur: 100 mg/dL — AB

## 2012-12-06 LAB — URINE MICROSCOPIC-ADD ON

## 2012-12-06 LAB — DIGOXIN LEVEL: Digoxin Level: 0.7 ng/mL — ABNORMAL LOW (ref 0.8–2.0)

## 2012-12-06 LAB — CBC
HCT: 41.8 % (ref 36.0–46.0)
MCHC: 33.3 g/dL (ref 30.0–36.0)
MCV: 92.3 fL (ref 78.0–100.0)
RDW: 13.3 % (ref 11.5–15.5)

## 2012-12-06 LAB — PHOSPHORUS: Phosphorus: 3.2 mg/dL (ref 2.3–4.6)

## 2012-12-06 LAB — TROPONIN I: Troponin I: <0.3 ng/mL (ref <0.30)

## 2012-12-06 MED ORDER — SODIUM CHLORIDE 0.9 % IV BOLUS (SEPSIS)
1000.0000 mL | Freq: Once | INTRAVENOUS | Status: AC
  Administered 2012-12-06: 1000 mL via INTRAVENOUS

## 2012-12-06 MED ORDER — BUPROPION HCL 100 MG PO TABS
100.0000 mg | ORAL_TABLET | Freq: Every morning | ORAL | Status: DC
  Administered 2012-12-07 – 2012-12-09 (×3): 100 mg via ORAL
  Filled 2012-12-06 (×3): qty 1

## 2012-12-06 MED ORDER — CHOLECALCIFEROL 10 MCG (400 UNIT) PO TABS
400.0000 [IU] | ORAL_TABLET | Freq: Two times a day (BID) | ORAL | Status: DC
  Administered 2012-12-06 – 2012-12-09 (×6): 400 [IU] via ORAL
  Filled 2012-12-06 (×7): qty 1

## 2012-12-06 MED ORDER — SODIUM CHLORIDE 0.9 % IV BOLUS (SEPSIS)
500.0000 mL | Freq: Once | INTRAVENOUS | Status: AC
  Administered 2012-12-06: 500 mL via INTRAVENOUS

## 2012-12-06 MED ORDER — FERROUS SULFATE 325 (65 FE) MG PO TABS
325.0000 mg | ORAL_TABLET | Freq: Two times a day (BID) | ORAL | Status: DC
  Administered 2012-12-06 – 2012-12-09 (×6): 325 mg via ORAL
  Filled 2012-12-06 (×7): qty 1

## 2012-12-06 MED ORDER — DEXTROSE 5 % IV SOLN
1.0000 g | INTRAVENOUS | Status: DC
  Administered 2012-12-07 – 2012-12-08 (×2): 1 g via INTRAVENOUS
  Filled 2012-12-06 (×3): qty 10

## 2012-12-06 MED ORDER — DEXTROSE 5 % IV SOLN
1.0000 g | Freq: Once | INTRAVENOUS | Status: AC
  Administered 2012-12-06: 1 g via INTRAVENOUS
  Filled 2012-12-06: qty 10

## 2012-12-06 MED ORDER — LEVOTHYROXINE SODIUM 50 MCG PO TABS
50.0000 ug | ORAL_TABLET | Freq: Every day | ORAL | Status: DC
  Administered 2012-12-07 – 2012-12-09 (×3): 50 ug via ORAL
  Filled 2012-12-06 (×4): qty 1

## 2012-12-06 MED ORDER — METOPROLOL TARTRATE 12.5 MG HALF TABLET
12.5000 mg | ORAL_TABLET | Freq: Two times a day (BID) | ORAL | Status: DC
  Administered 2012-12-06 – 2012-12-09 (×6): 12.5 mg via ORAL
  Filled 2012-12-06 (×7): qty 1

## 2012-12-06 MED ORDER — SODIUM CHLORIDE 0.9 % IV SOLN
INTRAVENOUS | Status: DC
  Administered 2012-12-07: 08:00:00 via INTRAVENOUS

## 2012-12-06 MED ORDER — SERTRALINE HCL 25 MG PO TABS
25.0000 mg | ORAL_TABLET | Freq: Every morning | ORAL | Status: DC
  Administered 2012-12-07 – 2012-12-09 (×3): 25 mg via ORAL
  Filled 2012-12-06 (×3): qty 1

## 2012-12-06 MED ORDER — ENSURE COMPLETE PO LIQD
237.0000 mL | Freq: Two times a day (BID) | ORAL | Status: DC
  Administered 2012-12-07 – 2012-12-09 (×5): 237 mL via ORAL

## 2012-12-06 MED ORDER — SODIUM CHLORIDE 0.9 % IJ SOLN: 3.0000 mL | Freq: Two times a day (BID) | INTRAMUSCULAR | Status: DC

## 2012-12-06 MED ORDER — DIGOXIN 125 MCG PO TABS
125.0000 ug | ORAL_TABLET | Freq: Every morning | ORAL | Status: DC
  Administered 2012-12-07 – 2012-12-09 (×3): 125 ug via ORAL
  Filled 2012-12-06 (×3): qty 1

## 2012-12-06 NOTE — ED Notes (Signed)
Patient transported to CT 

## 2012-12-06 NOTE — H&P (Addendum)
[facility] History and Physical  [name] WUJ:[id number] DOB: [birth year] DOA: 12/06/2012  Referring physician: Dr. Karma Ganja PCP: [name], MD  Specialists: none  Chief Complaint: syncope  HPI: [name] is a 79 y.o. female  With history of Atrial fibrillation (metoprolol and digoxin), CHF (metoprolol and lasix), and dementia who has had prior admission ~ 4 months ago for orthostatic hypotension.  She presents to the ED after "passing out" for approximately 40 minutes while she was sitting on the commode.  No seizure like activity reported. No bladder and bowel incontinence.  Patient reportedly came back to consciousness without intervention and reportedly patient had palor associated with the event.  Denies any recent fevers, chills, abdominal pain, nausea.  Family reports that patient take lasix regularly but reports that patient has a hard time drinking fluids and has always had this problem.  In the ED patient was found to have a blood pressure of 85/63 which improved with normal saline administration and reportedly when they tried to ambulate patient her blood pressures were significantly lower.   We were subsequently called for further admission orders.  Review of Systems: The patient denies anorexia, fever, weight loss,, vision loss, decreased hearing, hoarseness, chest pain, syncope, dyspnea on exertion, peripheral edema, balance deficits, hemoptysis, abdominal pain, melena, hematochezia, severe indigestion/heartburn, hematuria, incontinence, genital sores, muscle weakness, suspicious skin lesions, transient blindness, difficulty walking, depression, unusual weight change, abnormal bleeding, enlarged lymph nodes, angioedema, and breast masses.    Past Medical History  Diagnosis Date  . Anxiety   . HTN (hypertension)   . Atrial fibrillation   . Dementia   . Tremor   . Osteoporosis     prev intolerant of alendronate  . Myocardial infarction   . Anemia   .  Hypertension   . Closed left hip fracture   . Arthritis   . DEMENTIA   . Pneumonia   . Hypothyroid   . Hypothyroidism   . Depression    Past Surgical History  Procedure Date  . Head surgery     for trigeminal neuralgia  . Total hip arthroplasty     right  . Cataract extraction   . Appendectomy   . Cataract extraction, bilateral   . Rotator cuff repair     left  . Closed reduction humerus fracture     left   Social History:  reports that she has never smoked. She has never used smokeless tobacco. She reports that she does not drink alcohol or use illicit drugs. Patient lives with son  Can patient participate in ADLs? limited  Allergies  Allergen Reactions  . Alendronate Sodium     unknown  . Aricept (Donepezil Hydrochloride)     unknown  . Warfarin Sodium     Held after fall 12/2011    Family History  Problem Relation Age of Onset  . Cancer    . Cancer Sister     breast cancer  . Cancer Brother     lung cancer  history of parkinsons disease  Prior to Admission medications   Medication Sig Start Date End Date Taking? Authorizing Provider  buPROPion (WELLBUTRIN) 100 MG tablet Take 100 mg by mouth every morning.    Yes Historical Provider, MD  Calcium Carbonate-Vitamin D (OYSTER SHELL CALCIUM 500 + D PO) Take 1 tablet by mouth every evening.    Yes Historical Provider, MD  cholecalciferol (VITAMIN D) 400 UNITS TABS Take 400 Units by mouth 2 (two) times daily.  Yes Historical Provider, MD  digoxin (LANOXIN) 0.125 MG tablet Take 125 mcg by mouth every morning.    Yes Historical Provider, MD  docusate sodium (COLACE) 100 MG capsule Take 100 mg by mouth every evening.   Yes Historical Provider, MD  feeding supplement (ENSURE COMPLETE) LIQD Take 237 mLs by mouth 2 (two) times daily between meals. 08/28/12  Yes Cristal Ford, MD  ferrous sulfate 325 (65 FE) MG tablet Take 325 mg by mouth 2 (two) times daily.     Yes Historical Provider, MD  furosemide (LASIX) 20 MG tablet  Take 20 mg by mouth See admin instructions. Pt takes 1 tab every day except Saturday and Monday 08/28/12  Yes Srikar Cherlynn Kaiser, MD  levothyroxine (SYNTHROID, LEVOTHROID) 50 MCG tablet Take 1 tablet (50 mcg total) by mouth daily. 6 days a week, not on Sundays 08/12/12  Yes Joaquim Nam, MD  metoprolol tartrate (LOPRESSOR) 25 MG tablet Take 25 mg by mouth 2 (two) times daily.   Yes Historical Provider, MD  Multiple Vitamin (MULTIVITAMIN) tablet Take 1 tablet by mouth every morning.    Yes Historical Provider, MD  senna (SENOKOT) 8.6 MG TABS Take 1 tablet by mouth every evening.   Yes Historical Provider, MD  sertraline (ZOLOFT) 25 MG tablet Take 25 mg by mouth every morning.    Yes Historical Provider, MD   Physical Exam: Filed Vitals:   12/06/12 1128 12/06/12 1146 12/06/12 1148 12/06/12 1151  BP:  107/51 103/54 85/63  Pulse:  82 74 68  Temp:  97.8 F (36.6 C)    Resp:  18    SpO2: 98% 94%       General:  Pt in NAD, Alert and Awake, comfortable laying supine in bed  Eyes: EOMI, non icteric  ENT: normal exterior appearance, no masses on visual inspection  Neck: supple, no goiter  Cardiovascular: irregular, no murmurs rubs  Respiratory: CTA BL, no wheezes  Abdomen: soft, NT, ND  Skin: warm and dry  Musculoskeletal: no clubbing or cyanosis  Psychiatric: mood and affect appropriate  Neurologic: answers questions appropriately, moves all extremities  Labs on Admission:  Basic Metabolic Panel:  Lab 12/06/12 9604  NA 142  K 3.8  CL 103  CO2 30  GLUCOSE 96  BUN 16  CREATININE 1.00  CALCIUM 9.4  MG --  PHOS --   Liver Function Tests:  Lab 12/06/12 1255  AST 22  ALT 16  ALKPHOS 72  BILITOT 0.4  PROT 6.1  ALBUMIN 3.5   No results found for this basename: LIPASE:5,AMYLASE:5 in the last 168 hours No results found for this basename: AMMONIA:5 in the last 168 hours CBC:  Lab 12/06/12 1255  WBC 6.0  NEUTROABS --  HGB 13.9  HCT 41.8  MCV 92.3  PLT 87*    Cardiac Enzymes:  Lab 12/06/12 1255  CKTOTAL --  CKMB --  CKMBINDEX --  TROPONINI <0.30    BNP (last 3 results) No results found for this basename: PROBNP:3 in the last 8760 hours CBG:  Lab 12/06/12 1209  GLUCAP 109*    Radiological Exams on Admission: Ct Head Wo Contrast  12/06/2012  *RADIOLOGY REPORT*  Clinical Data: Loss of consciousness.  CT HEAD WITHOUT CONTRAST  Technique:  Contiguous axial images were obtained from the base of the skull through the vertex without contrast.  Comparison: Brain MRI 09/02/2006.  Findings: Mild cerebral and cerebellar atrophy.  Extensive patchy and confluent areas of decreased attenuation throughout the deep and  periventricular white matter of the cerebral hemispheres bilaterally, compatible with advanced chronic microvascular ischemic disease.  No acute intracranial abnormalities. Specifically, no definite signs of acute post-traumatic intracranial hemorrhage, no evidence of acute/subacute cerebral ischemia, no mass, mass effect, hydrocephalus or abnormal intra or extra-axial fluid collections.  Postoperative changes of right- sided occipital craniectomy are noted.  No acute displaced skull fractures are evident.  Visualized paranasal sinuses and mastoids are generally well pneumatized, with exception of some mild mucosal thickening throughout the ethmoid sinuses bilaterally.  IMPRESSION: 1.  No acute intracranial abnormalities. 2.  Cerebral and cerebellar atrophy with extensive chronic microvascular ischemic changes in the white matter, as above.   Original Report Authenticated By: Trudie Reed, M.D.     EKG: Independently reviewed. Atrial fibrillation rate controlled which is unchanged when compared to last  Assessment/Plan Principal Problem:  *Orthostatic hypotension Active Problems:  Syncope and collapse  Atrial fibrillation  HTN (hypertension)  Hypothyroidism  Thrombocytopenia   1. Orthostatic hypotension/ syncope and collapse -  Most likely etiology for syncope given above history and as such will not order EEG.  Will however order carotid dopplers.  Patient had echocardiogram in February 2013 which showed EF of 50-55% at this point will not repeat echocardiogram.  - Plan will be to hold lasix and IVF administration.  - reassess orthostatic vitals next am - physical therapy - bolus another 500 cc of normal saline  2. Atrial fibrillation - given falls agree with discontinuation of coumadin - continue digoxin - will decrease metoprolol dose and place holding parameters  3. HTN - will hold blood pressure medication - continue B blocker mainly for rate control of Afib with holding parameters  4. Hypothyroidism: - Check TSH - continue home regimen synthroid.  5. Thrombocytopenia - will monitor in house - avoid heparin for dvt prophylaxis - will recommend patient follow up with PCP as outpatient for further evaluation and recommendations.  Addendum UTI - urine culture - rocephin - may have contributed to # 1 - check wbc next am.   Code Status: full Family Communication: Spoke to sister and son at bedside Disposition Plan: Pending improvement in blood pressures after rehydration.  Time spent: > 60 minutes  Penny Pia [facility] Pager [phone]  If 7PM-7AM, please contact night-coverage [email or web address] Password [facility] 12/06/2012, 4:35 PM

## 2012-12-06 NOTE — ED Notes (Signed)
Pt returned from CT °

## 2012-12-06 NOTE — ED Notes (Signed)
Per EMS: Pt coming from home, pt had syncopal event witnessed by family. Pt was assisted to floor and family reports being unresponsive for a few seconds. Pt has no apparent injuries, however family is concerned that pt is weaker than normal. Pt has a baseline of Alzheimer's.

## 2012-12-06 NOTE — ED Provider Notes (Signed)
History     CSN: [id number]  Arrival date & time 12/06/12  1123   First MD Initiated Contact with Patient 12/06/12 1125      Chief Complaint  Patient presents with  . Loss of Consciousness    (Consider location/radiation/quality/duration/timing/severity/associated sxs/prior treatment) HPI A LEVEL 5 CAVEAT PERTAINS DUE TO DEMENTIA Pt presenting with c/o syncope.  She was seated on the commode and was found unresponsive by family.  Per report she was unconscious for several seconds.  No recent illnesses. Pt has hx of dementia and cannot recall details of the event.    Past Medical History  Diagnosis Date  . Anxiety   . HTN (hypertension)   . Atrial fibrillation   . Dementia   . Tremor   . Osteoporosis     prev intolerant of alendronate  . Myocardial infarction   . Anemia   . Hypertension   . Closed left hip fracture   . Arthritis   . DEMENTIA   . Pneumonia   . Hypothyroid   . Hypothyroidism   . Depression     Past Surgical History  Procedure Date  . Head surgery     for trigeminal neuralgia  . Total hip arthroplasty     right  . Cataract extraction   . Appendectomy   . Cataract extraction, bilateral   . Rotator cuff repair     left  . Closed reduction humerus fracture     left    Family History  Problem Relation Age of Onset  . Cancer    . Cancer Sister     breast cancer  . Cancer Brother     lung cancer    History  Substance Use Topics  . Smoking status: Never Smoker   . Smokeless tobacco: Never Used  . Alcohol Use: No    OB History    Grav Para Term Preterm Abortions TAB SAB Ect Mult Living                  Review of Systems UNABLE TO OBTAIN ROS DUE TO LEVEL 5 CAVEAT  Allergies  Alendronate sodium; Aricept; and Warfarin sodium  Home Medications   No current outpatient prescriptions on file.  BP 142/83  Pulse 82  Temp 97.8 F (36.6 C) (Oral)  Resp 16  Ht 5\' 4"  (1.626 m)  SpO2 94% Vitals reviewed Physical Exam Physical  Examination: General appearance - alert, well appearing, and in no distress Mental status - alert, oriented to person, place, and time Eyes - pupils equal and reactive, no conjunctival injection, no scleral icterus Mouth - mucous membranes dry, OP without lesions Chest - clear to auscultation, no wheezes, rales or rhonchi, symmetric air entry Heart - normal rate, regular rhythm, normal S1, S2, no murmurs, rubs, clicks or gallops Abdomen - soft, nontender, nondistended, no masses or organomegaly Neurological - alert, oriented to person, not to place or time, strength 5/5 in extremities x 4, sensation grossly intact, cranial nerves grossly intact Extremities - peripheral pulses normal, no pedal edema, no clubbing or cyanosis Skin - normal coloration and turgor, no rashes  ED Course  Procedures (including critical care time)   Date: 12/06/2012  Rate: 77  Rhythm: atrial fibrillation  QRS Axis: normal  Intervals: indeterminate  ST/T Wave abnormalities: nonspecific T wave changes  Conduction Disutrbances:none  Narrative Interpretation:   Old EKG Reviewed: none available  3:11 PM  D/w [facility] for admission- he will see patient in the ED.  Labs Reviewed  CBC - Abnormal; Notable for the following:    Platelets 87 (*)     All other components within normal limits  COMPREHENSIVE METABOLIC PANEL - Abnormal; Notable for the following:    GFR calc non Af Amer 49 (*)     GFR calc Af Amer 57 (*)     All other components within normal limits  URINALYSIS, ROUTINE W REFLEX MICROSCOPIC - Abnormal; Notable for the following:    Color, Urine AMBER (*)  BIOCHEMICALS MAY BE AFFECTED BY COLOR   APPearance TURBID (*)     Ketones, ur TRACE (*)     Protein, ur 100 (*)     Leukocytes, UA SMALL (*)     All other components within normal limits  GLUCOSE, CAPILLARY - Abnormal; Notable for the following:    Glucose-Capillary 109 (*)     All other components within normal limits  URINE MICROSCOPIC-ADD ON  - Abnormal; Notable for the following:    Bacteria, UA MANY (*)     Casts HYALINE CASTS (*)     All other components within normal limits  DIGOXIN LEVEL - Abnormal; Notable for the following:    Digoxin Level 0.7 (*)     All other components within normal limits  BASIC METABOLIC PANEL - Abnormal; Notable for the following:    GFR calc non Af Amer 59 (*)     GFR calc Af Amer 68 (*)     All other components within normal limits  CBC - Abnormal; Notable for the following:    Platelets 87 (*)  CONSISTENT WITH PREVIOUS RESULT   All other components within normal limits  TROPONIN I  MAGNESIUM  PHOSPHORUS  TSH  URINE CULTURE   Ct Head Wo Contrast  12/06/2012  *RADIOLOGY REPORT*  Clinical Data: Loss of consciousness.  CT HEAD WITHOUT CONTRAST  Technique:  Contiguous axial images were obtained from the base of the skull through the vertex without contrast.  Comparison: Brain MRI 09/02/2006.  Findings: Mild cerebral and cerebellar atrophy.  Extensive patchy and confluent areas of decreased attenuation throughout the deep and periventricular white matter of the cerebral hemispheres bilaterally, compatible with advanced chronic microvascular ischemic disease.  No acute intracranial abnormalities. Specifically, no definite signs of acute post-traumatic intracranial hemorrhage, no evidence of acute/subacute cerebral ischemia, no mass, mass effect, hydrocephalus or abnormal intra or extra-axial fluid collections.  Postoperative changes of right- sided occipital craniectomy are noted.  No acute displaced skull fractures are evident.  Visualized paranasal sinuses and mastoids are generally well pneumatized, with exception of some mild mucosal thickening throughout the ethmoid sinuses bilaterally.  IMPRESSION: 1.  No acute intracranial abnormalities. 2.  Cerebral and cerebellar atrophy with extensive chronic microvascular ischemic changes in the white matter, as above.   Original Report Authenticated By: Trudie Reed, M.D.      1. Syncope   2. Atrial fibrillation   3. Urinary tract infection   4. Dementia   5. HTN (hypertension)   6. Orthostatic hypotension   7. Thrombocytopenia   8. UTI (lower urinary tract infection)       MDM  Pt presenting after episode of syncope at home.  She has baseline dementia.  Pt appears dehydrated, has orthostatic hypotension on workup and findings of UTI.  She is in atrial fibrillation which is her baseline- rate controlled.  Head CT reassuring without acute findings.  Pt admitted [facility] for further evaluation.  Ethelda Chick, MD 12/07/12 [phone]

## 2012-12-06 NOTE — ED Notes (Signed)
[id number]<AV> Expected date:12/06/12<BR> Expected time:11:14 AM<BR> Means of arrival:<BR> Comments:<BR> Near Syncope after BM

## 2012-12-07 DIAGNOSIS — R55 Syncope and collapse: Secondary | ICD-10-CM

## 2012-12-07 DIAGNOSIS — D649 Anemia, unspecified: Secondary | ICD-10-CM

## 2012-12-07 LAB — CBC
HCT: 38 % (ref 36.0–46.0)
Hemoglobin: 12.2 g/dL (ref 12.0–15.0)
MCV: 93.1 fL (ref 78.0–100.0)
RDW: 13.9 % (ref 11.5–15.5)
WBC: 4.5 10*3/uL (ref 4.0–10.5)

## 2012-12-07 LAB — BASIC METABOLIC PANEL
CO2: 26 mEq/L (ref 19–32)
Chloride: 108 mEq/L (ref 96–112)
Creatinine, Ser: 0.86 mg/dL (ref 0.50–1.10)
Potassium: 4.3 mEq/L (ref 3.5–5.1)

## 2012-12-07 LAB — URINE CULTURE: Colony Count: >=100000

## 2012-12-07 NOTE — Progress Notes (Signed)
Patient ID: [name], female   DOB: [birth year], 79 y.o.   MRN: [id number]  [facility] PROGRESS NOTE  [name] JYN:[id number] DOB: 08/27/1922 DOA: 12/06/2012 PCP: [name], MD  Brief narrative: Pt is 79 yo female with history of Atrial fibrillation (on metoprolol and digoxin), CHF (metoprolol and lasix), and dementia who has had prior admission ~ 4 months ago for orthostatic hypotension. She presents to the ED after "passing out" for approximately 40 minutes while she was sitting on the commode. No seizure like activity reported. No bladder and bowel incontinence. Patient reportedly came back to consciousness without intervention and reportedly patient had palor associated with the event. Pt denies any recent fevers, chills, abdominal pain, nausea. Family reports that patient take lasix regularly but reports that patient has a hard time drinking fluids and has always had this problem.  In the ED patient was found to have a blood pressure of 85/63 which improved with normal saline administration and reportedly when they tried to ambulate patient her blood pressures were significantly lower.    Principal Problem:  *Orthostatic hypotension - pt clinically improving and BP stabilizing - will continue to provide supportive care - electrolytes and CBC stable and within normal limits - PT evaluation in AM Active Problems:  Syncope and collapse - secondary to principal problem - continue supportive care as noted above  UTI (lower urinary tract infection) - continue Rocephin day #2   Atrial fibrillation - in sinus rhythm this AM - rate controlled  HTN (hypertension) - reasonable control during the hospital stay   Hypothyroidism - continue synthroid   Thrombocytopenia - at pt's baseline - CBC in AM  Consultants:  None  Procedures/Studies: Ct Head Wo Contrast 12/06/2012   IMPRESSION:  1.  No acute intracranial abnormalities.  2.  Cerebral and cerebellar atrophy  with extensive chronic microvascular ischemic changes in the white matter, as above.    Antibiotics:  Rocephin 01/11 -->  Code Status: Full Family Communication: Pt at bedside Disposition Plan: Home when medically stable  HPI/Subjective: No events overnight.   Objective: Filed Vitals:   12/07/12 0623 12/07/12 1052 12/07/12 1059 12/07/12 1105  BP: 142/83 127/76 144/101 99/68  Pulse: 82 69    Temp: 97.8 F (36.6 C) 97.7 F (36.5 C)    TempSrc: Oral Oral    Resp: 16 16    Height:      Weight:      SpO2: 94%   97%    Intake/Output Summary (Last 24 hours) at 12/07/12 1759 Last data filed at 12/07/12 1603  Gross per 24 hour  Intake 1568.75 ml  Output    700 ml  Net 868.75 ml    Exam:   General:  Pt is alert, follows commands appropriately, not in acute distress  Cardiovascular: Regular rate and rhythm, S1/S2, no murmurs, no rubs, no gallops  Respiratory: Clear to auscultation bilaterally, decreased breath sounds at bases   Abdomen: Soft, non tender, non distended, bowel sounds present, no guarding  Extremities: No edema, pulses DP and PT palpable bilaterally  Neuro: Grossly nonfocal  Data Reviewed: Basic Metabolic Panel:  Lab 12/07/12 8657 12/06/12 1255 12/06/12 1225  NA 143 142 --  K 4.3 3.8 --  CL 108 103 --  CO2 26 30 --  GLUCOSE 83 96 --  BUN 15 16 --  CREATININE 0.86 1.00 --  CALCIUM 9.1 9.4 --  MG -- -- 2.2  PHOS -- -- 3.2   Liver Function Tests:  Lab 12/06/12 1255  AST 22  ALT 16  ALKPHOS 72  BILITOT 0.4  PROT 6.1  ALBUMIN 3.5   CBC:  Lab 12/07/12 0550 12/06/12 1255  WBC 4.5 6.0  NEUTROABS -- --  HGB 12.2 13.9  HCT 38.0 41.8  MCV 93.1 92.3  PLT 87* 87*   Cardiac Enzymes:  Lab 12/06/12 1255  CKTOTAL --  CKMB --  CKMBINDEX --  TROPONINI <0.30   CBG:  Lab 12/06/12 1209  GLUCAP 109*   Scheduled Meds:   . buPROPion  100 mg Oral q morning - 10a  . cefTRIAXone (ROCEPHIN)  IV  1 g Intravenous Q24H  . cholecalciferol  400  Units Oral BID  . digoxin  125 mcg Oral q morning - 10a  . feeding supplement  237 mL Oral BID BM  . ferrous sulfate  325 mg Oral BID  . levothyroxine  50 mcg Oral QAC breakfast  . metoprolol tartrate  12.5 mg Oral BID  . sertraline  25 mg Oral q morning - 10a  . sodium chloride  3 mL Intravenous Q12H   Continuous Infusions:   . sodium chloride 75 mL/hr at 12/07/12 0741     Debbora Presto, MD  [facility] Pager [phone]  If 7PM-7AM, please contact night-coverage [email or web address] Password [email or web address] 12/07/2012, 5:59 PM   LOS: 1 day

## 2012-12-07 NOTE — Progress Notes (Signed)
VASCULAR LAB PRELIMINARY  PRELIMINARY  PRELIMINARY  PRELIMINARY  Carotid duplex  completed.    Preliminary report:  Bilateral:  No evidence of hemodynamically significant internal carotid artery stenosis.   Vertebral artery flow is antegrade.      Don Giarrusso, RVT 12/07/2012, 3:27 PM

## 2012-12-08 LAB — CBC
HCT: 36.2 % (ref 36.0–46.0)
Hemoglobin: 11.9 g/dL — ABNORMAL LOW (ref 12.0–15.0)
MCH: 30.4 pg (ref 26.0–34.0)
MCV: 92.3 fL (ref 78.0–100.0)
RBC: 3.92 MIL/uL (ref 3.87–5.11)

## 2012-12-08 LAB — BASIC METABOLIC PANEL
BUN: 10 mg/dL (ref 6–23)
CO2: 27 mEq/L (ref 19–32)
Calcium: 8.9 mg/dL (ref 8.4–10.5)
Creatinine, Ser: 0.73 mg/dL (ref 0.50–1.10)
Glucose, Bld: 86 mg/dL (ref 70–99)

## 2012-12-08 MED ORDER — POTASSIUM CHLORIDE CRYS ER 20 MEQ PO TBCR
40.0000 meq | EXTENDED_RELEASE_TABLET | Freq: Once | ORAL | Status: AC
  Administered 2012-12-08: 40 meq via ORAL
  Filled 2012-12-08: qty 2

## 2012-12-08 MED ORDER — SODIUM CHLORIDE 0.9 % IV BOLUS (SEPSIS)
500.0000 mL | Freq: Once | INTRAVENOUS | Status: AC
  Administered 2012-12-08: 500 mL via INTRAVENOUS

## 2012-12-08 MED ORDER — LORAZEPAM 0.5 MG PO TABS
0.5000 mg | ORAL_TABLET | Freq: Once | ORAL | Status: AC
  Administered 2012-12-08: 0.5 mg via ORAL
  Filled 2012-12-08: qty 1

## 2012-12-08 NOTE — Progress Notes (Signed)
Patient ID: [name], female   DOB: 06/09/1922, 79 y.o.   MRN: [id number]  [facility] PROGRESS NOTE  [name] QIO:[id number] DOB: 1922-10-02 DOA: 12/06/2012 PCP: [name], MD  Brief narrative:  Pt is 79 yo female with history of Atrial fibrillation (on metoprolol and digoxin), CHF (metoprolol and lasix), and dementia who has had prior admission ~ 4 months ago for orthostatic hypotension. She presents to the ED after "passing out" for approximately 40 minutes while she was sitting on the commode. No seizure like activity reported. No bladder and bowel incontinence. Patient reportedly came back to consciousness without intervention and reportedly patient had palor associated with the event. Pt denies any recent fevers, chills, abdominal pain, nausea. Family reports that patient take lasix regularly but reports that patient has a hard time drinking fluids and has always had this problem.  In the ED patient was found to have a blood pressure of 85/63 which improved with normal saline administration and reportedly when they tried to ambulate patient her blood pressures were significantly lower.   Principal Problem:  *Orthostatic hypotension  - pt clinically improving and BP stabilizing  - Orthostatic hypotension noted per blood pressure check - will continue to provide supportive care  - electrolytes and CBC stable and within normal limits  - PT evaluation done and recommendation is for skilled nursing facility placement - Patient has 24 hour care providers at home and person on wish, patient will be discharged home with 24-hour care - Will provide additional bolus of normal saline  Active Problems:  Syncope and collapse  - secondary to principal problem  - continue supportive care as noted above as well as bolus of normal saline as noted above UTI (lower urinary tract infection)  - continue Rocephin day #3 Atrial fibrillation  - in sinus rhythm this AM  - Rate and low  100's, continue metoprolol at the current dose 12.5 mg BID HTN (hypertension)  - reasonable control during the hospital stay  Hypothyroidism  - continue synthroid  Thrombocytopenia  - at pt's baseline  - CBC in AM   Consultants:  PT Procedures/Studies:  Ct Head Wo Contrast  12/06/2012  IMPRESSION:  1. No acute intracranial abnormalities.  2. Cerebral and cerebellar atrophy with extensive chronic microvascular ischemic changes in the white matter, as above.  Antibiotics:  Rocephin 01/11 --> Code Status: Full  Family Communication: Pt at bedside  Disposition Plan: Home when medically stable, likely in AM  HPI/Subjective: No events overnight.   Objective: Filed Vitals:   12/08/12 1038 12/08/12 1043 12/08/12 1058 12/08/12 1431  BP: 146/71 107/74  151/74  Pulse: 109 120  99  Temp:    98.4 F (36.9 C)  TempSrc:    Oral  Resp: 16 16  16   Height:      Weight:   42.683 kg (94 lb 1.6 oz)   SpO2: 99% 96%  97%    Intake/Output Summary (Last 24 hours) at 12/08/12 1549 Last data filed at 12/08/12 0600  Gross per 24 hour  Intake    900 ml  Output    200 ml  Net    700 ml    Exam:   General:  Pt is alert, follows commands appropriately, not in acute distress  Cardiovascular: Regular rhythm, tachycardic, S1/S2, no murmurs, no rubs, no gallops  Respiratory: Clear to auscultation bilaterally, no wheezing, no crackles, no rhonchi  Abdomen: Soft, non tender, non distended, bowel sounds present, no guarding  Extremities: No  edema, pulses DP and PT palpable bilaterally  Neuro: Grossly nonfocal, oriented to name only  Data Reviewed: Basic Metabolic Panel:  Lab 12/08/12 8413 12/07/12 0550 12/06/12 1255 12/06/12 1225  NA 141 143 142 --  K 3.3* 4.3 3.8 --  CL 107 108 103 --  CO2 27 26 30  --  GLUCOSE 86 83 96 --  BUN 10 15 16  --  CREATININE 0.73 0.86 1.00 --  CALCIUM 8.9 9.1 9.4 --  MG -- -- -- 2.2  PHOS -- -- -- 3.2   Liver Function Tests:  Lab 12/06/12 1255  AST  22  ALT 16  ALKPHOS 72  BILITOT 0.4  PROT 6.1  ALBUMIN 3.5   CBC:  Lab 12/08/12 0517 12/07/12 0550 12/06/12 1255  WBC 3.8* 4.5 6.0  NEUTROABS -- -- --  HGB 11.9* 12.2 13.9  HCT 36.2 38.0 41.8  MCV 92.3 93.1 92.3  PLT 83* 87* 87*   Cardiac Enzymes:  Lab 12/06/12 1255  CKTOTAL --  CKMB --  CKMBINDEX --  TROPONINI <0.30   CBG:  Lab 12/06/12 1209  GLUCAP 109*    Recent Results (from the past 240 hour(s))  URINE CULTURE     Status: Normal   Collection Time   12/06/12  1:22 PM      Component Value Range Status Comment   Specimen Description URINE, CATHETERIZED   Final    Special Requests NONE   Final    Culture  Setup Time 12/06/2012 18:50   Final    Colony Count >=100,000 COLONIES/ML   Final    Culture VIRIDANS STREPTOCOCCUS   Final    Report Status 12/07/2012 FINAL   Final      Scheduled Meds:   . buPROPion  100 mg Oral q morning - 10a  . cefTRIAXone (ROCEPHIN)  IV  1 g Intravenous Q24H  . cholecalciferol  400 Units Oral BID  . digoxin  125 mcg Oral q morning - 10a  . feeding supplement  237 mL Oral BID BM  . ferrous sulfate  325 mg Oral BID  . levothyroxine  50 mcg Oral QAC breakfast  . metoprolol tartrate  12.5 mg Oral BID  . sertraline  25 mg Oral q morning - 10a  . sodium chloride  3 mL Intravenous Q12H   Continuous Infusions:   . sodium chloride 75 mL/hr at 12/07/12 0741     Debbora Presto, MD  [facility] Pager [phone]  If 7PM-7AM, please contact night-coverage [email or web address] Password [email or web address] 12/08/2012, 3:49 PM   LOS: 2 days

## 2012-12-08 NOTE — Progress Notes (Signed)
Patient's orthostatic blood pressure dropped from 146/71 to 107/74 sitting to standing.  Dr. Izola Price made aware.  Patient given a 500cc's normal saline bolus.  Patient on bed rest with bed alarm on.  Will continue to monitor.

## 2012-12-08 NOTE — Evaluation (Signed)
Physical Therapy Evaluation Patient Details Name: [name] MRN: [id number] DOB: 02-02-1922 Today's Date: 12/08/2012 Time: 8657-8469 PT Time Calculation (min): 27 min  PT Assessment / Plan / Recommendation Clinical Impression  Pt is 79 yo female with history of Atrial fibrillation, CHF , and dementia who has had prior admission ~ 4 months ago for orthostatic hypotension. She presents to the ED after "passing out" for approximately 40 minutes while she was sitting on the commode; Will benefit  from PT to improve functinal independence and prevent complications related to bedrest    PT Assessment  Patient needs continued PT services    Follow Up Recommendations  SNF;Supervision/Assistance - 24 hour (HHPT if home with 24hr assist)    Does the patient have the potential to tolerate intense rehabilitation      Barriers to Discharge Decreased caregiver support (??)      Equipment Recommendations  Rolling walker with 5" wheels    Recommendations for Other Services     Frequency      Precautions / Restrictions Precautions Precautions: Fall   Pertinent Vitals/Pain BP lying 133/75 BP sitting 135/76 BP standing 136/76 Pt reports dizziness with standing      Mobility  Bed Mobility Bed Mobility: Supine to Sit;Sit to Supine Supine to Sit: 3: Mod assist;2: Max assist;HOB flat Sit to Supine: 3: Mod assist;HOB flat Details for Bed Mobility Assistance: assist with trunk and LEs, cues for self assist and technique  Transfers Transfers: Sit to Stand;Stand to Sit;Stand Pivot Transfers Sit to Stand: 3: Mod assist;From bed;From chair/3-in-1 Stand to Sit: 3: Mod assist;To chair/3-in-1;To bed Stand Pivot Transfers: 3: Mod assist Details for Transfer Assistance: cues for technique and safety; posterior bias, assist to prevent fall Ambulation/Gait Ambulation/Gait Assistance: Not tested (comment) (unable;)    Shoulder Instructions     Exercises     PT Diagnosis: Difficulty  walking;Generalized weakness  PT Problem List: Decreased range of motion;Decreased activity tolerance;Decreased balance;Decreased mobility;Decreased knowledge of use of DME;Decreased safety awareness;Decreased cognition;Decreased strength PT Treatment Interventions: DME instruction;Gait training;Functional mobility training;Therapeutic activities;Therapeutic exercise;Balance training;Patient/family education   PT Goals Acute Rehab PT Goals PT Goal Formulation: Patient unable to participate in goal setting Time For Goal Achievement: 12/22/12 Potential to Achieve Goals: Fair Pt will go Supine/Side to Sit: with min assist PT Goal: Supine/Side to Sit - Progress: Goal set today Pt will go Sit to Supine/Side: with min assist PT Goal: Sit to Supine/Side - Progress: Goal set today Pt will go Sit to Stand: with min assist PT Goal: Sit to Stand - Progress: Goal set today Pt will go Stand to Sit: with min assist PT Goal: Stand to Sit - Progress: Goal set today Pt will Transfer Bed to Chair/Chair to Bed: with min assist PT Transfer Goal: Bed to Chair/Chair to Bed - Progress: Goal set today Pt will Ambulate: with min assist;1 - 15 feet;with least restrictive assistive device PT Goal: Ambulate - Progress: Goal set today  Visit Information  Last PT Received On: 12/08/12 Assistance Needed: +1    Subjective Data  Subjective: "we are in the hospital; Now, look out the window and see if you see a man there waiting on me." Patient Stated Goal: home   Prior Functioning  Home Living Lives With: Alone Type of Home: House Home Access: Stairs to enter Entrance Stairs-Number of Steps: 1 Home Layout: One level Home Adaptive Equipment: None Additional Comments: son lives next door, works, later pt states that she lives with her husband and her son  is retired Prior Function Level of Independence: Independent Able to Take Stairs?: Yes Driving: Yes Communication Communication: No difficulties      Cognition  Overall Cognitive Status: History of cognitive impairments - at baseline Area of Impairment: Memory;Safety/judgement;Awareness of errors;Awareness of deficits Arousal/Alertness: Awake/alert Orientation Level: Disoriented to;Place;Time;Situation Behavior During Session: [name] for tasks performed Memory Deficits: pt unable to give reliable hx/socail situation Safety/Judgement: Decreased safety judgement for tasks assessed;Impulsive;Decreased awareness of need for assistance Safety/Judgement - Other Comments: attempts to get OOB on side with rail raised Awareness of Deficits: pt gets up (attempts to stand) stating that she is goingn to walk outside and see if her husband is there Cognition - Other Comments: impaired at baseline    Extremity/Trunk Assessment Right Upper Extremity Assessment RUE ROM/Strength/Tone: [facility] for tasks assessed Left Upper Extremity Assessment LUE ROM/Strength/Tone: WFL for tasks assessed Right Lower Extremity Assessment RLE ROM/Strength/Tone: Deficits RLE ROM/Strength/Tone Deficits: AAROM grossly WFL, decreased functional strength, requiring  mod assist to stand Left Lower Extremity Assessment LLE ROM/Strength/Tone: Deficits LLE ROM/Strength/Tone Deficits: AAROM grossly WFL, decreased functional strength, requiring  mod assist to stand   Balance Static Sitting Balance Static Sitting - Balance Support: Feet supported;Right upper extremity supported Static Sitting - Level of Assistance: 4: Min Oncologist Standing - Balance Support: During functional activity Static Standing - Level of Assistance: 3: Mod assist  End of Session PT - End of Session Equipment Utilized During Treatment: Gait belt Activity Tolerance: Patient tolerated treatment well Patient left: in chair;with call bell/phone within reach;with bed alarm set Nurse Communication: Mobility status  GP     [name] 12/08/2012, 2:44 PM

## 2012-12-09 LAB — BASIC METABOLIC PANEL
BUN: 7 mg/dL (ref 6–23)
Calcium: 9.4 mg/dL (ref 8.4–10.5)
Creatinine, Ser: 0.62 mg/dL (ref 0.50–1.10)
GFR calc Af Amer: >90 mL/min (ref >90)

## 2012-12-09 LAB — CBC
HCT: 38.1 % (ref 36.0–46.0)
MCH: 30.8 pg (ref 26.0–34.0)
MCV: 90.9 fL (ref 78.0–100.0)
Platelets: 74 10*3/uL — ABNORMAL LOW (ref 150–400)
RDW: 13.5 % (ref 11.5–15.5)

## 2012-12-09 MED ORDER — HALOPERIDOL LACTATE 5 MG/ML IJ SOLN
5.0000 mg | Freq: Four times a day (QID) | INTRAMUSCULAR | Status: DC | PRN
  Administered 2012-12-09: 5 mg via INTRAMUSCULAR
  Filled 2012-12-09: qty 1

## 2012-12-09 NOTE — Progress Notes (Signed)
Family requesting ambulance transport.  Spoke with Pt's sister re: the possibility of having to pay out-of-pocket for the ambulance cost.  Pt's sister stated that she understood, however she doesn't feel comfortable transporting Pt, as Pt is very weak and cannot assist sister or sister's husband with transfers.  CSW arranged for [facility] transport.  Pt to be d/c'd.  Providence Crosby, LCSWA Clinical Social Work [phone]

## 2012-12-09 NOTE — Progress Notes (Signed)
Discharge instructions given to pt/family, verbalized understanding. Left the unit in stable condition. 

## 2012-12-09 NOTE — Discharge Summary (Signed)
Physician Discharge Summary  [name] UJW:[id number] DOB: Mar 19, 1922 DOA: 12/06/2012  PCP: [name], MD  Admit date: 12/06/2012 Discharge date: 12/09/2012  Recommendations for Outpatient Follow-up:  1. Pt will need to follow up with PCP in 2-3 weeks post discharge 2. Please obtain BMP to evaluate electrolytes and kidney function 3. Please also check CBC to evaluate Hg and Hct levels  Discharge Diagnoses: Orthostatic hypotension Orthostatic hypotension  Principal Problem:  *Orthostatic hypotension Active Problems:  Syncope and collapse  UTI (lower urinary tract infection)  Atrial fibrillation  HTN (hypertension)  Hypothyroidism  Thrombocytopenia  Discharge Condition: Stable  Diet recommendation: Heart healthy diet discussed in details   Brief narrative:  Pt is 79 yo female with history of Atrial fibrillation (on metoprolol and digoxin), CHF (metoprolol and lasix), and dementia who has had prior admission ~ 4 months ago for orthostatic hypotension. She presents to the ED after "passing out" for approximately 40 minutes while she was sitting on the commode. No seizure like activity reported. No bladder and bowel incontinence. Patient reportedly came back to consciousness without intervention and reportedly patient had palor associated with the event. Pt denies any recent fevers, chills, abdominal pain, nausea. Family reports that patient take lasix regularly but reports that patient has a hard time drinking fluids and has always had this problem.  In the ED patient was found to have a blood pressure of 85/63 which improved with normal saline administration and reportedly when they tried to ambulate patient her blood pressures were significantly lower.  Principal Problem:  *Orthostatic hypotension  - pt clinically improving and BP stabilizing  - Orthostatic hypotension noted per blood pressure check  - continued to provide supportive care  - electrolytes and CBC stable and  within normal limits  - PT evaluation done and recommendation is for skilled nursing facility placement but please note that I have discussed with son and pt has 2 care providers at home 24 hour care Active Problems:  Syncope and collapse  - secondary to principal problem  - continued supportive care as noted above  UTI (lower urinary tract infection)  - continue Rocephin day #3  Atrial fibrillation  - in sinus rhythm this AM  - Rate and low 100's, continue metoprolol at the current dose 12.5 mg BID  HTN (hypertension)  - reasonable control during the hospital stay  Hypothyroidism  - continue synthroid  Thrombocytopenia  - at pt's baseline   Consultants:  PT - supervision 24 hours --> pt has 24 hours care provided at home Procedures/Studies:  Ct Head Wo Contrast  12/06/2012  IMPRESSION:  1. No acute intracranial abnormalities.  2. Cerebral and cerebellar atrophy with extensive chronic microvascular ischemic changes in the white matter, as above.  Antibiotics:  Rocephin 01/11 --> will discontinue upon discharge as pt has completed the therapy   Discharge Exam: Filed Vitals:   12/09/12 0537  BP: 142/87  Pulse: 92  Temp: 97.7 F (36.5 C)  Resp: 20   Filed Vitals:   12/08/12 1058 12/08/12 1431 12/08/12 2148 12/09/12 0537  BP:  151/74 136/68 142/87  Pulse:  99 90 92  Temp:  98.4 F (36.9 C) 97.7 F (36.5 C) 97.7 F (36.5 C)  TempSrc:  Oral Oral Oral  Resp:  16 16 20   Height:      Weight: 42.683 kg (94 lb 1.6 oz)     SpO2:  97% 97% 96%    General: Pt is alert and oriented to name only, follows some  commands appropriately, not in acute distress Cardiovascular: Irregular rate and rhythm, no murmurs, no rubs, no gallops Respiratory: Clear to auscultation bilaterally, no wheezing, no crackles, no rhonchi Abdominal: Soft, non tender, non distended, bowel sounds +, no guarding Extremities: no edema, no cyanosis, pulses palpable bilaterally DP and PT  Discharge  Instructions  Discharge Orders    Future Orders Please Complete By Expires   Diet - low sodium heart healthy      Increase activity slowly          Medication List     As of 12/09/2012 10:25 AM    TAKE these medications         buPROPion 100 MG tablet   Commonly known as: WELLBUTRIN   Take 100 mg by mouth every morning.      cholecalciferol 400 UNITS Tabs   Commonly known as: VITAMIN D   Take 400 Units by mouth 2 (two) times daily.      digoxin 0.125 MG tablet   Commonly known as: LANOXIN   Take 125 mcg by mouth every morning.      docusate sodium 100 MG capsule   Commonly known as: COLACE   Take 100 mg by mouth every evening.      feeding supplement Liqd   Take 237 mLs by mouth 2 (two) times daily between meals.      ferrous sulfate 325 (65 FE) MG tablet   Take 325 mg by mouth 2 (two) times daily.      furosemide 20 MG tablet   Commonly known as: LASIX   Take 20 mg by mouth See admin instructions. Pt takes 1 tab every day except Saturday and Monday      levothyroxine 50 MCG tablet   Commonly known as: SYNTHROID, LEVOTHROID   Take 1 tablet (50 mcg total) by mouth daily. 6 days a week, not on Sundays      metoprolol tartrate 25 MG tablet   Commonly known as: LOPRESSOR   Take 25 mg by mouth 2 (two) times daily.      multivitamin tablet   Take 1 tablet by mouth every morning.      OYSTER SHELL CALCIUM 500 + D PO   Take 1 tablet by mouth every evening.      senna 8.6 MG Tabs   Commonly known as: SENOKOT   Take 1 tablet by mouth every evening.      sertraline 25 MG tablet   Commonly known as: ZOLOFT   Take 25 mg by mouth every morning.           Follow-up Information    Follow up with [name], MD. In 1 week.   Contact information:   [address] [place] [address] [phone]           The results of significant diagnostics from this hospitalization (including imaging, microbiology, ancillary and laboratory) are listed below  for reference.     Microbiology: Recent Results (from the past 240 hour(s))  URINE CULTURE     Status: Normal   Collection Time   12/06/12  1:22 PM      Component Value Range Status Comment   Specimen Description URINE, CATHETERIZED   Final    Special Requests NONE   Final    Culture  Setup Time 12/06/2012 18:50   Final    Colony Count >=100,000 COLONIES/ML   Final    Culture VIRIDANS STREPTOCOCCUS   Final    Report Status  12/07/2012 FINAL   Final      Labs: Basic Metabolic Panel:  Lab 12/09/12 1610 12/08/12 0517 12/07/12 0550 12/06/12 1255 12/06/12 1225  NA 140 141 143 142 --  K 3.5 3.3* 4.3 3.8 --  CL 107 107 108 103 --  CO2 24 27 26 30  --  GLUCOSE 85 86 83 96 --  BUN 7 10 15 16  --  CREATININE 0.62 0.73 0.86 1.00 --  CALCIUM 9.4 8.9 9.1 9.4 --  MG -- -- -- -- 2.2  PHOS -- -- -- -- 3.2   Liver Function Tests:  Lab 12/06/12 1255  AST 22  ALT 16  ALKPHOS 72  BILITOT 0.4  PROT 6.1  ALBUMIN 3.5   CBC:  Lab 12/09/12 0515 12/08/12 0517 12/07/12 0550 12/06/12 1255  WBC 4.8 3.8* 4.5 6.0  NEUTROABS -- -- -- --  HGB 12.9 11.9* 12.2 13.9  HCT 38.1 36.2 38.0 41.8  MCV 90.9 92.3 93.1 92.3  PLT 74* 83* 87* 87*   Cardiac Enzymes:  Lab 12/06/12 1255  CKTOTAL --  CKMB --  CKMBINDEX --  TROPONINI <0.30   CBG:  Lab 12/06/12 1209  GLUCAP 109*    SIGNED: Time coordinating discharge: Over 30 minutes  Debbora Presto, MD  [facility] 12/09/2012, 10:25 AM Pager [phone]  If 7PM-7AM, please contact night-coverage [email or web address] Password [email or web address]

## 2012-12-15 ENCOUNTER — Other Ambulatory Visit: Payer: Self-pay | Admitting: *Deleted

## 2012-12-15 MED ORDER — METOPROLOL TARTRATE 25 MG PO TABS: ORAL_TABLET | ORAL | Status: DC

## 2012-12-17 ENCOUNTER — Ambulatory Visit: Payer: Self-pay | Admitting: Family Medicine

## 2012-12-17 ENCOUNTER — Other Ambulatory Visit: Payer: Self-pay

## 2012-12-18 ENCOUNTER — Encounter: Payer: Self-pay | Admitting: Family Medicine

## 2012-12-18 ENCOUNTER — Ambulatory Visit (INDEPENDENT_AMBULATORY_CARE_PROVIDER_SITE_OTHER): Payer: Medicare Other | Admitting: Family Medicine

## 2012-12-18 VITALS — BP 98/60 | HR 72 | Temp 97.9°F

## 2012-12-18 DIAGNOSIS — I951 Orthostatic hypotension: Secondary | ICD-10-CM

## 2012-12-18 DIAGNOSIS — R55 Syncope and collapse: Secondary | ICD-10-CM

## 2012-12-18 DIAGNOSIS — F0390 Unspecified dementia without behavioral disturbance: Secondary | ICD-10-CM

## 2012-12-18 DIAGNOSIS — Z7189 Other specified counseling: Secondary | ICD-10-CM | POA: Insufficient documentation

## 2012-12-18 NOTE — Patient Instructions (Signed)
Melatonin is okay to use for sleep.  I would get a cushion for the chair to help prevent skin breakdown.  Don't use the lasix unless significant swelling is present.  Continue to offer fluids.  Go to the lab on the way out.  We'll contact you with your lab report.  Take care.

## 2012-12-18 NOTE — Progress Notes (Signed)
Hospital f/u.  Discharge summary reviewed.    Admitted with orthostatic hypotension.  Had UTI.  Both treated.  Due for f/u today.  Denies FCNAV, no pain noted by patient or family.  Still with marginal po liquid  intake. Rare use of lasix, only with sig edema.    H/o dementia.  Mostly sits in a chair during the day.  Sleep cycle can be disrupted from that.  No skin breakdown.    H/o AF noted.    Meds, vitals, and allergies reviewed.   ROS: See HPI.  Otherwise, noncontributory.  Nad, not oriented to year (1982) Thin, in wheelchair Mmm Neck supple, no LA IRR, not tachy ctab abd soft, not ttp Ext w/o edema Not dizzy on standing.

## 2012-12-18 NOTE — Assessment & Plan Note (Signed)
No sx today.  Check labs.  Would use lasix only if sig edema.  We may need to stop the BB, but I didn't want to risk RVR with AF today.  Will follow clinically.  >25 min spent with face to face with patient, >50% counseling and/or coordinating care

## 2012-12-18 NOTE — Assessment & Plan Note (Signed)
D/w pt's sister about this.  Has 24hour supervision.  Will need to continue this.  She can't make informed decisions at this point.  D/w sister about preventing skin breakdown.  Discussed living will, etc.  Per sister, Pt had stated prev "that if I can't get better, then don't put me on the machines."  Pt isn't able to state her preferences today.

## 2012-12-19 LAB — CBC WITH DIFFERENTIAL/PLATELET
Basophils Absolute: 0 10*3/uL (ref 0.0–0.1)
Eosinophils Relative: 0.6 % (ref 0.0–5.0)
Lymphs Abs: 1.4 10*3/uL (ref 0.7–4.0)
MCV: 91.2 fl (ref 78.0–100.0)
Monocytes Absolute: 0.3 10*3/uL (ref 0.1–1.0)
Monocytes Relative: 7.2 % (ref 3.0–12.0)
Neutrophils Relative %: 60.3 % (ref 43.0–77.0)
Platelets: 133 10*3/uL — ABNORMAL LOW (ref 150.0–400.0)
RDW: 14.2 % (ref 11.5–14.6)
WBC: 4.5 10*3/uL (ref 4.5–10.5)

## 2012-12-19 LAB — BASIC METABOLIC PANEL
Calcium: 9.6 mg/dL (ref 8.4–10.5)
GFR: 58.24 mL/min — ABNORMAL LOW (ref >60.00)
Glucose, Bld: 122 mg/dL — ABNORMAL HIGH (ref 70–99)
Potassium: 4.3 mEq/L (ref 3.5–5.1)
Sodium: 142 mEq/L (ref 135–145)

## 2012-12-22 ENCOUNTER — Encounter: Payer: Self-pay | Admitting: *Deleted

## 2012-12-24 ENCOUNTER — Other Ambulatory Visit: Payer: Self-pay

## 2012-12-24 MED ORDER — FUROSEMIDE 20 MG PO TABS: 20.0000 mg | ORAL_TABLET | ORAL | Status: DC

## 2013-01-04 ENCOUNTER — Emergency Department (HOSPITAL_COMMUNITY)
Admission: EM | Admit: 2013-01-04 | Discharge: 2013-01-04 | Disposition: A | Discharge status: Home / Self Care | Payer: Medicare Other | Attending: Emergency Medicine | Admitting: Emergency Medicine

## 2013-01-04 ENCOUNTER — Encounter (HOSPITAL_COMMUNITY): Payer: Self-pay | Admitting: Emergency Medicine

## 2013-01-04 DIAGNOSIS — Z8781 Personal history of (healed) traumatic fracture: Secondary | ICD-10-CM | POA: Insufficient documentation

## 2013-01-04 DIAGNOSIS — E039 Hypothyroidism, unspecified: Secondary | ICD-10-CM | POA: Insufficient documentation

## 2013-01-04 DIAGNOSIS — F29 Unspecified psychosis not due to a substance or known physiological condition: Secondary | ICD-10-CM | POA: Insufficient documentation

## 2013-01-04 DIAGNOSIS — Z8701 Personal history of pneumonia (recurrent): Secondary | ICD-10-CM | POA: Insufficient documentation

## 2013-01-04 DIAGNOSIS — I252 Old myocardial infarction: Secondary | ICD-10-CM | POA: Insufficient documentation

## 2013-01-04 DIAGNOSIS — I10 Essential (primary) hypertension: Secondary | ICD-10-CM | POA: Insufficient documentation

## 2013-01-04 DIAGNOSIS — Z9889 Other specified postprocedural states: Secondary | ICD-10-CM | POA: Insufficient documentation

## 2013-01-04 DIAGNOSIS — Z8669 Personal history of other diseases of the nervous system and sense organs: Secondary | ICD-10-CM | POA: Insufficient documentation

## 2013-01-04 DIAGNOSIS — F3289 Other specified depressive episodes: Secondary | ICD-10-CM | POA: Insufficient documentation

## 2013-01-04 DIAGNOSIS — R410 Disorientation, unspecified: Secondary | ICD-10-CM

## 2013-01-04 DIAGNOSIS — F0390 Unspecified dementia without behavioral disturbance: Secondary | ICD-10-CM | POA: Insufficient documentation

## 2013-01-04 DIAGNOSIS — M810 Age-related osteoporosis without current pathological fracture: Secondary | ICD-10-CM | POA: Insufficient documentation

## 2013-01-04 DIAGNOSIS — D649 Anemia, unspecified: Secondary | ICD-10-CM | POA: Insufficient documentation

## 2013-01-04 DIAGNOSIS — H5316 Psychophysical visual disturbances: Secondary | ICD-10-CM | POA: Insufficient documentation

## 2013-01-04 DIAGNOSIS — Z87440 Personal history of urinary (tract) infections: Secondary | ICD-10-CM | POA: Insufficient documentation

## 2013-01-04 DIAGNOSIS — Z79899 Other long term (current) drug therapy: Secondary | ICD-10-CM | POA: Insufficient documentation

## 2013-01-04 DIAGNOSIS — M129 Arthropathy, unspecified: Secondary | ICD-10-CM | POA: Insufficient documentation

## 2013-01-04 DIAGNOSIS — F411 Generalized anxiety disorder: Secondary | ICD-10-CM | POA: Insufficient documentation

## 2013-01-04 DIAGNOSIS — F8089 Other developmental disorders of speech and language: Secondary | ICD-10-CM | POA: Insufficient documentation

## 2013-01-04 DIAGNOSIS — F329 Major depressive disorder, single episode, unspecified: Secondary | ICD-10-CM | POA: Insufficient documentation

## 2013-01-04 DIAGNOSIS — I4891 Unspecified atrial fibrillation: Secondary | ICD-10-CM | POA: Insufficient documentation

## 2013-01-04 LAB — URINALYSIS, ROUTINE W REFLEX MICROSCOPIC
Bilirubin Urine: NEGATIVE
Leukocytes, UA: NEGATIVE
Nitrite: NEGATIVE
Specific Gravity, Urine: 1.019 (ref 1.005–1.030)
Urobilinogen, UA: 0.2 mg/dL (ref 0.0–1.0)
pH: 6.5 (ref 5.0–8.0)

## 2013-01-04 LAB — CBC WITH DIFFERENTIAL/PLATELET
Basophils Relative: 0 % (ref 0–1)
Eosinophils Absolute: 0 10*3/uL (ref 0.0–0.7)
HCT: 39.9 % (ref 36.0–46.0)
Hemoglobin: 13.3 g/dL (ref 12.0–15.0)
Lymphs Abs: 1.5 10*3/uL (ref 0.7–4.0)
MCH: 30.8 pg (ref 26.0–34.0)
MCHC: 33.3 g/dL (ref 30.0–36.0)
Monocytes Absolute: 0.6 10*3/uL (ref 0.1–1.0)
Monocytes Relative: 12 % (ref 3–12)

## 2013-01-04 LAB — COMPREHENSIVE METABOLIC PANEL
ALT: 19 U/L (ref 0–35)
AST: 25 U/L (ref 0–37)
Albumin: 3.3 g/dL — ABNORMAL LOW (ref 3.5–5.2)
Calcium: 9.6 mg/dL (ref 8.4–10.5)
Sodium: 141 mEq/L (ref 135–145)
Total Protein: 6.4 g/dL (ref 6.0–8.3)

## 2013-01-04 MED ORDER — SODIUM CHLORIDE 0.9 % IV BOLUS (SEPSIS): 1000.0000 mL | Freq: Once | INTRAVENOUS | Status: DC

## 2013-01-04 NOTE — ED Notes (Signed)
AVW:[id number]<BJ> Expected date:<BR> Expected time:<BR> Means of arrival:<BR> Comments:<BR> Laural Benes, joan

## 2013-01-04 NOTE — ED Provider Notes (Addendum)
History     CSN: [id number]  Arrival date & time 01/04/13  1441   First MD Initiated Contact with Patient 01/04/13 1533      Chief Complaint  Patient presents with  . Altered Mental Status    HPI  The patient presents with her son who provides the history of present illness.  According to the son the patient has had increasing confusion over the past days, without clear time of onset, or any identifiable precipitant.  He states over the past days she is been more disoriented, beyond her baseline, with new visual hallucination.  There are no report of new fever, cough, other overt physical changes. The patient's son states that the patient has these episodes when she has urinary tract infection typically. The patient herself denies pain, though she is oriented only to herself.   Past Medical History  Diagnosis Date  . Anxiety   . HTN (hypertension)   . Atrial fibrillation   . Dementia   . Tremor   . Osteoporosis     prev intolerant of alendronate  . Myocardial infarction   . Anemia   . Hypertension   . Closed left hip fracture   . Arthritis   . DEMENTIA   . Pneumonia   . Hypothyroid   . Hypothyroidism   . Depression     Past Surgical History  Procedure Laterality Date  . Head surgery      for trigeminal neuralgia  . Total hip arthroplasty      right  . Cataract extraction    . Appendectomy    . Cataract extraction, bilateral    . Rotator cuff repair      left  . Closed reduction humerus fracture      left    Family History  Problem Relation Age of Onset  . Cancer    . Cancer Sister     breast cancer  . Cancer Brother     lung cancer    History  Substance Use Topics  . Smoking status: Never Smoker   . Smokeless tobacco: Never Used  . Alcohol Use: No    OB History   Grav Para Term Preterm Abortions TAB SAB Ect Mult Living                  Review of Systems  Unable to perform ROS: Dementia    Allergies  Alendronate sodium; Aricept; and  Warfarin sodium  Home Medications   Current Outpatient Rx  Name  Route  Sig  Dispense  Refill  . buPROPion (WELLBUTRIN) 100 MG tablet   Oral   Take 100 mg by mouth every morning.          . Calcium Carbonate-Vitamin D (OYSTER SHELL CALCIUM 500 + D PO)   Oral   Take 1 tablet by mouth every evening.          . cholecalciferol (VITAMIN D) 400 UNITS TABS   Oral   Take 400 Units by mouth 2 (two) times daily.         . digoxin (LANOXIN) 0.125 MG tablet   Oral   Take 125 mcg by mouth every morning.          . docusate sodium (COLACE) 100 MG capsule   Oral   Take 100 mg by mouth every evening.         . feeding supplement (ENSURE COMPLETE) LIQD   Oral   Take 237 mLs by mouth 2 (two) times  daily between meals.         . ferrous sulfate 325 (65 FE) MG tablet   Oral   Take 325 mg by mouth 2 (two) times daily.           . furosemide (LASIX) 20 MG tablet   Oral   Take 20 mg by mouth 3 (three) times a week. Take daily if needed for swelling         . levothyroxine (SYNTHROID, LEVOTHROID) 50 MCG tablet   Oral   Take 1 tablet (50 mcg total) by mouth daily. 6 days a week, not on Sundays   90 tablet   1   . metoprolol tartrate (LOPRESSOR) 25 MG tablet   Oral   Take 12.5 mg by mouth 2 (two) times daily. Take 1/2 tab let by mouth twice a day         . Multiple Vitamin (MULTIVITAMIN) tablet   Oral   Take 1 tablet by mouth every morning.          . senna (SENOKOT) 8.6 MG TABS   Oral   Take 1 tablet by mouth every evening.         . sertraline (ZOLOFT) 25 MG tablet   Oral   Take 25 mg by mouth every morning.            BP 155/90  Pulse 80  Temp(Src) 98.1 F (36.7 C) (Oral)  Resp 16  SpO2 98%  Physical Exam  Nursing note and vitals reviewed. Constitutional: She is oriented to person, place, and time. She appears well-developed and well-nourished. No distress.  HENT:  Head: Normocephalic and atraumatic.  Eyes: Conjunctivae and EOM are normal.   Cardiovascular: Normal rate and regular rhythm.   Pulmonary/Chest: Effort normal and breath sounds normal. No stridor. No respiratory distress.  Abdominal: She exhibits no distension.  Musculoskeletal: She exhibits no edema.  Neurological: She is alert and oriented to person, place, and time. No cranial nerve deficit.  Skin: Skin is warm and dry.  Psychiatric: Her speech is delayed. She is slowed and withdrawn. Cognition and memory are impaired. She exhibits abnormal recent memory and abnormal remote memory.  Patient denies currently seeing anything abnormal    ED Course  Procedures (including critical care time)  Labs Reviewed  COMPREHENSIVE METABOLIC PANEL - Abnormal; Notable for the following:    CO2 33 (*)    Albumin 3.3 (*)    GFR calc non Af Amer 48 (*)    GFR calc Af Amer 56 (*)    All other components within normal limits  CBC WITH DIFFERENTIAL - Abnormal; Notable for the following:    Platelets 96 (*)    All other components within normal limits  URINALYSIS, ROUTINE W REFLEX MICROSCOPIC  LACTIC ACID, PLASMA   No results found.   No diagnosis found.  Pulse ox 90% room air normal  History      CSN: [id number]   Arrival date & time 01/04/13  1441    First MD Initiated Contact with Patient 01/04/13 1533         Chief Complaint   Patient presents with   .  Altered Mental Status      (Consider location/radiation/quality/duration/timing/severity/associated sxs/prior treatment) HPI    Past Medical History   Diagnosis  Date   .  Anxiety     .  HTN (hypertension)     .  Atrial fibrillation     .  Dementia     .  Tremor     .  Osteoporosis         prev intolerant of alendronate   .  Myocardial infarction     .  Anemia     .  Hypertension     .  Closed left hip fracture     .  Arthritis     .  DEMENTIA     .  Pneumonia     .  Hypothyroid     .  Hypothyroidism     .  Depression         Past Surgical History   Procedure  Laterality  Date   .   Head surgery           for trigeminal neuralgia   .  Total hip arthroplasty           right   .  Cataract extraction       .  Appendectomy       .  Cataract extraction, bilateral       .  Rotator cuff repair           left   .  Closed reduction humerus fracture           left       Family History   Problem  Relation  Age of Onset   .  Cancer       .  Cancer  Sister         breast cancer   .  Cancer  Brother         lung cancer       History   Substance Use Topics   .  Smoking status:  Never Smoker    .  Smokeless tobacco:  Never Used   .  Alcohol Use:  No       OB History     Grav  Para  Term  Preterm  Abortions  TAB  SAB  Ect  Mult  Living                                Review of Systems    Allergies    Alendronate sodium; Aricept; and Warfarin sodium    Home Medications       Current Outpatient Rx   Name    Route    Sig    Dispense    Refill   .  buPROPion (WELLBUTRIN) 100 MG tablet     Oral     Take 100 mg by mouth every morning.                  .  Calcium Carbonate-Vitamin D (OYSTER SHELL CALCIUM 500 + D PO)     Oral     Take 1 tablet by mouth every evening.                  .  cholecalciferol (VITAMIN D) 400 UNITS TABS     Oral     Take 400 Units by mouth 2 (two) times daily.                .  digoxin (LANOXIN) 0.125 MG tablet     Oral     Take 125 mcg by mouth every morning.                  .  docusate sodium (COLACE) 100 MG  capsule     Oral     Take 100 mg by mouth every evening.                .  feeding supplement (ENSURE COMPLETE) LIQD     Oral     Take 237 mLs by mouth 2 (two) times daily between meals.                .  ferrous sulfate 325 (65 FE) MG tablet     Oral     Take 325 mg by mouth 2 (two) times daily.                   .  furosemide (LASIX) 20 MG tablet     Oral     Take 1 tablet (20 mg total) by mouth See admin instructions. Take daily if needed for swelling     30 tablet     1         Patient needs to make an appointment to receive fu ...    .  levothyroxine (SYNTHROID, LEVOTHROID) 50 MCG tablet     Oral     Take 1 tablet (50 mcg total) by mouth daily. 6 days a week, not on Sundays     90 tablet     1    .  metoprolol tartrate (LOPRESSOR) 25 MG tablet           Take 1/2 tab let by mouth twice a day     90  tablet     0    .  Multiple Vitamin (MULTIVITAMIN) tablet     Oral     Take 1 tablet by mouth every morning.                  .  senna (SENOKOT) 8.6 MG TABS     Oral     Take 1 tablet by mouth every evening.                .  sertraline (ZOLOFT) 25 MG tablet     Oral     Take 25 mg by mouth every morning.                     BP 110/54  Pulse 67  Temp(Src) 96.5 F (35.8 C) (Axillary)  Resp 18  SpO2 100%   Physical Exam    ED Course    BLADDER CATHETERIZATION Date/Time: 01/04/2013 4:02 PM Performed by: Gerhard Munch Authorized by: Gerhard Munch Consent: Verbal consent obtained. The procedure was performed in an emergent situation. Risks and benefits: risks, benefits and alternatives were discussed Consent given by: power of attorney and patient Patient identity confirmed: verbally with patient Time out: Immediately prior to procedure a "time out" was called to verify the correct patient, procedure, equipment, support staff and site/side marked as required. Indications: urine specimen collection Local anesthesia used: no Patient sedated: no Catheter insertion: temporary indwelling Catheter size: 14 Fr Complicated insertion: no Altered anatomy: no Bladder irrigation: no Number of attempts: 1 Urine volume: 200 ml Urine characteristics: yellow Patient tolerance: Patient tolerated the procedure well with no immediate complications.  (including critical care time)      I reviewed all labs with the patient's son, including the negative urinalysis.   MDM  This pleasant elderly female presents with familial concerns over  increased confusion, visual hallucination.  On exam the patient is in no distress with unremarkable vital signs.  The patient denies current visual hallucination, or any complaints.  The patient's vital signs, labs history of dementia are all reassuring.  Absent evidence of trauma, and with no unilateral neurologic changes, there is indication for imaging.  The patient was discharged in stable condition with PMD followup    Gerhard Munch, MD 01/04/13 Harrietta Guardian  Gerhard Munch, MD 01/05/13 0001

## 2013-01-04 NOTE — ED Notes (Signed)
Per son 3 weeks similar symptoms in regards to confusion (i.e visual hallucination). The last episode of having to come to the ED was 3 weeks ago. Patient passed out while on the commode, hypotensive, & dx UTI @  with these symptoms patient was admitted @ [facility] stayed for 3 days. No change with eating or drinking per care giver (son stated he checked with care giver's). No vomiting, no diarrhea, no fever, coughing.  Today son called Dr.'s office spoked to Dr's office triage nurse & instructed to come to the ED. Son was going to take his Mother to Dr's office tomorrow, however Dr's office triage nurse stated with visual hallucination there would be no need to come to the Dr's office.

## 2013-01-04 NOTE — ED Notes (Signed)
Pt's son states that pt has been more confused than normal.  States she is seeing things that aren't there.  States that "it hurts a little bit when she goes to the bathroom".  Pt is unreliable historian.  Hx of UTIs.

## 2013-01-05 ENCOUNTER — Telehealth: Payer: Self-pay | Admitting: Family Medicine

## 2013-01-05 NOTE — Telephone Encounter (Signed)
[facility] Triage Call Report Triage Record Num: [id number] Operator: Felix Ahmadi Patient Name: [name] Call Date & Time: 01/04/2013 1:36:25PM Patient Phone: [phone] PCP: [name] Patient Gender: Female PCP Fax : Patient DOB: March 22, 1922 Practice Name: [facility] Reason for Call: Caller: [name]/Other; PCP: [name] Clelia Croft) [facility]); CB#: [phone]; Call regarding Confusion; Onset 2/8. Son reporting that she is confused and disorientation. He states that she has histoy of same presentation as past UTI. Unknown fever. Emergent sxs per 'Confusion' and 'Urinary symptoms' protocol. Son informed that pt should be seen in ED. Son agreed. He will take her by car. Protocol(s) Used: Urinary Symptoms - Female Recommended Outcome per Protocol: See Provider within 4 hours Reason for Outcome: Urinary tract symptoms AND fever 101.5 F (38.6 C) or higher or vomiting Care Advice: ~ 01/04/2013 1:51:47PM Page 1 of 1 [id number]

## 2013-01-28 ENCOUNTER — Telehealth: Payer: Self-pay

## 2013-01-28 ENCOUNTER — Encounter: Payer: Self-pay | Admitting: *Deleted

## 2013-01-28 MED ORDER — DIGOXIN 125 MCG PO TABS: 125.0000 ug | ORAL_TABLET | Freq: Every morning | ORAL | Status: DC

## 2013-01-28 NOTE — Telephone Encounter (Signed)
..   Requested Prescriptions   Pending Prescriptions Disp Refills  . digoxin (LANOXIN) 0.125 MG tablet 30 tablet 0    Sig: Take 1 tablet (125 mcg total) by mouth every morning.  Marland Kitchen.Patient needs to contact office to schedule  Appointment  for future refills.Ph:[phone]. Thank you.

## 2013-02-10 ENCOUNTER — Other Ambulatory Visit: Payer: Self-pay | Admitting: *Deleted

## 2013-02-10 MED ORDER — METOPROLOL TARTRATE 25 MG PO TABS: ORAL_TABLET | ORAL | Status: DC

## 2013-03-23 ENCOUNTER — Telehealth: Payer: Self-pay

## 2013-03-23 NOTE — Telephone Encounter (Signed)
pts son Chrissie Noa request call back to get info on getting a hospital bed in pts home. Advised pt unless wants to pay out of pocket would need to ck with pts insurance co for coverage of that type medical equipment and to see if pt would need a face to face office visit to get order and if forms from company are needed to be filled out. Pt has 24/7 caregivers and now caregivers are beginning to experience more difficulty getting pt into and out of bed.Chrissie Noa will call back after cking with insurance co.

## 2013-03-27 ENCOUNTER — Ambulatory Visit (INDEPENDENT_AMBULATORY_CARE_PROVIDER_SITE_OTHER): Payer: Medicare Other | Admitting: Family Medicine

## 2013-03-27 ENCOUNTER — Encounter: Payer: Self-pay | Admitting: Family Medicine

## 2013-03-27 VITALS — BP 122/62 | HR 81 | Temp 97.9°F

## 2013-03-27 DIAGNOSIS — F0390 Unspecified dementia without behavioral disturbance: Secondary | ICD-10-CM

## 2013-03-27 NOTE — Patient Instructions (Addendum)
Let us know what company you want to try to use for the bed.  I would consider asking hospice make an evaluation and see if [name] is eligible.   I'll work on the papers for the bed in the meantime.   Take care.

## 2013-03-29 ENCOUNTER — Encounter: Payer: Self-pay | Admitting: Family Medicine

## 2013-03-29 NOTE — Assessment & Plan Note (Signed)
Now with diffuse weakness.  She needs bed with adjustable height to aid getting in and out of bed with max assist from others. Needs this to prevent injury with transfer and to allow her out of bed to try to prevent bed sores.  I'll await information from the family about preferred company.  I asked family to look into hospice criteria. >25 min spent with face to face with patient, >50% counseling and/or coordinating care.

## 2013-03-29 NOTE — Progress Notes (Signed)
H/o dementia.  Progressive per family.  Less talking.  Minimal walking, has become diffusely weak.  Needs help with feeding, cannot get in and out of bed at all.  Full assist with transfers.  She had PT at home prev, with minimal effect per family.  She has bedside commode, etc.  Family asks about bed options.    PMH and SH reviewed  ROS: See HPI, otherwise noncontributory.  Meds, vitals, and allergies reviewed.   nad Not oriented, pleasant in conversation.   Mmm IRR ctab She is diffusely weak- 3/5 in the BUE, weaker in BLE.  She can't attempt to stand, even with pushing off with hands.   DTRs intact

## 2013-04-02 ENCOUNTER — Telehealth: Payer: Self-pay

## 2013-04-02 NOTE — Telephone Encounter (Signed)
pts son, Chrissie Noa left v/m; seen 03/27/13 and to call back with info for provider for hospital bed; wants to use [facility], [address] [place], phone # [phone]. Chrissie Noa request call back.

## 2013-04-02 NOTE — Telephone Encounter (Signed)
Please send order for electric bed that can be raised and lowered electrically to facilitate assisting patient in and out of bed.  Have them send the papers over for me to fill out.  Thanks.

## 2013-04-03 ENCOUNTER — Telehealth: Payer: Self-pay | Admitting: *Deleted

## 2013-04-03 ENCOUNTER — Encounter: Payer: Self-pay | Admitting: *Deleted

## 2013-04-03 NOTE — Telephone Encounter (Signed)
Jasmine December with [facility] left v/m; received request for pt but needs demographic sheet for pt contact # faxed to [phone]. Demographic sheet faxed as requested.

## 2013-04-03 NOTE — Telephone Encounter (Signed)
Cathy from [facility] advised.

## 2013-04-03 NOTE — Telephone Encounter (Signed)
Please give the verbal.  I'll attend.  I would like their help.  Thanks.

## 2013-04-03 NOTE — Telephone Encounter (Signed)
Please give an order for Hospice Assessment, verbal is fine.  They also need to know if you will be the attending MD with Hospice for this patient and if so, would you like their MD's to help with symptom management?

## 2013-04-03 NOTE — Telephone Encounter (Signed)
Order done.  [facility] says they need a copy of the patient's exam indicating the reasons that the bed is needed.  All info faxed to [facility] at [phone].

## 2013-04-06 ENCOUNTER — Telehealth: Payer: Self-pay | Admitting: Family Medicine

## 2013-04-06 DIAGNOSIS — F0390 Unspecified dementia without behavioral disturbance: Secondary | ICD-10-CM

## 2013-04-06 NOTE — Telephone Encounter (Signed)
Call from Dr. Elwin Sleight with Hospice.  Pt didn't meet eligibility requirements now but Hospice will recheck with patient in about 3 months.  She may qualify at that point.  I appreciate the help and the call.

## 2013-04-07 ENCOUNTER — Other Ambulatory Visit: Payer: Self-pay

## 2013-04-07 MED ORDER — FUROSEMIDE 20 MG PO TABS: 20.0000 mg | ORAL_TABLET | ORAL | Status: DC

## 2013-04-07 NOTE — Telephone Encounter (Signed)
..   Requested Prescriptions   Pending Prescriptions Disp Refills  . furosemide (LASIX) 20 MG tablet 60 tablet 1    Sig: Take 1 tablet (20 mg total) by mouth 3 (three) times a week. Take daily if needed for swelling

## 2013-04-22 ENCOUNTER — Other Ambulatory Visit: Payer: Self-pay | Admitting: Family Medicine

## 2013-04-22 MED ORDER — LEVOTHYROXINE SODIUM 50 MCG PO TABS: 50.0000 ug | ORAL_TABLET | Freq: Every day | ORAL | Status: DC

## 2013-05-05 ENCOUNTER — Telehealth: Payer: Self-pay

## 2013-05-05 DIAGNOSIS — Z9181 History of falling: Secondary | ICD-10-CM

## 2013-05-05 NOTE — Telephone Encounter (Signed)
Referral placed for [facility].  Getting the used hospital bed was reasonable.

## 2013-05-05 NOTE — Telephone Encounter (Signed)
[name] advised  

## 2013-05-05 NOTE — Telephone Encounter (Signed)
Chrissie Noa pts son request order for home health services; Chrissie Noa spoke with Earlie Counts told pt is eligible for home health services for 35 hrs/week for in home assistance. Chrissie Noa said presently paying 2 care givers to be with pt and funds are running out.Pt cannot stand or walk without assistance. Chrissie Noa said pt recently seen and hopes referral can be done without bringing pt into office. Chrissie Noa also wanted Dr Para March to know that pts request for hospital bed was denied; Chrissie Noa said [facility] told him that to get hospital bed approval pt must have respiratory issues. Chrissie Noa purchased used hospital bed and request if any help with payment of that to let him know. [name] request cb.Please advise.

## 2013-05-12 ENCOUNTER — Telehealth: Payer: Self-pay

## 2013-05-12 NOTE — Telephone Encounter (Signed)
Nurse from [facility] left v/m; did assessment for pt and there are no skilled nursing needs at this time. PT will do assessment on 05/12/13. No call back needed unless Dr Para March has comment or questions.

## 2013-05-13 NOTE — Telephone Encounter (Signed)
Noted, thanks!

## 2013-05-20 DIAGNOSIS — I4891 Unspecified atrial fibrillation: Secondary | ICD-10-CM

## 2013-05-20 DIAGNOSIS — IMO0001 Reserved for inherently not codable concepts without codable children: Secondary | ICD-10-CM

## 2013-05-20 DIAGNOSIS — F0390 Unspecified dementia without behavioral disturbance: Secondary | ICD-10-CM

## 2013-05-20 DIAGNOSIS — M4800 Spinal stenosis, site unspecified: Secondary | ICD-10-CM

## 2013-05-25 ENCOUNTER — Other Ambulatory Visit: Payer: Self-pay | Admitting: *Deleted

## 2013-05-25 MED ORDER — METOPROLOL TARTRATE 25 MG PO TABS: ORAL_TABLET | ORAL | Status: DC

## 2013-05-26 ENCOUNTER — Telehealth: Payer: Self-pay | Admitting: Family Medicine

## 2013-05-26 NOTE — Telephone Encounter (Signed)
[facility] dropped off FL2 form to be completed for Berry.  Please call Kendal Hymen when ready to pick up [phone].

## 2013-05-27 ENCOUNTER — Telehealth: Payer: Self-pay | Admitting: Family Medicine

## 2013-05-27 NOTE — Telephone Encounter (Signed)
I'll work on the hard copy.  

## 2013-05-27 NOTE — Telephone Encounter (Signed)
Louretta Parma from [facility] dropped off the [facility] Certification form for Ms. Nile Riggs again. She needs this redone/resigned in order for Medicare to support her homebound care. I have placed the old form in your inbox with highlighted areas that Sherrilyn Rist wanted to address. Her phone number is [phone]. Please call her with any questions and when its ready to be picked up.   Thanks, Revonda Standard

## 2013-05-28 NOTE — Telephone Encounter (Signed)
Sherrilyn Rist notified.  Folder left at front desk for pick up.

## 2013-05-28 NOTE — Telephone Encounter (Signed)
Tammy with [facility] left v/m requesting FL2 be faxed to [phone]. I was advised facility will need original form; spoke with Tammy and  Since no longer carbon copy can fax and some one may pick up FL2 at later time. Faxed as requested and left original at front desk for pick up.

## 2013-05-28 NOTE — Telephone Encounter (Signed)
Done. Thanks.

## 2013-06-04 ENCOUNTER — Telehealth: Payer: Self-pay | Admitting: *Deleted

## 2013-06-04 NOTE — Telephone Encounter (Signed)
[facility]  Sent a fax requesting the ok to crush pt's meds and put them in her applesauce. Is this ok?

## 2013-06-04 NOTE — Telephone Encounter (Signed)
Okay to do.  Thanks!

## 2013-06-08 ENCOUNTER — Telehealth: Payer: Self-pay

## 2013-06-08 ENCOUNTER — Other Ambulatory Visit: Payer: Self-pay | Admitting: Family Medicine

## 2013-06-08 NOTE — Telephone Encounter (Signed)
Mr Bissette left v/m requesting cb; no other info left. Left v/m for Mr Cheaney to cb/

## 2013-06-08 NOTE — Telephone Encounter (Signed)
Please give verbal order to move home bed in with patient, assuming the facility can accommodate, h/o falls.  Thanks.
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
## 2013-06-08 NOTE — Telephone Encounter (Signed)
Spoke with Mindy, nurse at [facility] checked with her supervisor and was told with Dr Lianne Bushy order it can be done. Pts son notified as instructed and Chrissie Noa will contact [facility] to firm details of moving bed to [facility].

## 2013-06-09 MED ORDER — FUROSEMIDE 20 MG PO TABS: 20.0000 mg | ORAL_TABLET | ORAL | Status: DC

## 2013-06-11 ENCOUNTER — Telehealth: Payer: Self-pay

## 2013-06-11 NOTE — Telephone Encounter (Signed)
Please give the verbal and change the med list.  Thanks.

## 2013-06-11 NOTE — Telephone Encounter (Signed)
Verbal given and meds list changed.

## 2013-06-11 NOTE — Telephone Encounter (Signed)
Merry Proud with [facility] left v/m; Tobi Bastos received fax to notify Dr Para March if swelling lower extremities; Lasix changed to 20 mg po Mon, Wed and Fri. Tobi Bastos said pt legs and feet so swollen could not get pt's shoes on this morning, Tobi Bastos request change to Lasix 20 mg to take 1 tab po daily.Please advise.

## 2013-06-18 ENCOUNTER — Telehealth: Payer: Self-pay | Admitting: *Deleted

## 2013-06-18 ENCOUNTER — Encounter: Payer: Self-pay | Admitting: Family Medicine

## 2013-06-18 DIAGNOSIS — Z66 Do not resuscitate: Secondary | ICD-10-CM | POA: Insufficient documentation

## 2013-06-18 MED ORDER — DOCUSATE SODIUM 50 MG/5ML PO LIQD: 100.0000 mg | Freq: Every day | ORAL | Status: DC

## 2013-06-18 NOTE — Telephone Encounter (Signed)
Faxed to Mindy.

## 2013-06-18 NOTE — Telephone Encounter (Signed)
Pt is unable to swallow colace 100 mg cap, is it ok for pt to take the liquid form? If so they need directions for med.

## 2013-06-18 NOTE — Telephone Encounter (Signed)
See med list for the change.  Please have the make the change to liquid.  Should be OTC.

## 2013-06-19 IMAGING — CR DG CHEST 1V PORT
1 series · 1 of 1 positions shown · non-contrast
Comparison: 10/09/2010; 09/21/2010; chest CT - 09/22/2010

CLINICAL DATA: Loss of consciousness, hypertension, dementia

PORTABLE CHEST - 1 VIEW

[AP]
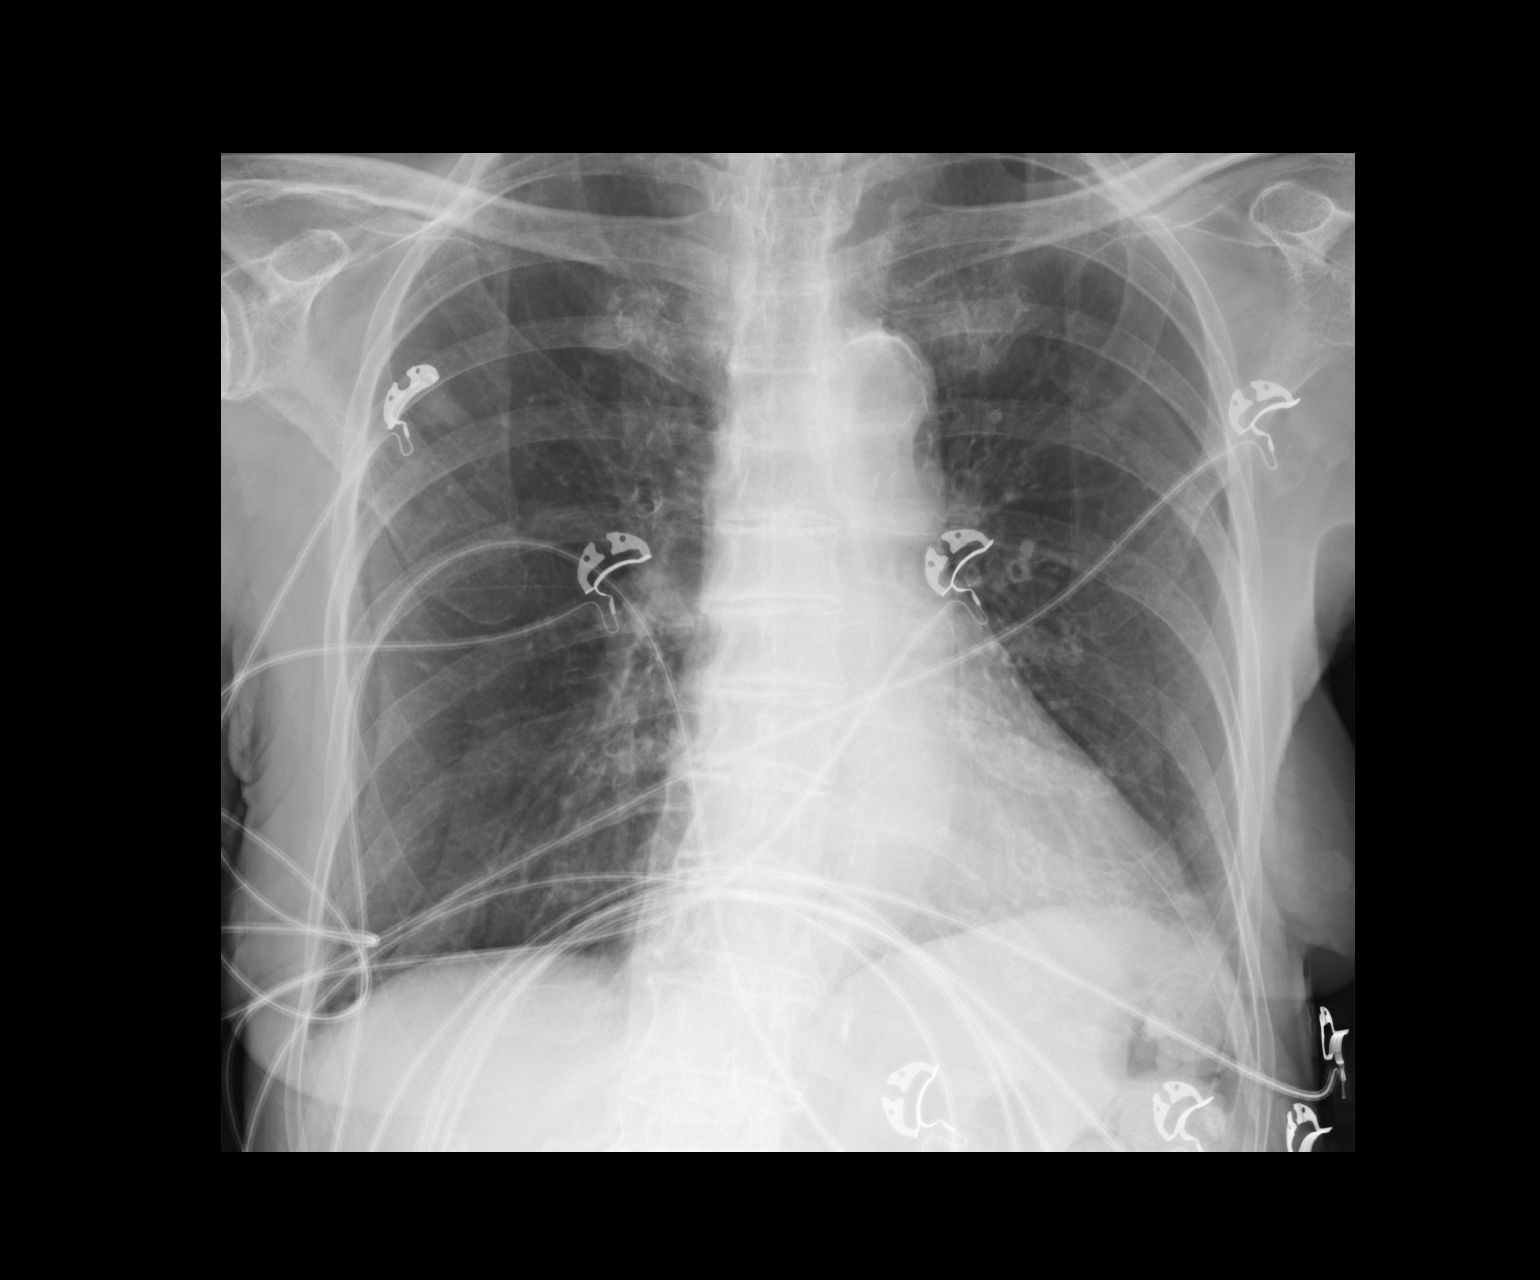

[1 of 1 positions shown; findings below may reference images not displayed]

FINDINGS: Grossly unchanged cardiac silhouette and mediastinal contours with
mild tortuosity the thoracic aorta.  The lungs remain hyperexpanded
with flattening of bilateral hemidiaphragms. Grossly unchanged left
basilar heterogeneous opacities favored to represent atelectasis or
scar.  No new focal airspace opacities.  No definite pleural
effusion or pneumothorax. Grossly unchanged bones.
IMPRESSION: 1.  Hyperexpanded lungs without definite acute cardiopulmonary
disease.
2.  Chronic left basilar opacities favored to represent atelectasis
or scar.

## 2013-07-13 ENCOUNTER — Other Ambulatory Visit: Payer: Self-pay | Admitting: *Deleted

## 2013-07-13 MED ORDER — CALCIUM CARBONATE-VITAMIN D 500-200 MG-UNIT PO TABS: 1.0000 | ORAL_TABLET | Freq: Every evening | ORAL | Status: DC

## 2013-07-28 ENCOUNTER — Other Ambulatory Visit: Payer: Self-pay | Admitting: *Deleted

## 2013-07-28 MED ORDER — CHOLECALCIFEROL 10 MCG (400 UNIT) PO TABS: 400.0000 [IU] | ORAL_TABLET | Freq: Two times a day (BID) | ORAL | Status: DC

## 2013-07-28 NOTE — Telephone Encounter (Signed)
Received faxed refill request from pharmacy. Refill sent to pharmacy electronically. 

## 2013-07-30 ENCOUNTER — Other Ambulatory Visit: Payer: Self-pay | Admitting: *Deleted

## 2013-07-30 MED ORDER — METOPROLOL TARTRATE 25 MG PO TABS: ORAL_TABLET | ORAL | Status: DC

## 2013-08-04 ENCOUNTER — Other Ambulatory Visit: Payer: Self-pay | Admitting: *Deleted

## 2013-08-04 MED ORDER — BUPROPION HCL 100 MG PO TABS: 100.0000 mg | ORAL_TABLET | Freq: Every morning | ORAL | Status: DC

## 2013-08-06 NOTE — Telephone Encounter (Signed)
She has not been seen in the office since 2012 and will need an appt with [place] prior to further refills.

## 2013-08-08 NOTE — Telephone Encounter (Signed)
I saw pt in in 5/14.  Please verify her current med list, including the digoxin.  Let me know about that.  Let me now if they can draw labs at the facility.  We'll go from there.  We'll likely need to get a digoxin level and possibly a few other labs.  I may be able to fill this.   Thanks.

## 2013-08-10 ENCOUNTER — Encounter: Payer: Self-pay | Admitting: *Deleted

## 2013-08-10 NOTE — Telephone Encounter (Signed)
Received refill request electronically. Last office visit 03/27/13. Is it okay to refill medication? 

## 2013-08-10 NOTE — Telephone Encounter (Signed)
See prev note

## 2013-08-10 NOTE — Telephone Encounter (Signed)
Faxed to facility for info requested.

## 2013-08-11 NOTE — Telephone Encounter (Signed)
Info received from facility.  Order for [facility] faxed to facility.  Awaiting results to fill previous Rx.

## 2013-08-12 ENCOUNTER — Telehealth: Payer: Self-pay

## 2013-08-12 MED ORDER — SULFAMETHOXAZOLE-TMP DS 800-160 MG PO TABS: 1.0000 | ORAL_TABLET | Freq: Two times a day (BID) | ORAL | Status: DC

## 2013-08-12 NOTE — Telephone Encounter (Signed)
[name] from [facility] in [place] left v/m for ICD 9 coding for labs ordered by Dr Para March for TSH, digoxin,CBC and BMET. [name] said pt has had increase in confusion for last 2 days and request order for U/A and C/S faxed to [phone].Please advise.

## 2013-08-12 NOTE — Telephone Encounter (Signed)
Faxed as instructed. Confirmed with Lelon Mast that she received this.

## 2013-08-12 NOTE — Telephone Encounter (Signed)
ICD 427.31 for A fib for the TSH, digoxin,CBC and BMET  ICD 599.0 for UTI for the u/a and ucx.    Please send this as a fax.  Start septra DS 1 po bid x5 days after urine collected.    Rx sent.

## 2013-08-17 ENCOUNTER — Encounter: Payer: Self-pay | Admitting: Family Medicine

## 2013-08-17 ENCOUNTER — Other Ambulatory Visit: Payer: Self-pay | Admitting: *Deleted

## 2013-08-17 LAB — TSH
BUN: 17 mg/dL (ref 4–21)
Creat: 0.81
Digoxin level, serum: 1
Hemoglobin - METHGB: 12.3

## 2013-08-17 MED ORDER — DIGOXIN 125 MCG PO TABS: 125.0000 ug | ORAL_TABLET | Freq: Every morning | ORAL | Status: DC

## 2013-08-17 NOTE — Telephone Encounter (Signed)
Labs received.  Previous Rx refilled per [facility].

## 2013-09-08 ENCOUNTER — Other Ambulatory Visit: Payer: Self-pay | Admitting: *Deleted

## 2013-09-08 MED ORDER — FERROUS SULFATE 325 (65 FE) MG PO TABS: 325.0000 mg | ORAL_TABLET | Freq: Two times a day (BID) | ORAL | Status: DC

## 2013-09-08 MED ORDER — FUROSEMIDE 20 MG PO TABS: 20.0000 mg | ORAL_TABLET | Freq: Every day | ORAL | Status: DC

## 2013-09-08 MED ORDER — SERTRALINE HCL 25 MG PO TABS: 25.0000 mg | ORAL_TABLET | Freq: Every morning | ORAL | Status: DC

## 2013-09-11 ENCOUNTER — Encounter: Payer: Self-pay | Admitting: Family Medicine

## 2013-09-21 ENCOUNTER — Other Ambulatory Visit: Payer: Self-pay | Admitting: *Deleted

## 2013-09-21 MED ORDER — LEVOTHYROXINE SODIUM 50 MCG PO TABS: 50.0000 ug | ORAL_TABLET | Freq: Every day | ORAL | Status: DC

## 2013-10-02 ENCOUNTER — Other Ambulatory Visit: Payer: Self-pay | Admitting: *Deleted

## 2013-10-02 MED ORDER — DOCUSATE SODIUM 50 MG/5ML PO LIQD: 100.0000 mg | Freq: Every day | ORAL | Status: DC

## 2013-10-06 ENCOUNTER — Other Ambulatory Visit: Payer: Self-pay | Admitting: *Deleted

## 2013-10-06 MED ORDER — ONE-DAILY MULTI VITAMINS PO TABS: 1.0000 | ORAL_TABLET | Freq: Every morning | ORAL | Status: DC

## 2013-10-27 ENCOUNTER — Other Ambulatory Visit: Payer: Self-pay | Admitting: *Deleted

## 2013-10-27 MED ORDER — METOPROLOL TARTRATE 25 MG PO TABS: ORAL_TABLET | ORAL | Status: DC

## 2013-11-03 ENCOUNTER — Other Ambulatory Visit: Payer: Self-pay | Admitting: *Deleted

## 2013-11-03 NOTE — Telephone Encounter (Signed)
Faxed refill request. Refill protocol states that if the patient has been seen in 6 months, we can refill for 3 months.  Patient seen in May.  Please advise.

## 2013-11-04 MED ORDER — SERTRALINE HCL 25 MG PO TABS: 25.0000 mg | ORAL_TABLET | Freq: Every morning | ORAL | Status: DC

## 2013-11-04 NOTE — Telephone Encounter (Signed)
Sent!

## 2013-11-27 ENCOUNTER — Other Ambulatory Visit: Payer: Self-pay | Admitting: *Deleted

## 2013-11-27 MED ORDER — SENNA 8.6 MG PO TABS: 1.0000 | ORAL_TABLET | Freq: Every evening | ORAL | Status: DC

## 2013-11-27 MED ORDER — DIGOXIN 125 MCG PO TABS: 125.0000 ug | ORAL_TABLET | Freq: Every morning | ORAL | Status: DC

## 2013-11-27 MED ORDER — BUPROPION HCL 100 MG PO TABS: 100.0000 mg | ORAL_TABLET | Freq: Every morning | ORAL | Status: DC

## 2013-11-30 ENCOUNTER — Inpatient Hospital Stay: Payer: Self-pay | Admitting: Specialist

## 2013-11-30 LAB — CBC
HCT: 42.8 % (ref 35.0–47.0)
HGB: 14.4 g/dL (ref 12.0–16.0)
MCH: 30.6 pg (ref 26.0–34.0)
MCHC: 33.7 g/dL (ref 32.0–36.0)
MCV: 91 fL (ref 80–100)
Platelet: 90 10*3/uL — ABNORMAL LOW (ref 150–440)
RBC: 4.71 10*6/uL (ref 3.80–5.20)
RDW: 14.2 % (ref 11.5–14.5)
WBC: 8.1 10*3/uL (ref 3.6–11.0)

## 2013-11-30 LAB — COMPREHENSIVE METABOLIC PANEL
ALK PHOS: 89 U/L
Albumin: 3.4 g/dL (ref 3.4–5.0)
Anion Gap: 4 — ABNORMAL LOW (ref 7–16)
BUN: 20 mg/dL — AB (ref 7–18)
Bilirubin,Total: 0.9 mg/dL (ref 0.2–1.0)
CREATININE: 0.92 mg/dL (ref 0.60–1.30)
Calcium, Total: 9.4 mg/dL (ref 8.5–10.1)
Chloride: 104 mmol/L (ref 98–107)
Co2: 29 mmol/L (ref 21–32)
EGFR (Non-African Amer.): 55 — ABNORMAL LOW
Glucose: 92 mg/dL (ref 65–99)
Osmolality: 276 (ref 275–301)
Potassium: 3.8 mmol/L (ref 3.5–5.1)
SGOT(AST): 21 U/L (ref 15–37)
SGPT (ALT): 25 U/L (ref 12–78)
Sodium: 137 mmol/L (ref 136–145)
Total Protein: 7.5 g/dL (ref 6.4–8.2)

## 2013-11-30 LAB — RAPID INFLUENZA A&B ANTIGENS

## 2013-11-30 LAB — URINALYSIS, COMPLETE
Bacteria: NONE SEEN
Bilirubin,UR: NEGATIVE
Blood: NEGATIVE
Glucose,UR: NEGATIVE mg/dL (ref 0–75)
Hyaline Cast: 1
Ketone: NEGATIVE
Leukocyte Esterase: NEGATIVE
Nitrite: NEGATIVE
Ph: 6 (ref 4.5–8.0)
Protein: 25
RBC,UR: 15 /HPF (ref 0–5)
Specific Gravity: 1.015 (ref 1.003–1.030)
Squamous Epithelial: <1
WBC UR: 1 /HPF (ref 0–5)

## 2013-11-30 LAB — DIGOXIN LEVEL: DIGOXIN: 1.14 ng/mL

## 2013-11-30 LAB — AMMONIA: Ammonia, Plasma: 23 mcmol/L (ref 11–32)

## 2013-11-30 LAB — TROPONIN I: Troponin-I: <0.02 ng/mL

## 2013-11-30 LAB — TSH: THYROID STIMULATING HORM: 0.544 u[IU]/mL

## 2013-12-01 LAB — PLATELET COUNT: Platelet: 79 10*3/uL — ABNORMAL LOW (ref 150–440)

## 2013-12-02 LAB — PLATELET COUNT: Platelet: 78 10*3/uL — ABNORMAL LOW (ref 150–440)

## 2013-12-10 ENCOUNTER — Ambulatory Visit: Payer: Medicare Other | Admitting: Internal Medicine

## 2013-12-10 DIAGNOSIS — Z0289 Encounter for other administrative examinations: Secondary | ICD-10-CM

## 2013-12-30 ENCOUNTER — Encounter: Payer: Self-pay | Admitting: Family Medicine

## 2013-12-30 ENCOUNTER — Ambulatory Visit (INDEPENDENT_AMBULATORY_CARE_PROVIDER_SITE_OTHER): Payer: Medicare Other | Admitting: Family Medicine

## 2013-12-30 VITALS — BP 120/70 | HR 101 | Temp 98.0°F | Wt 117.0 lb

## 2013-12-30 DIAGNOSIS — D696 Thrombocytopenia, unspecified: Secondary | ICD-10-CM

## 2013-12-30 DIAGNOSIS — L0291 Cutaneous abscess, unspecified: Secondary | ICD-10-CM

## 2013-12-30 DIAGNOSIS — L0390 Cellulitis, unspecified: Secondary | ICD-10-CM

## 2013-12-30 LAB — CBC WITH DIFFERENTIAL/PLATELET
BASOS PCT: 0.3 % (ref 0.0–3.0)
Basophils Absolute: 0 10*3/uL (ref 0.0–0.1)
Eosinophils Absolute: 0 10*3/uL (ref 0.0–0.7)
Eosinophils Relative: 0.1 % (ref 0.0–5.0)
HEMATOCRIT: 44.9 % (ref 36.0–46.0)
Hemoglobin: 14.8 g/dL (ref 12.0–15.0)
LYMPHS ABS: 1.3 10*3/uL (ref 0.7–4.0)
Lymphocytes Relative: 17.8 % (ref 12.0–46.0)
MCHC: 32.9 g/dL (ref 30.0–36.0)
MCV: 92.5 fl (ref 78.0–100.0)
MONO ABS: 0.5 10*3/uL (ref 0.1–1.0)
Monocytes Relative: 6.9 % (ref 3.0–12.0)
Neutro Abs: 5.7 10*3/uL (ref 1.4–7.7)
Neutrophils Relative %: 74.9 % (ref 43.0–77.0)
PLATELETS: 102 10*3/uL — AB (ref 150.0–400.0)
RBC: 4.86 Mil/uL (ref 3.87–5.11)
RDW: 14.4 % (ref 11.5–14.6)
WBC: 7.6 10*3/uL (ref 4.5–10.5)

## 2013-12-30 MED ORDER — LIDOCAINE-PRILOCAINE 2.5-2.5 % EX CREA: 1.0000 "application " | TOPICAL_CREAM | Freq: Three times a day (TID) | CUTANEOUS | Status: DC | PRN

## 2013-12-30 MED ORDER — DOXYCYCLINE HYCLATE 100 MG PO TABS: 100.0000 mg | ORAL_TABLET | Freq: Two times a day (BID) | ORAL | Status: DC

## 2013-12-30 NOTE — Patient Instructions (Signed)
1:45 tomorrow for the I&D.  Go to the lab on the way out.  Use the emla cream as needed in the meantime.  Take care.

## 2013-12-30 NOTE — Progress Notes (Signed)
Pre-visit discussion using our clinic review tool. No additional management support is needed unless otherwise documented below in the visit note.  Here today with family.  She doesn't recall the recent hospitalization.  Was inpatient with PNA, low platelets noted. Due for follow up.  Heme f/u was deferred as of this point.  Not SOB.  No fevers.   She feels well, back to baseline per family except for lesion in L axilla.  Sore.  Red, not draining yet to anyone's knowledge.    Meds, vitals, and allergies reviewed.   ROS: See HPI.  Otherwise, noncontributory.  nad Not oriented but calm No c/o except for L axilla pain rrr ctab abd soft 4cm fluctuant mass on the L anterior portion of the axilla. Appears superficial.  Tender and red

## 2013-12-31 ENCOUNTER — Telehealth: Payer: Self-pay

## 2013-12-31 ENCOUNTER — Encounter: Payer: Self-pay | Admitting: *Deleted

## 2013-12-31 ENCOUNTER — Encounter: Payer: Self-pay | Admitting: Family Medicine

## 2013-12-31 ENCOUNTER — Ambulatory Visit (INDEPENDENT_AMBULATORY_CARE_PROVIDER_SITE_OTHER): Payer: Medicare Other | Admitting: Family Medicine

## 2013-12-31 VITALS — HR 86 | Temp 100.1°F | Wt 117.0 lb

## 2013-12-31 DIAGNOSIS — D696 Thrombocytopenia, unspecified: Secondary | ICD-10-CM | POA: Insufficient documentation

## 2013-12-31 DIAGNOSIS — L0291 Cutaneous abscess, unspecified: Secondary | ICD-10-CM | POA: Insufficient documentation

## 2013-12-31 DIAGNOSIS — L0390 Cellulitis, unspecified: Secondary | ICD-10-CM

## 2013-12-31 NOTE — Telephone Encounter (Signed)
Please send order for tylenol 500mg .  2 tabs po tid prn pain.  Thanks.

## 2013-12-31 NOTE — Patient Instructions (Signed)
Starting Saturday AM, pull 6 inches of the internal packing and trim it off.  Leave a few inches out.  Pull another 6 inches and trim the end daily until all packing is removed.  Can cover with clean dressing in meantime.  The area may drain some in the meantime.  When all packing is removed, then you can clean the area with soap and water.  Call back if more pain, fever or spreading redness.  Take care.

## 2013-12-31 NOTE — Assessment & Plan Note (Signed)
Superficial, would not I&D until we est her plt level.  Start doxy today, use emla cream locally and return tomorrow for I&D.  That is likely safest option.  It may drain spontaneously in the meantime.  D/w pt's son.

## 2013-12-31 NOTE — Telephone Encounter (Signed)
Order faxed.

## 2013-12-31 NOTE — Telephone Encounter (Signed)
Mindy with [facility] left v/m; pt was seen this afternoon and pt is complaining with pain in arm; Mindy request cb about possible order for Tylenol; Mindy also said will need written order fax # [phone].

## 2013-12-31 NOTE — Progress Notes (Signed)
Pre-visit discussion using our clinic review tool. No additional management support is needed unless otherwise documented below in the visit note.  Low platelets, recheck up to ~100.  D/w son.  Similar to prev in EMR.  We agreed to hold on heme referral for now and recheck CBC in about 1 month. No bleeding.   I&D  Meds, vitals, and allergies reviewed.   Indication: suspect abscess  Pt complaints of: erythema, pain, swelling  Location:L upper arm, axilla  Size:4cm  Informed consent obtained from son- she can't consent due to memory loss.  Pt's son aware of risks not limited to but including infection, bleeding, damage to near by organs.  Prep: etoh/betadine  Anesthesia: 2%lidocaine with epi, good effect  Incision made with #11 blade  Would explored and loculations removed  Wound packed with ~24" of iodoform gauze  Tolerated well, no residual bleeding.

## 2014-01-01 ENCOUNTER — Ambulatory Visit: Payer: Medicare Other | Admitting: Family Medicine

## 2014-01-01 NOTE — Assessment & Plan Note (Signed)
Routine postprocedure instructions d/w pt- remove packing as instructed, keep area clean and bandaged, follow up if concerns/spreading erythema/pain.  No complications.

## 2014-01-01 NOTE — Assessment & Plan Note (Signed)
Recheck cbc in 1 month.

## 2014-01-25 ENCOUNTER — Other Ambulatory Visit: Payer: Self-pay | Admitting: *Deleted

## 2014-01-25 MED ORDER — SENNA 8.6 MG PO TABS: 1.0000 | ORAL_TABLET | Freq: Every evening | ORAL | Status: DC

## 2014-01-29 ENCOUNTER — Other Ambulatory Visit (INDEPENDENT_AMBULATORY_CARE_PROVIDER_SITE_OTHER): Payer: Medicare Other

## 2014-01-29 ENCOUNTER — Encounter: Payer: Self-pay | Admitting: Family Medicine

## 2014-01-29 ENCOUNTER — Ambulatory Visit (INDEPENDENT_AMBULATORY_CARE_PROVIDER_SITE_OTHER): Payer: Medicare Other | Admitting: Family Medicine

## 2014-01-29 VITALS — BP 114/70 | HR 83 | Temp 97.9°F

## 2014-01-29 DIAGNOSIS — L989 Disorder of the skin and subcutaneous tissue, unspecified: Secondary | ICD-10-CM

## 2014-01-29 DIAGNOSIS — D696 Thrombocytopenia, unspecified: Secondary | ICD-10-CM

## 2014-01-29 LAB — CBC WITH DIFFERENTIAL/PLATELET
Basophils Absolute: 0 10*3/uL (ref 0.0–0.1)
Basophils Relative: 0.2 % (ref 0.0–3.0)
EOS ABS: 0.1 10*3/uL (ref 0.0–0.7)
Eosinophils Relative: 1.3 % (ref 0.0–5.0)
HCT: 43.2 % (ref 36.0–46.0)
Hemoglobin: 14.4 g/dL (ref 12.0–15.0)
Lymphocytes Relative: 30 % (ref 12.0–46.0)
Lymphs Abs: 1.8 10*3/uL (ref 0.7–4.0)
MCHC: 33.3 g/dL (ref 30.0–36.0)
MCV: 91.5 fl (ref 78.0–100.0)
MONO ABS: 0.6 10*3/uL (ref 0.1–1.0)
Monocytes Relative: 9.6 % (ref 3.0–12.0)
NEUTROS PCT: 58.9 % (ref 43.0–77.0)
Neutro Abs: 3.6 10*3/uL (ref 1.4–7.7)
PLATELETS: 98 10*3/uL — AB (ref 150.0–400.0)
RBC: 4.72 Mil/uL (ref 3.87–5.11)
RDW: 14.6 % (ref 11.5–14.6)
WBC: 6.2 10*3/uL (ref 4.5–10.5)

## 2014-01-29 NOTE — Patient Instructions (Signed)
Shirlee LimerickMarion or Bonita QuinLinda will call about your referral.  Take care.

## 2014-01-29 NOTE — Progress Notes (Signed)
Pre visit review using our clinic review tool, if applicable. No additional management support is needed unless otherwise documented below in the visit note.  Prev I&D side w/o pain now.  Now with new lesion on R chest wall.  Not ttp.  No healing with topical tx.  Meds, vitals, and allergies reviewed.   ROS: See HPI.  Otherwise, noncontributory.  nad L axilla with healed I&D site, not ttp 2cm ulcerated superficial lesion on the R anterior chest wall

## 2014-01-31 DIAGNOSIS — L989 Disorder of the skin and subcutaneous tissue, unspecified: Secondary | ICD-10-CM | POA: Insufficient documentation

## 2014-01-31 NOTE — Assessment & Plan Note (Signed)
Concern for SCC, d/w pt and son.  If going to need full excision, I would prefer derm to do it.  Refer.  No charge due to brevity of encounter.  See notes on platelets.  She shouldn't have sig bleeding with that level of thrombocytopenia.

## 2014-02-01 ENCOUNTER — Other Ambulatory Visit: Payer: Self-pay | Admitting: *Deleted

## 2014-02-01 MED ORDER — ENSURE COMPLETE PO LIQD: 237.0000 mL | Freq: Two times a day (BID) | ORAL | Status: DC

## 2014-02-01 NOTE — Telephone Encounter (Signed)
Sent. Thanks.   

## 2014-02-01 NOTE — Telephone Encounter (Signed)
Received refill request for Ensure.Pharmacy directions were different that what we had on file. I verified it with her son, and she is using twice daily as in MR. Per our records you have never refilled before, is it ok to refill?

## 2014-02-05 ENCOUNTER — Other Ambulatory Visit: Payer: Medicare Other

## 2014-02-05 ENCOUNTER — Emergency Department: Payer: Self-pay | Admitting: Emergency Medicine

## 2014-02-05 LAB — CBC WITH DIFFERENTIAL/PLATELET
Basophil #: 0 10*3/uL (ref 0.0–0.1)
Basophil %: 1 %
Eosinophil #: 0.1 10*3/uL (ref 0.0–0.7)
Eosinophil %: 1.5 %
HCT: 40.1 % (ref 35.0–47.0)
HGB: 13.6 g/dL (ref 12.0–16.0)
LYMPHS ABS: 1.4 10*3/uL (ref 1.0–3.6)
Lymphocyte %: 31.7 %
MCH: 31.1 pg (ref 26.0–34.0)
MCHC: 34 g/dL (ref 32.0–36.0)
MCV: 92 fL (ref 80–100)
MONO ABS: 0.4 x10 3/mm (ref 0.2–0.9)
MONOS PCT: 9.8 %
NEUTROS PCT: 56 %
Neutrophil #: 2.5 10*3/uL (ref 1.4–6.5)
PLATELETS: 109 10*3/uL — AB (ref 150–440)
RBC: 4.38 10*6/uL (ref 3.80–5.20)
RDW: 14.8 % — ABNORMAL HIGH (ref 11.5–14.5)
WBC: 4.4 10*3/uL (ref 3.6–11.0)

## 2014-02-05 LAB — BASIC METABOLIC PANEL
Anion Gap: 3 — ABNORMAL LOW (ref 7–16)
BUN: 11 mg/dL (ref 7–18)
CALCIUM: 9 mg/dL (ref 8.5–10.1)
CREATININE: 0.82 mg/dL (ref 0.60–1.30)
Chloride: 104 mmol/L (ref 98–107)
Co2: 33 mmol/L — ABNORMAL HIGH (ref 21–32)
EGFR (African American): >60
Glucose: 78 mg/dL (ref 65–99)
OSMOLALITY: 278 (ref 275–301)
POTASSIUM: 4.1 mmol/L (ref 3.5–5.1)
Sodium: 140 mmol/L (ref 136–145)

## 2014-02-15 ENCOUNTER — Other Ambulatory Visit: Payer: Self-pay | Admitting: *Deleted

## 2014-02-15 MED ORDER — NUTRITIONAL SUPPLEMENT PO LIQD: ORAL | Status: DC

## 2014-02-18 ENCOUNTER — Other Ambulatory Visit: Payer: Self-pay | Admitting: *Deleted

## 2014-02-18 MED ORDER — CHOLECALCIFEROL 10 MCG (400 UNIT) PO TABS: 400.0000 [IU] | ORAL_TABLET | Freq: Two times a day (BID) | ORAL | Status: DC

## 2014-02-22 ENCOUNTER — Other Ambulatory Visit: Payer: Self-pay | Admitting: *Deleted

## 2014-02-22 DIAGNOSIS — E039 Hypothyroidism, unspecified: Secondary | ICD-10-CM

## 2014-02-22 MED ORDER — BUPROPION HCL 100 MG PO TABS: 100.0000 mg | ORAL_TABLET | Freq: Every morning | ORAL | Status: DC

## 2014-02-22 MED ORDER — DIGOXIN 125 MCG PO TABS: 125.0000 ug | ORAL_TABLET | Freq: Every morning | ORAL | Status: DC

## 2014-02-22 MED ORDER — DOCUSATE SODIUM 150 MG/15ML PO LIQD: ORAL | Status: DC

## 2014-03-09 ENCOUNTER — Ambulatory Visit (INDEPENDENT_AMBULATORY_CARE_PROVIDER_SITE_OTHER): Payer: Medicare Other | Admitting: Family Medicine

## 2014-03-09 ENCOUNTER — Encounter: Payer: Self-pay | Admitting: Family Medicine

## 2014-03-09 VITALS — BP 92/54 | HR 77 | Temp 97.9°F

## 2014-03-09 DIAGNOSIS — F0390 Unspecified dementia without behavioral disturbance: Secondary | ICD-10-CM

## 2014-03-09 MED ORDER — NUTRITIONAL SUPPLEMENT PO LIQD: ORAL | Status: DC

## 2014-03-09 MED ORDER — DIGOXIN 125 MCG PO TABS: 125.0000 ug | ORAL_TABLET | Freq: Every morning | ORAL | Status: DC

## 2014-03-09 NOTE — Patient Instructions (Signed)
I wouldn't change anything for now.  Please update me as needed. Take care.

## 2014-03-09 NOTE — Progress Notes (Signed)
Pre visit review using our clinic review tool, if applicable. No additional management support is needed unless otherwise documented below in the visit note.  AMS yesterday and the day before.  Back to baseline now. She was tired, fatigued, talking abnormally, was acting like she was feeding herself but there wasn't a fork or food and then chewing on imaginary food.  No fevers.  No cough, SOB, BLE edema now (has this episodically), diarrhea, vomiting.  No known painful source.  The skin cancer site is healing up after removal.    Meds, vitals, and allergies reviewed.   ROS: See HPI.  Otherwise, noncontributory.  Nad, pleasant but demented.  Follows commands, appropriate when I told her I was glad to see her.  ncat Mmm IRR, not tachy ctab abd soft Ext w/o edema CN 2-12 wnl B, S/S/DTR wnl x4 with variable hand tremor noted.

## 2014-03-10 ENCOUNTER — Ambulatory Visit: Payer: Medicare Other | Admitting: Family Medicine

## 2014-03-10 NOTE — Assessment & Plan Note (Signed)
D/w pt.  Resolved acute changes.  Possibly due to dementia or another acute cause, ie CVA/infection.  Back to baseline now, he would like to avoid invasive testing and this is reasonable as it could worsen her confusion.  He declined adding ASA given her bleeding/fall risk. Would continue current meds.  W/o dysuria or chest sx, wouldn't start tx for presumed UTI or pulmonary source.  Follow clinically.  He agrees.

## 2014-03-15 ENCOUNTER — Other Ambulatory Visit: Payer: Self-pay | Admitting: *Deleted

## 2014-03-15 MED ORDER — LEVOTHYROXINE SODIUM 50 MCG PO TABS: 50.0000 ug | ORAL_TABLET | Freq: Every day | ORAL | Status: DC

## 2014-04-02 ENCOUNTER — Other Ambulatory Visit: Payer: Self-pay | Admitting: *Deleted

## 2014-04-02 MED ORDER — METOPROLOL TARTRATE 25 MG PO TABS: ORAL_TABLET | ORAL | Status: DC

## 2014-04-05 ENCOUNTER — Other Ambulatory Visit: Payer: Self-pay | Admitting: *Deleted

## 2014-04-05 MED ORDER — FUROSEMIDE 20 MG PO TABS: 20.0000 mg | ORAL_TABLET | Freq: Every day | ORAL | Status: DC

## 2014-04-11 ENCOUNTER — Emergency Department: Payer: Self-pay | Admitting: Emergency Medicine

## 2014-04-12 ENCOUNTER — Telehealth: Payer: Self-pay | Admitting: *Deleted

## 2014-04-12 MED ORDER — FERROUS SULFATE 325 (65 FE) MG PO TABS: 325.0000 mg | ORAL_TABLET | Freq: Every day | ORAL | Status: DC

## 2014-04-12 NOTE — Telephone Encounter (Signed)
Verify with patient- I thought she was on BID dosing.  If so, continue as is.  If she has been on once daily dosing, then continue with that.  Please send 90 day supply with 3 refills.  Thanks.

## 2014-04-12 NOTE — Telephone Encounter (Signed)
Spoke to [place] with [facility] and was advised that patient has been taking her Ferrous sulfate once a day. Refill sent to pharmacy as instructed.

## 2014-04-12 NOTE — Telephone Encounter (Signed)
Received faxed refill request from pharmacy for Ferrous Sulfate 325 mg, take one by mouth daily.  Request does not match the medication list which shows two times a day. Please advise.

## 2014-04-26 ENCOUNTER — Other Ambulatory Visit: Payer: Self-pay | Admitting: Family Medicine

## 2014-04-26 ENCOUNTER — Encounter: Payer: Self-pay | Admitting: Family Medicine

## 2014-04-26 ENCOUNTER — Ambulatory Visit (INDEPENDENT_AMBULATORY_CARE_PROVIDER_SITE_OTHER): Payer: Medicare Other | Admitting: Family Medicine

## 2014-04-26 VITALS — BP 120/74 | HR 60 | Temp 97.4°F

## 2014-04-26 DIAGNOSIS — M25569 Pain in unspecified knee: Secondary | ICD-10-CM

## 2014-04-26 MED ORDER — CALCIUM CARBONATE-VITAMIN D 500-200 MG-UNIT PO TABS: 1.0000 | ORAL_TABLET | Freq: Every evening | ORAL | Status: DC

## 2014-04-26 MED ORDER — ACETAMINOPHEN 500 MG PO TABS: 1000.0000 mg | ORAL_TABLET | Freq: Three times a day (TID) | ORAL | Status: DC

## 2014-04-26 NOTE — Telephone Encounter (Signed)
Received faxed refill request from pharmacy. Refill sent to pharmacy electronically. 

## 2014-04-26 NOTE — Assessment & Plan Note (Signed)
At this point, imaging wouldn't likely change mgmt.  She wouldn't be a surgical candidate and event with a fx (low probability of such), she likely would not tolerate bracing. Would continue with assist on transfers, elevate/extend legs as tolerated in a recliner, ice her knee and schedule tylenol.  D/w pt's son.  He agrees.

## 2014-04-26 NOTE — Progress Notes (Signed)
Pre visit review using our clinic review tool, if applicable. No additional management support is needed unless otherwise documented below in the visit note.  She fell out of the wheelchair, was leaning forward to try to pick something off the floor.  This was weeks ago.  Later on it was noted that she was having knee pain (likely L) on standing.  Mentation has been near baseline, slightly more withdrawn, but nothing with profound AMS as preceded the last OV.    Prior to this, she could bear weight to transfer with assistance but wasn't walking at baseline.     Meds, vitals, and allergies reviewed.   ROS: See HPI.  Otherwise, noncontributory.  nad In wheelchair.  Demented, unable to follow more than 1 step commands, but pleasant IRR ctab abd soft B knees not ttp at the joint lines but L knee is puffy w/o bruising.  ttp in the L popliteal fossa but calf not ttp.  No erythema.  Unable to bear weight, unclear if from pain and/or deconditioning.  Unable to fully ext the L knee w/o some discomfort, possibly from hamstring tightness.

## 2014-04-26 NOTE — Patient Instructions (Signed)
Tylenol 500mg , 2 tabs by mouth 3 times a day.  Ice pack, alternating 5 minutes on/39minutes off for 20 minutes at a time, 3 times a day for 1 week.  Call back as needed.   Take care.

## 2014-05-24 ENCOUNTER — Other Ambulatory Visit: Payer: Self-pay

## 2014-05-24 DIAGNOSIS — F329 Major depressive disorder, single episode, unspecified: Secondary | ICD-10-CM

## 2014-05-24 DIAGNOSIS — F3289 Other specified depressive episodes: Secondary | ICD-10-CM

## 2014-05-24 MED ORDER — BUPROPION HCL 100 MG PO TABS: 100.0000 mg | ORAL_TABLET | Freq: Every morning | ORAL | Status: DC

## 2014-05-24 NOTE — Telephone Encounter (Signed)
Sent. Thanks.   

## 2014-05-24 NOTE — Telephone Encounter (Signed)
Last filled 02/22/14 with 2 refills-- last OV was 04/26/14--please advise if okay to refill

## 2014-06-07 ENCOUNTER — Telehealth: Payer: Self-pay

## 2014-06-07 ENCOUNTER — Encounter: Payer: Self-pay | Admitting: *Deleted

## 2014-06-07 NOTE — Telephone Encounter (Signed)
[name] at [facility] left v/m: pt has more than one order for acetaminophen; pt has order for acetaminophen 500mg  scheduled 2 tabs three times a day and also order for acetaminophen 500 mg two tabs three times a day as needed. Concerned about possible Tylenol toxicity. [name] thinks Tylenol toxicity has been changed to no more than 3.25 gms but will leave dosage and instructions to Dr Para Marchuncan.Jim request fax [phone] or call [phone] to cancel one of the orders for acetaminophen.Please advise.

## 2014-06-07 NOTE — Telephone Encounter (Signed)
[name] at [facility].  [name] asked that I fax this order to him for the group home.  Order faxed.

## 2014-06-07 NOTE — Telephone Encounter (Signed)
I would have the order for acetaminophen 500mg  scheduled 2 tabs three times a day. Please stop the prns.  Thanks.

## 2014-06-14 ENCOUNTER — Other Ambulatory Visit: Payer: Self-pay | Admitting: *Deleted

## 2014-06-14 MED ORDER — DOCUSATE SODIUM 50 MG/5ML PO LIQD: 100.0000 mg | Freq: Every day | ORAL | Status: DC

## 2014-06-21 ENCOUNTER — Other Ambulatory Visit: Payer: Self-pay | Admitting: *Deleted

## 2014-06-21 MED ORDER — DIGOXIN 125 MCG PO TABS: 125.0000 ug | ORAL_TABLET | Freq: Every morning | ORAL | Status: DC

## 2014-06-21 NOTE — Telephone Encounter (Signed)
Received a faxed refill request from pharmacy. Refill sent to pharmacy electronically. 

## 2014-08-04 ENCOUNTER — Other Ambulatory Visit: Payer: Self-pay | Admitting: Family Medicine

## 2014-08-04 MED ORDER — SERTRALINE HCL 25 MG PO TABS: 25.0000 mg | ORAL_TABLET | Freq: Every morning | ORAL | Status: DC

## 2014-08-04 NOTE — Telephone Encounter (Signed)
Last filled 11/03/2013

## 2014-08-04 NOTE — Telephone Encounter (Signed)
Sent. Thanks.   

## 2014-08-09 ENCOUNTER — Other Ambulatory Visit: Payer: Self-pay | Admitting: *Deleted

## 2014-08-09 MED ORDER — ACETAMINOPHEN 500 MG PO TABS: 1000.0000 mg | ORAL_TABLET | Freq: Three times a day (TID) | ORAL | Status: DC

## 2014-08-30 ENCOUNTER — Other Ambulatory Visit: Payer: Self-pay | Admitting: *Deleted

## 2014-08-30 MED ORDER — CHOLECALCIFEROL 10 MCG (400 UNIT) PO TABS: 400.0000 [IU] | ORAL_TABLET | Freq: Two times a day (BID) | ORAL | Status: DC

## 2014-09-23 ENCOUNTER — Other Ambulatory Visit: Payer: Self-pay | Admitting: *Deleted

## 2014-09-23 MED ORDER — ONE-DAILY MULTI VITAMINS PO TABS: 1.0000 | ORAL_TABLET | Freq: Every morning | ORAL | Status: DC

## 2014-09-29 ENCOUNTER — Other Ambulatory Visit: Payer: Self-pay | Admitting: *Deleted

## 2014-09-29 MED ORDER — CALCIUM CARBONATE-VITAMIN D 500-200 MG-UNIT PO TABS: 1.0000 | ORAL_TABLET | Freq: Every evening | ORAL | Status: DC

## 2014-09-29 MED ORDER — SENNA 8.6 MG PO TABS: 1.0000 | ORAL_TABLET | Freq: Every evening | ORAL | Status: DC

## 2014-09-29 MED ORDER — METOPROLOL TARTRATE 25 MG PO TABS: ORAL_TABLET | ORAL | Status: DC

## 2014-09-29 NOTE — Addendum Note (Signed)
Addended by: Liane ComberHAVERS, Camrynn Mcclintic C on: 09/29/2014 01:10 PM   Modules accepted: Orders

## 2014-10-05 ENCOUNTER — Other Ambulatory Visit: Payer: Self-pay | Admitting: *Deleted

## 2014-10-05 MED ORDER — ACETAMINOPHEN 500 MG PO TABS: 1000.0000 mg | ORAL_TABLET | Freq: Three times a day (TID) | ORAL | Status: DC

## 2014-10-05 MED ORDER — FUROSEMIDE 20 MG PO TABS: 20.0000 mg | ORAL_TABLET | Freq: Every day | ORAL | Status: DC

## 2014-10-05 NOTE — Addendum Note (Signed)
Addended by: Damita LackLORING, DONNA S on: 10/05/2014 11:42 AM   Modules accepted: Orders

## 2014-11-05 ENCOUNTER — Other Ambulatory Visit: Payer: Self-pay | Admitting: *Deleted

## 2014-11-05 MED ORDER — LEVOTHYROXINE SODIUM 50 MCG PO TABS: 50.0000 ug | ORAL_TABLET | Freq: Every day | ORAL | Status: DC

## 2014-11-18 ENCOUNTER — Other Ambulatory Visit: Payer: Self-pay | Admitting: *Deleted

## 2014-11-18 DIAGNOSIS — F32A Depression, unspecified: Secondary | ICD-10-CM

## 2014-11-18 DIAGNOSIS — F329 Major depressive disorder, single episode, unspecified: Secondary | ICD-10-CM

## 2014-11-18 MED ORDER — BUPROPION HCL 100 MG PO TABS: 100.0000 mg | ORAL_TABLET | Freq: Every morning | ORAL | Status: DC

## 2014-12-01 ENCOUNTER — Other Ambulatory Visit: Payer: Self-pay | Admitting: *Deleted

## 2014-12-01 NOTE — Telephone Encounter (Signed)
Received refill request electronically from pharmacy. Last refill 10/05/14 #180/ 1 refill, last office visit 04/26/14. Is it okay to refill medication?

## 2014-12-02 MED ORDER — ACETAMINOPHEN 500 MG PO TABS: 1000.0000 mg | ORAL_TABLET | Freq: Three times a day (TID) | ORAL | Status: DC

## 2014-12-02 NOTE — Telephone Encounter (Signed)
Sent. Thanks.   

## 2015-01-17 ENCOUNTER — Other Ambulatory Visit: Payer: Self-pay | Admitting: *Deleted

## 2015-01-17 MED ORDER — DIGOXIN 125 MCG PO TABS: 125.0000 ug | ORAL_TABLET | Freq: Every morning | ORAL | Status: DC

## 2015-01-17 NOTE — Telephone Encounter (Signed)
Faxed refill request   

## 2015-01-21 ENCOUNTER — Other Ambulatory Visit: Payer: Self-pay | Admitting: *Deleted

## 2015-01-21 MED ORDER — DOCUSATE SODIUM 50 MG/5ML PO LIQD: 100.0000 mg | Freq: Every day | ORAL | Status: DC

## 2015-01-25 ENCOUNTER — Other Ambulatory Visit: Payer: Self-pay | Admitting: *Deleted

## 2015-01-25 NOTE — Telephone Encounter (Signed)
Last office visit 04/26/2014.  Last refilled 08/04/2014 for #30 with 5 refills.  Ok to refill?

## 2015-01-26 MED ORDER — SERTRALINE HCL 25 MG PO TABS: 25.0000 mg | ORAL_TABLET | Freq: Every morning | ORAL | Status: DC

## 2015-01-26 NOTE — Telephone Encounter (Signed)
Sent. Thanks.   

## 2015-03-03 ENCOUNTER — Other Ambulatory Visit: Payer: Self-pay | Admitting: Family Medicine

## 2015-03-03 DIAGNOSIS — F329 Major depressive disorder, single episode, unspecified: Secondary | ICD-10-CM

## 2015-03-03 DIAGNOSIS — F32A Depression, unspecified: Secondary | ICD-10-CM

## 2015-03-03 MED ORDER — ACETAMINOPHEN 500 MG PO TABS: 1000.0000 mg | ORAL_TABLET | Freq: Three times a day (TID) | ORAL | Status: DC

## 2015-03-03 MED ORDER — BUPROPION HCL 100 MG PO TABS: 100.0000 mg | ORAL_TABLET | Freq: Every morning | ORAL | Status: DC

## 2015-03-10 ENCOUNTER — Other Ambulatory Visit: Payer: Self-pay | Admitting: *Deleted

## 2015-03-10 MED ORDER — CHOLECALCIFEROL 10 MCG (400 UNIT) PO TABS: 400.0000 [IU] | ORAL_TABLET | Freq: Two times a day (BID) | ORAL | Status: DC

## 2015-03-19 NOTE — H&P (Signed)
PATIENT NAME:  [name], ABRAM MR#:  [id number] DATE OF BIRTH:  04-15-1922  DATE OF ADMISSION:  11/30/2013  PRIMARY CARE PHYSICIAN: [name], MD  CHIEF COMPLAINT: Confusion, lethargy.   HISTORY OF PRESENTING ILLNESS: This is an 81 year old Caucasian female patient a resident of an assisted living facility with history of recurrent UTIs, atrial fibrillation, dementia, and hypothyroidism who presents to the Emergency Room with confusion and weakness. The son who is at bedside mentions that he initially thought the patient had a UTI, but here in the Emergency Room the patient does not have a UTI. She has had weakness, decreased appetite over the past few days, has had occasional cough. Today has worsened and was sent to the Emergency Room. Here the patient's chest x-ray shows possible right-sided pneumonia, although she is not on any oxygen, no fever, normal white count, but with significant encephalopathy is being admitted to the hospitalist service.   Son mentions that she does tend to have similar presentations with UTIs and in the past 1 year back has had similar presentation with dehydration.   No recent change in medications.   History has been obtained from reviewing old records, ER physician, nursing staff and son at bedside. The patient is not able to contribute to history secondary to her encephalopathy.   PAST MEDICAL HISTORY: 1.  Alzheimer dementia.  2.  Recurrent UTIs.  3.  Chronic atrial fibrillation.  4.  Depression.  5.  Constipation.  6.  Hypothyroidism.   SOCIAL HISTORY: The patient does not smoke. No alcohol. No illicit drugs. Is a resident of an assisted living facility. Is bedbound and moves around in a wheelchair and needs significant help with activities of daily living.   FAMILY HISTORY: Reviewed but unknown as the patient is unable to contribute to history.   ALLERGIES: ALENDRONATE, ARICEPT AND WARFARIN.   REVIEW OF SYSTEMS: Unobtainable secondary to the  patient's encephalopathy.   HOME MEDICATIONS: Include:  1.  Digoxin 125 mcg daily.  2.  Bupropion 100 mg oral once a day. 3.  Docusate 10 mL oral once a day. 4.  Ferrous sulfate 325 mg oral once a day.  5.  Lasix 20 mg oral once a day.  6.  Tylenol 500 mg oral every 6 hours as needed.  7.  Metoprolol tartrate 25 mg 1/2 tablet oral 2 times a day.  8.  Senna 1 tablet oral once a day.  9.  Sertraline 25 mg oral once a day.  10.  Synthroid 50 mcg oral once a day.  11.  Multivitamin 1 tablet daily.  12.  Vitamin D3 400 international units 2 times a day.   PHYSICAL EXAMINATION: VITAL SIGNS: Temperature 97.6, pulse 81, blood pressure 154/104, and saturating 99% on room air.  GENERAL: Moderately built, elderly Caucasian female patient lying in bed, drowsy.  PSYCHIATRIC: Wakes up on calling her name. Has a flat affect. One to 2 word answers.  HEENT: Atraumatic, normocephalic. Oral mucosa dry and pink. External ears and nose normal. No pallor. No icterus. Pupils bilaterally equal and reactive to light.  NECK: Supple. No thyromegaly or palpable lymph nodes. Trachea midline. No carotid bruit or JVD.  CARDIOVASCULAR: S1 and S2 irregular without any murmurs. Peripheral pulses 2+. No edema.  RESPIRATORY: Normal work of breathing. Clear to auscultation on both sides.  GASTROINTESTINAL: Soft abdomen, nontender. Bowel sounds present. Seems to have some tenderness in the suprapubic area.  GENITOURINARY: No CVA tenderness.  MUSCULOSKELETAL: No joint swelling, redness, or effusion  of the large joints. Normal muscle tone.  NEUROLOGICAL: Motor strength 4/5 in upper and lower extremities. Babinski's downgoing.   LABORATORY AND DIAGNOSTICS: Lab studies show glucose 92, BUN 20, creatinine 0.92, sodium 137, and potassium 3.8. AST, ALT, alkaline phosphatase and bilirubin normal. Troponin less than 0.02. WBC 8.1, hemoglobin 14.4, and platelets of 90. Influenza test negative. Urinalysis shows no bacteria, 1 WBC.    EKG shows atrial fibrillation.   CT scan of the head shows some atrophy, nothing acute.   Chest x-ray, portable single view, shows patchy airspace disease in the right midlung, possible pneumonia.   ASSESSMENT AND PLAN: 1.  Right-sided pneumonia with acute encephalopathy. The patient does not have any fever or elevated white count, but with encephalopathy will treat with IV antibiotics. We will get sputum cultures. Oxygen as needed and DuoNeb as needed. The patient also seems dehydrated and was started on IV fluids.  2.  Acute encephalopathy. Likely from the pneumonia but will check a digoxin, TSH and ammonia level along with B12 level. Presentation very similar to prior urinary tract infections as per her son, but the patient does not have a urinary tract infection and likely from the infection from pneumonia.  3.  Chronic atrial fibrillation, rate controlled. Continue the rate controlling medications.  4.  Hypothyroidism. Continue medications.  5.  Mild thrombocytopenia. Platelet count is 90. The patient does not have any bleeding. Baseline unknown. We will follow and repeat platelet count tomorrow.  6.  Deep vein thrombosis prophylaxis with Lovenox.   CODE STATUS: DNR/DNI as discussed with healthcare power of attorney, son at bedside. He mentions that he wanted his mother to be full code in the past, but she has had deterioration with multiple comorbidities and presently feels that for her quality of life that she should be a DNR/DNI after discussing with him.   TIME SPENT TODAY ON THIS CASE: 45 minutes. ____________________________ Molinda Bailiff Ikaika Showers, MD srs:sb D: 11/30/2013 16:05:46 ET T: 11/30/2013 16:56:00 ET JOB#: [id number]  cc: Wardell Heath R. Cacie Gaskins, MD, <Dictator> Dwana Curd. Para March, MD Orie Fisherman MD ELECTRONICALLY SIGNED 12/03/2013 11:13

## 2015-03-19 NOTE — Discharge Summary (Signed)
PATIENT NAME:  [name], [name] MR#:  [id number] DATE OF BIRTH:  04/16/1922  DATE OF ADMISSION:  11/30/2013  DATE OF DISCHARGE:  12/02/2013  For a detailed note, please see the History and Physical done on admission by Dr. Elpidio AnisSudini.  DIAGNOSES AT DISCHARGE:  1.  Altered mental status secondary to metabolic encephalopathy from pneumonia.  2.  Pneumonia.  3.  Hypertension.  4.  Hypothyroidism.  5.  GERD.    INSTRUCTIONS:  The patient is being discharged on a low-sodium, low-fat, mechanical soft diet. Activity is as tolerated. Followup with Dr. Crawford GivensGraham Duncan in the next 1 to 2 weeks.   DISCHARGE MEDICATIONS: Synthroid 50 mcg daily, bupropion 100 mg daily, digoxin 0.125 mg daily, Colace 100 mg daily, iron sulfate 325 mg daily, Lasix 20 mg daily, Zoloft 25 mg daily, metoprolol tartrate 25 mg 1/2 tab b.i.d., multivitamin daily, vitamin D3, 400 international units b.i.d., oyster shell with calcium and vitamin D 1 tab daily, Senokot 1 tab daily, Tylenol 500 mg q. 6 hours as needed, and Levaquin 750 mg every other day x 10 days.   PERTINENT STUDIES DONE DURING THE HOSPITAL COURSE ARE AS FOLLOWS: A CT scan of the head done without contrast on admission showing atrophy with white matter disease, no acute intracranial abnormality. A chest x-ray done on admission showing patchy air space disease in the right midlung, suggestive either of a small focus of pneumonia or asymmetric pulmonary edema. A repeat chest x-ray done on January 6 showing persistent right upper lobe airspace opacity compatible with pneumonia.   HOSPITAL COURSE: This is an 81 year old female with medical problems as mentioned above, presented to the hospital due to altered mental status.   1. Altered mental status. The most likely cause of this was metabolic encephalopathy from the underlying pneumonia. The patient was started on IV antibiotics for pneumonia. Her mental status was further followed. She had a CT of the head done on admission,  which was negative. She also had an ammonia and TSH level, which were normal. She had no other focal metabolic or infectious source of her mental status change. After treatment with IV antibiotics, her mental status is now back to baseline.   2.  Pneumonia. This is likely community-acquired pneumonia, the likely cause of her mental status change. She is currently afebrile and hemodynamically stable. She also does not have a white cell count. She has been treated adequately with IV Levaquin. She will finish treatment with IV Levaquin for the next 10 days, every other day, to finish treatment for her pneumonia.    3. Hypertension. The patient remained hemodynamically stable. She will continue her metoprolol.   4.  Hypothyroidism. The patient was maintained on her Synthroid. She will resume that.   5.  Gastroesophageal reflux disease. The patient was maintained on her Protonix. She will resume that.   6.  Thrombocytopenia. The patient had a low platelet count on admission, as low as 90,000. Has dropped down to as low as 78,000, although she has shown no evidence of acute bleeding, and she is not anemic. I did refer her to Hematology as an outpatient for evaluation of her thrombocytopenia.   The patient is a DNI/DNR.   Time spent on discharge is 40 minutes.    ____________________________ Rolly PancakeVivek J. Cherlynn KaiserSainani, MD vjs:mr D: 12/02/2013 17:04:00 ET T: 12/02/2013 20:45:07 ET JOB#: [id number]  cc: Rolly PancakeVivek J. Cherlynn KaiserSainani, MD, <Dictator> Dwana CurdGraham S. Para Marchuncan, MD  Houston SirenVIVEK J Mailyn Steichen MD ELECTRONICALLY SIGNED 12/23/2013 11:22

## 2015-03-25 ENCOUNTER — Other Ambulatory Visit: Payer: Self-pay | Admitting: *Deleted

## 2015-03-25 MED ORDER — CALCIUM CARBONATE-VITAMIN D 500-200 MG-UNIT PO TABS: ORAL_TABLET | ORAL | Status: DC

## 2015-03-25 MED ORDER — FUROSEMIDE 20 MG PO TABS: ORAL_TABLET | ORAL | Status: DC

## 2015-03-25 MED ORDER — METOPROLOL TARTRATE 25 MG PO TABS: ORAL_TABLET | ORAL | Status: DC

## 2015-04-12 ENCOUNTER — Emergency Department: Payer: Medicare Other

## 2015-04-12 ENCOUNTER — Inpatient Hospital Stay
Admission: EM | Admit: 2015-04-12 | Discharge: 2015-04-15 | DRG: 871 | Disposition: A | Discharge status: Nursing Home (Includes ICF) | Payer: Medicare Other | Attending: Internal Medicine | Admitting: Internal Medicine

## 2015-04-12 ENCOUNTER — Encounter: Payer: Self-pay | Admitting: Emergency Medicine

## 2015-04-12 DIAGNOSIS — F0390 Unspecified dementia without behavioral disturbance: Secondary | ICD-10-CM | POA: Diagnosis present

## 2015-04-12 DIAGNOSIS — Z66 Do not resuscitate: Secondary | ICD-10-CM | POA: Diagnosis present

## 2015-04-12 DIAGNOSIS — N3001 Acute cystitis with hematuria: Secondary | ICD-10-CM | POA: Diagnosis present

## 2015-04-12 DIAGNOSIS — G9341 Metabolic encephalopathy: Secondary | ICD-10-CM | POA: Diagnosis present

## 2015-04-12 DIAGNOSIS — N390 Urinary tract infection, site not specified: Secondary | ICD-10-CM | POA: Diagnosis present

## 2015-04-12 DIAGNOSIS — A4151 Sepsis due to Escherichia coli [E. coli]: Secondary | ICD-10-CM | POA: Diagnosis not present

## 2015-04-12 DIAGNOSIS — M810 Age-related osteoporosis without current pathological fracture: Secondary | ICD-10-CM | POA: Diagnosis present

## 2015-04-12 DIAGNOSIS — I482 Chronic atrial fibrillation: Secondary | ICD-10-CM | POA: Diagnosis present

## 2015-04-12 DIAGNOSIS — I252 Old myocardial infarction: Secondary | ICD-10-CM

## 2015-04-12 DIAGNOSIS — I4891 Unspecified atrial fibrillation: Secondary | ICD-10-CM | POA: Diagnosis present

## 2015-04-12 DIAGNOSIS — F0280 Dementia in other diseases classified elsewhere without behavioral disturbance: Secondary | ICD-10-CM | POA: Diagnosis present

## 2015-04-12 DIAGNOSIS — F329 Major depressive disorder, single episode, unspecified: Secondary | ICD-10-CM | POA: Diagnosis present

## 2015-04-12 DIAGNOSIS — G9340 Encephalopathy, unspecified: Secondary | ICD-10-CM | POA: Diagnosis present

## 2015-04-12 DIAGNOSIS — Z801 Family history of malignant neoplasm of trachea, bronchus and lung: Secondary | ICD-10-CM

## 2015-04-12 DIAGNOSIS — I10 Essential (primary) hypertension: Secondary | ICD-10-CM | POA: Diagnosis present

## 2015-04-12 DIAGNOSIS — B9620 Unspecified Escherichia coli [E. coli] as the cause of diseases classified elsewhere: Secondary | ICD-10-CM | POA: Diagnosis present

## 2015-04-12 DIAGNOSIS — E039 Hypothyroidism, unspecified: Secondary | ICD-10-CM | POA: Diagnosis present

## 2015-04-12 DIAGNOSIS — M1990 Unspecified osteoarthritis, unspecified site: Secondary | ICD-10-CM | POA: Diagnosis present

## 2015-04-12 DIAGNOSIS — A419 Sepsis, unspecified organism: Secondary | ICD-10-CM | POA: Diagnosis present

## 2015-04-12 DIAGNOSIS — Z96641 Presence of right artificial hip joint: Secondary | ICD-10-CM | POA: Diagnosis present

## 2015-04-12 DIAGNOSIS — F05 Delirium due to known physiological condition: Secondary | ICD-10-CM | POA: Diagnosis present

## 2015-04-12 LAB — URINALYSIS COMPLETE WITH MICROSCOPIC (ARMC ONLY)
BILIRUBIN URINE: NEGATIVE
GLUCOSE, UA: NEGATIVE mg/dL
Ketones, ur: NEGATIVE mg/dL
NITRITE: POSITIVE — AB
Protein, ur: 30 mg/dL — AB
SPECIFIC GRAVITY, URINE: 1.016 (ref 1.005–1.030)
pH: 6 (ref 5.0–8.0)

## 2015-04-12 LAB — CBC WITH DIFFERENTIAL/PLATELET
Basophils Absolute: 0 10*3/uL (ref 0–0.1)
EOS ABS: 0 10*3/uL (ref 0–0.7)
Eosinophils Relative: 0 %
HCT: 47.4 % — ABNORMAL HIGH (ref 35.0–47.0)
Hemoglobin: 15.5 g/dL (ref 12.0–16.0)
LYMPHS ABS: 1.4 10*3/uL (ref 1.0–3.6)
Lymphocytes Relative: 26 %
MCH: 30.7 pg (ref 26.0–34.0)
MCHC: 32.7 g/dL (ref 32.0–36.0)
MCV: 93.9 fL (ref 80.0–100.0)
Monocytes Absolute: 0.6 10*3/uL (ref 0.2–0.9)
Monocytes Relative: 11 %
NEUTROS ABS: 3.4 10*3/uL (ref 1.4–6.5)
PLATELETS: 94 10*3/uL — AB (ref 150–440)
RBC: 5.05 MIL/uL (ref 3.80–5.20)
RDW: 14 % (ref 11.5–14.5)
WBC: 5.4 10*3/uL (ref 3.6–11.0)

## 2015-04-12 LAB — BASIC METABOLIC PANEL
Anion gap: 16 — ABNORMAL HIGH (ref 5–15)
BUN: 32 mg/dL — AB (ref 6–20)
CO2: 26 mmol/L (ref 22–32)
CREATININE: 1.05 mg/dL — AB (ref 0.44–1.00)
Calcium: 9.8 mg/dL (ref 8.9–10.3)
Chloride: 106 mmol/L (ref 101–111)
GFR calc Af Amer: 53 mL/min — ABNORMAL LOW (ref >60)
GFR calc non Af Amer: 45 mL/min — ABNORMAL LOW (ref >60)
GLUCOSE: 86 mg/dL (ref 65–99)
Potassium: 4.3 mmol/L (ref 3.5–5.1)
Sodium: 148 mmol/L — ABNORMAL HIGH (ref 135–145)

## 2015-04-12 MED ORDER — SODIUM CHLORIDE 0.9 % IV BOLUS (SEPSIS)
1000.0000 mL | Freq: Once | INTRAVENOUS | Status: AC
  Administered 2015-04-12: 1000 mL via INTRAVENOUS

## 2015-04-12 MED ORDER — DEXTROSE 5 % IV SOLN
1.0000 g | Freq: Once | INTRAVENOUS | Status: AC
  Administered 2015-04-12: 1 g via INTRAVENOUS

## 2015-04-12 MED ORDER — DEXTROSE 5 % IV SOLN
INTRAVENOUS | Status: AC
  Administered 2015-04-12: 1 g via INTRAVENOUS
  Filled 2015-04-12: qty 10

## 2015-04-12 NOTE — ED Provider Notes (Signed)
[facility] Emergency Department Provider Note   ____________________________________________  Time seen: 6 PM I have reviewed the triage vital signs and the triage nursing note.  HISTORY  Chief Complaint Altered Mental Status   Historian Limited by patient's dementia EMS provides history Son provides history  HPI [name] is a 81 y.o. female who is brought in from her [facility] for complaint of 2 elevated blood pressures today above 200 systolically. Also some history that over the past day and a half patient has had some decreased level of consciousness/altered mental status. There's been no report of chest pain, shortness breath, fever, vomiting, focal weakness or numbness. Symptoms are moderate   Past Medical History  Diagnosis Date  . Anxiety   . HTN (hypertension)   . Atrial fibrillation   . Dementia   . Tremor   . Osteoporosis     prev intolerant of alendronate  . Myocardial infarction   . Anemia   . Hypertension   . Closed left hip fracture   . Arthritis   . DEMENTIA   . Pneumonia   . Hypothyroid   . Hypothyroidism   . Depression     Patient Active Problem List   Diagnosis Date Noted  . Knee pain 04/26/2014  . Skin lesion 01/31/2014  . Thrombocytopenia, unspecified 12/31/2013  . DNR (do not resuscitate) 06/18/2013  . Advanced directives, counseling/discussion 12/18/2012  . Orthostatic hypotension 08/25/2012  . Thrombocytopenia 05/20/2012  . Hip fracture, intertrochanteric 05/17/2012  . Osteoporosis 05/17/2012  . Hypothyroidism 05/17/2012  . Rhabdomyolysis 01/01/2012  . Syncope and collapse 12/31/2011  . UTI (lower urinary tract infection) 12/31/2011  . Atrial fibrillation 12/31/2011  . HTN (hypertension) 12/31/2011  . Dementia 08/26/2011  . Tremor 08/26/2011  . Anemia 08/26/2011  . History of pneumonia 08/26/2011  . Spinal stenosis 08/26/2011  . Hip fracture, left 08/26/2011  .  Long term (current) use of anticoagulants 07/17/2011  . Atrial fibrillation 07/13/2011    Past Surgical History  Procedure Laterality Date  . Head surgery      for trigeminal neuralgia  . Total hip arthroplasty      right  . Cataract extraction    . Appendectomy    . Cataract extraction, bilateral    . Rotator cuff repair      left  . Closed reduction humerus fracture      left    Current Outpatient Rx  Name  Route  Sig  Dispense  Refill  . acetaminophen (TYLENOL) 500 MG tablet   Oral   Take 2 tablets (1,000 mg total) by mouth 3 (three) times daily.   180 tablet   1   . buPROPion (WELLBUTRIN) 100 MG tablet   Oral   Take 1 tablet (100 mg total) by mouth every morning.   30 tablet   0   . calcium-vitamin D (OYSTER CALCIUM 500 + D) 500-200 MG-UNIT per tablet      Take one every evening. **MUST HAVE PHYSICAL FOR FURTHER REFILLS.**   30 tablet   1   . cholecalciferol (VITAMIN D) 400 UNITS TABS tablet   Oral   Take 1 tablet (400 Units total) by mouth 2 (two) times daily.   60 each   5   . digoxin (LANOXIN) 0.125 MG tablet   Oral   Take 1 tablet (125 mcg total) by mouth every morning.   30 tablet   5   . docusate (COLACE) 50 MG/5ML  liquid   Oral   Take 10 mLs (100 mg total) by mouth daily.   100 mL   5   . ferrous sulfate 325 (65 FE) MG tablet   Oral   Take 1 tablet (325 mg total) by mouth daily with breakfast.   90 tablet   3   . furosemide (LASIX) 20 MG tablet      Take one every day. **MUST HAVE PHYSICAL FOR FURTHER REFILLS.**   30 tablet   1   . levothyroxine (SYNTHROID, LEVOTHROID) 50 MCG tablet   Oral   Take 1 tablet (50 mcg total) by mouth daily. 6 days a week, not on Sundays   90 tablet   1   . metoprolol tartrate (LOPRESSOR) 25 MG tablet      Take 1/2 tab let by mouth twice a day. **MUST HAVE PHYSICAL FOR FURTHER REFILLS.**   30 tablet   1   . Multiple Vitamin (MULTIVITAMIN) tablet   Oral   Take 1 tablet by mouth every morning.    30 tablet   11   . NUTRITIONAL SUPPLEMENT LIQD      Drink one 8 ounce can by mouth between breakfast and lunch and again between lunch and supper         . senna (SENOKOT) 8.6 MG TABS tablet   Oral   Take 1 tablet (8.6 mg total) by mouth every evening.   120 each   1   . sertraline (ZOLOFT) 25 MG tablet   Oral   Take 1 tablet (25 mg total) by mouth every morning.   30 tablet   5     Allergies Alendronate sodium; Aricept; and Warfarin sodium  Family History  Problem Relation Age of Onset  . Cancer    . Cancer Sister     breast cancer  . Cancer Brother     lung cancer    Social History History  Substance Use Topics  . Smoking status: Never Smoker   . Smokeless tobacco: Never Used  . Alcohol Use: No    Review of Systems Limited by patient's dementia Constitutional: Negative for fever. Eyes: Negative for visual changes. ENT: Negative for sore throat. Cardiovascular: Negative for chest pain. Respiratory: Negative for shortness of breath. Gastrointestinal: Negative for abdominal pain, vomiting and diarrhea. Genitourinary: No history of recurrent UTIs Musculoskeletal:  Skin: Negative for rash. Neurological: Negative for headaches, focal weakness or numbness.  ____________________________________________   PHYSICAL EXAM:  VITAL SIGNS: ED Triage Vitals  Enc Vitals Group     BP 04/12/15 1815 189/150 mmHg     Pulse Rate 04/12/15 1815 86     Resp 04/12/15 1815 20     Temp 04/12/15 1815 97.6 F (36.4 C)     Temp Source 04/12/15 1815 Oral     SpO2 04/12/15 1815 98 %     Weight 04/12/15 1815 158 lb (71.668 kg)     Height 04/12/15 1815 5\' 4"  (1.626 m)     Head Cir --      Peak Flow --      Pain Score 04/12/15 1812 0     Pain Loc --      Pain Edu? --      Excl. in GC? --      Constitutional: Alert and oriented. Well appearing and in no distress. Eyes: Conjunctivae are normal. PERRL. Normal extraocular movements. ENT   Head: Normocephalic and  atraumatic.   Nose: No congestion/rhinnorhea.   Mouth/Throat: Mucous membranes are  moist. Some remnants of food in her mouth.   Neck: No stridor. Cardiovascular: Normal rate, regular rhythm.  No murmurs, rubs, or gallops. Respiratory: Normal respiratory effort without tachypnea nor retractions. Breath sounds are clear and equal bilaterally. No wheezes/rales/rhonchi. Gastrointestinal: Soft and nontender. No distention.  Genitourinary: Musculoskeletal: Nontender with normal range of motion in all extremities. No joint effusions.  No lower extremity tenderness nor edema. Neurologic: Dementia, but cooperative. Moving 4 extremities with no focal weakness or numbness appreciated Skin:  Skin is warm, dry and intact. No rash noted. Psychiatric:   ____________________________________________   EKG  Atrial fibrillation. 79 bpm. Normal QRS. Normal axis. Nonspecific ST and T-wave. ____________________________________________  LABS (pertinent positives/negatives)  Urinalysis: Too numerous to count white blood cells and many bacteria Metabolic panel : BUN 32 and crying 1.05 CBC: White blood cell count 5.4, platelet 94   ____________________________________________  RADIOLOGY Radiologist results reviewed  Chest: Negative __________________________________________  PROCEDURES  Procedure(s) performed: No Critical Care performed: No  ____________________________________________   ED COURSE / ASSESSMENT AND PLAN  Pertinent labs & imaging results that were available during my care of the patient were reviewed by me and considered in my medical decision making (see chart for details).   Family is concerned about a UTI given that the patient has looked this in the past when she had a urinary tract infection. Her urinalysis is consistent with urinary tract infection. Although she was not febrile and had stable vital signs initially, during her stay she became tachycardic from the  110s into the 140s. I sent blood cultures and a urine culture and treated patient with IV Rocephin. I discussed with the family having her admitted to the hospital overnight for treatment and observation for possible sepsis. Patient was having some desats while snoring which I suspect is due to sleep apnea and 2 L were placed.  Metabolic panel shows acute renal failure likely due to some old dehydration. IV fluids were started. ___________________________________________   FINAL CLINICAL IMPRESSION(S) / ED DIAGNOSES  Acute urinary tract infection Acute renal failure unspecified    Governor Rooks, MD 04/12/15 2328

## 2015-04-12 NOTE — ED Notes (Signed)
Urine obtained via straight cath and sent to lab.

## 2015-04-12 NOTE — ED Notes (Signed)
Son states patient is at baseline level here today. States she has declined over the last two weeks. States she was able to feed herself then but has since been unable to. Patient is wheelchair bound and incontinent of urine and bowels.

## 2015-04-12 NOTE — ED Notes (Signed)
Brought in via [facility] from [facility] [place].. Per ems the staff states she is more confused today ..Marland Kitchen

## 2015-04-12 NOTE — ED Notes (Signed)
Lab to bedside to redraw labs and draw blood cultures at this time.

## 2015-04-13 DIAGNOSIS — F329 Major depressive disorder, single episode, unspecified: Secondary | ICD-10-CM | POA: Diagnosis present

## 2015-04-13 DIAGNOSIS — Z66 Do not resuscitate: Secondary | ICD-10-CM | POA: Diagnosis present

## 2015-04-13 DIAGNOSIS — M810 Age-related osteoporosis without current pathological fracture: Secondary | ICD-10-CM | POA: Diagnosis present

## 2015-04-13 DIAGNOSIS — N3001 Acute cystitis with hematuria: Secondary | ICD-10-CM | POA: Diagnosis present

## 2015-04-13 DIAGNOSIS — Z801 Family history of malignant neoplasm of trachea, bronchus and lung: Secondary | ICD-10-CM | POA: Diagnosis not present

## 2015-04-13 DIAGNOSIS — I10 Essential (primary) hypertension: Secondary | ICD-10-CM | POA: Diagnosis present

## 2015-04-13 DIAGNOSIS — G9341 Metabolic encephalopathy: Secondary | ICD-10-CM | POA: Diagnosis present

## 2015-04-13 DIAGNOSIS — F05 Delirium due to known physiological condition: Secondary | ICD-10-CM | POA: Diagnosis present

## 2015-04-13 DIAGNOSIS — B9620 Unspecified Escherichia coli [E. coli] as the cause of diseases classified elsewhere: Secondary | ICD-10-CM | POA: Diagnosis present

## 2015-04-13 DIAGNOSIS — A4151 Sepsis due to Escherichia coli [E. coli]: Secondary | ICD-10-CM | POA: Diagnosis present

## 2015-04-13 DIAGNOSIS — Z96641 Presence of right artificial hip joint: Secondary | ICD-10-CM | POA: Diagnosis present

## 2015-04-13 DIAGNOSIS — F0280 Dementia in other diseases classified elsewhere without behavioral disturbance: Secondary | ICD-10-CM | POA: Diagnosis present

## 2015-04-13 DIAGNOSIS — M1990 Unspecified osteoarthritis, unspecified site: Secondary | ICD-10-CM | POA: Diagnosis present

## 2015-04-13 DIAGNOSIS — I482 Chronic atrial fibrillation: Secondary | ICD-10-CM | POA: Diagnosis present

## 2015-04-13 DIAGNOSIS — E039 Hypothyroidism, unspecified: Secondary | ICD-10-CM | POA: Diagnosis present

## 2015-04-13 DIAGNOSIS — N390 Urinary tract infection, site not specified: Secondary | ICD-10-CM | POA: Diagnosis present

## 2015-04-13 DIAGNOSIS — I252 Old myocardial infarction: Secondary | ICD-10-CM | POA: Diagnosis not present

## 2015-04-13 LAB — CBC
HCT: 43.2 % (ref 35.0–47.0)
HEMOGLOBIN: 14.3 g/dL (ref 12.0–16.0)
MCH: 31.4 pg (ref 26.0–34.0)
MCHC: 33.2 g/dL (ref 32.0–36.0)
MCV: 94.6 fL (ref 80.0–100.0)
Platelets: 86 10*3/uL — ABNORMAL LOW (ref 150–440)
RBC: 4.57 MIL/uL (ref 3.80–5.20)
RDW: 14 % (ref 11.5–14.5)
WBC: 5.9 10*3/uL (ref 3.6–11.0)

## 2015-04-13 LAB — BASIC METABOLIC PANEL
ANION GAP: 7 (ref 5–15)
BUN: 26 mg/dL — AB (ref 6–20)
CO2: 28 mmol/L (ref 22–32)
Calcium: 8.7 mg/dL — ABNORMAL LOW (ref 8.9–10.3)
Chloride: 109 mmol/L (ref 101–111)
Creatinine, Ser: 0.95 mg/dL (ref 0.44–1.00)
GFR calc Af Amer: 59 mL/min — ABNORMAL LOW (ref >60)
GFR, EST NON AFRICAN AMERICAN: 51 mL/min — AB (ref >60)
Glucose, Bld: 91 mg/dL (ref 65–99)
Potassium: 3.7 mmol/L (ref 3.5–5.1)
SODIUM: 144 mmol/L (ref 135–145)

## 2015-04-13 LAB — CREATININE, SERUM
CREATININE: 0.93 mg/dL (ref 0.44–1.00)
GFR calc Af Amer: >60 mL/min (ref >60)
GFR calc non Af Amer: 53 mL/min — ABNORMAL LOW (ref >60)

## 2015-04-13 LAB — LACTIC ACID, PLASMA: Lactic Acid, Venous: 1.7 mmol/L (ref 0.5–2.0)

## 2015-04-13 MED ORDER — LABETALOL HCL 5 MG/ML IV SOLN
10.0000 mg | INTRAVENOUS | Status: DC | PRN
  Administered 2015-04-14: 10 mg via INTRAVENOUS
  Filled 2015-04-13: qty 4

## 2015-04-13 MED ORDER — LEVOTHYROXINE SODIUM 25 MCG PO TABS
50.0000 ug | ORAL_TABLET | Freq: Every day | ORAL | Status: DC
  Administered 2015-04-13 – 2015-04-15 (×3): 50 ug via ORAL
  Filled 2015-04-13: qty 1
  Filled 2015-04-13: qty 2
  Filled 2015-04-13 (×2): qty 1

## 2015-04-13 MED ORDER — SODIUM CHLORIDE 0.9 % IV BOLUS (SEPSIS)
1000.0000 mL | Freq: Once | INTRAVENOUS | Status: AC
  Administered 2015-04-13: 1000 mL via INTRAVENOUS

## 2015-04-13 MED ORDER — HEPARIN SODIUM (PORCINE) 5000 UNIT/ML IJ SOLN
5000.0000 [IU] | Freq: Three times a day (TID) | INTRAMUSCULAR | Status: DC
  Administered 2015-04-13 – 2015-04-14 (×5): 5000 [IU] via SUBCUTANEOUS
  Filled 2015-04-13 (×5): qty 1

## 2015-04-13 MED ORDER — SODIUM CHLORIDE 0.9 % IJ SOLN
3.0000 mL | Freq: Two times a day (BID) | INTRAMUSCULAR | Status: DC
  Administered 2015-04-13 – 2015-04-14 (×3): 3 mL via INTRAVENOUS

## 2015-04-13 MED ORDER — ACETAMINOPHEN 325 MG PO TABS: 650.0000 mg | ORAL_TABLET | Freq: Four times a day (QID) | ORAL | Status: DC | PRN

## 2015-04-13 MED ORDER — SERTRALINE HCL 25 MG PO TABS
25.0000 mg | ORAL_TABLET | Freq: Every morning | ORAL | Status: DC
Start: 2015-04-13 — End: 2015-04-15
  Administered 2015-04-13 – 2015-04-15 (×3): 25 mg via ORAL
  Filled 2015-04-13 (×3): qty 1

## 2015-04-13 MED ORDER — CEFTRIAXONE SODIUM IN DEXTROSE 20 MG/ML IV SOLN
1.0000 g | INTRAVENOUS | Status: DC
  Administered 2015-04-13 – 2015-04-14 (×2): 1 g via INTRAVENOUS
  Filled 2015-04-13 (×4): qty 50

## 2015-04-13 MED ORDER — ACETAMINOPHEN 650 MG RE SUPP: 650.0000 mg | Freq: Four times a day (QID) | RECTAL | Status: DC | PRN

## 2015-04-13 MED ORDER — METOPROLOL TARTRATE 25 MG PO TABS
12.5000 mg | ORAL_TABLET | Freq: Two times a day (BID) | ORAL | Status: DC
  Administered 2015-04-13 – 2015-04-15 (×5): 12.5 mg via ORAL
  Filled 2015-04-13 (×6): qty 1

## 2015-04-13 MED ORDER — SENNOSIDES-DOCUSATE SODIUM 8.6-50 MG PO TABS: 1.0000 | ORAL_TABLET | Freq: Every evening | ORAL | Status: DC | PRN

## 2015-04-13 MED ORDER — BUPROPION HCL 100 MG PO TABS
100.0000 mg | ORAL_TABLET | Freq: Every morning | ORAL | Status: DC
  Administered 2015-04-13 – 2015-04-15 (×3): 100 mg via ORAL
  Filled 2015-04-13 (×3): qty 1

## 2015-04-13 MED ORDER — SODIUM CHLORIDE 0.9 % IV SOLN
INTRAVENOUS | Status: DC
  Administered 2015-04-13 (×2): via INTRAVENOUS

## 2015-04-13 MED ORDER — DIGOXIN 125 MCG PO TABS
125.0000 ug | ORAL_TABLET | Freq: Every morning | ORAL | Status: DC
  Administered 2015-04-13 – 2015-04-15 (×3): 125 ug via ORAL
  Filled 2015-04-13 (×3): qty 1

## 2015-04-13 NOTE — Evaluation (Signed)
Clinical/Bedside Swallow Evaluation Patient Details  Name: [name] MRN: [id number] Date of Birth: [birth year]  Today's Date: 04/13/2015 Time: SLP Start Time (ACUTE ONLY): 1515 SLP Stop Time (ACUTE ONLY): 1605 SLP Time Calculation (min) (ACUTE ONLY): 50 min  Past Medical History:  Past Medical History  Diagnosis Date  . Anxiety   . HTN (hypertension)   . Atrial fibrillation   . Dementia   . Tremor   . Osteoporosis     prev intolerant of alendronate  . Myocardial infarction   . Anemia   . Hypertension   . Closed left hip fracture   . Arthritis   . DEMENTIA   . Pneumonia   . Hypothyroid   . Hypothyroidism   . Depression    Past Surgical History:  Past Surgical History  Procedure Laterality Date  . Head surgery      for trigeminal neuralgia  . Total hip arthroplasty      right  . Cataract extraction    . Appendectomy    . Cataract extraction, bilateral    . Rotator cuff repair      left  . Closed reduction humerus fracture      left   HPI:  Son reported pt has been increasingly weak/sick and sleeping more in the past week; he reported decreased oral intake during this past week. Pt has required feeding from staff at her NH this past week.    Assessment / Plan / Recommendation Clinical Impression  Pt presented w/ min-mod. oral phase deficits w/ oral intake; min. increased risk for pharyngeal phase deficits w/ thin liquids. Pt presents w/ increased drowsiness at this time as well as a baseline of Dementia which impairs her Cognitive awareness during the task of taking po's. She appeared to tolerate trials of thin liquids (by straw w/ monitoring of bolus size) and trials of purees following aspiration precautions and feeding assistance by SLP.      Aspiration Risk  Mild    Diet Recommendation Dysphagia 1 (Puree);Thin   Medication Administration: Crushed with puree Compensations: Slow rate;Small sips/bites    Other  Recommendations Recommended Consults: Other  (Comment) (Dietician consult) Oral Care Recommendations: Oral care BID   Follow Up Recommendations       Frequency and Duration min 3x week  1 week   Pertinent Vitals/Pain None reported    SLP Swallow Goals  see care plan   Swallow Study Prior Functional Status       General Date of Onset: 04/12/15 Other Pertinent Information: Son reported pt has been increasingly weak/sick and sleeping more in the past week; he reported decreased oral intake during this past week. Pt has required feeding from staff at her NH this past week.  Type of Study: Bedside swallow evaluation Previous Swallow Assessment: none reported Diet Prior to this Study: Regular;Thin liquids (pureed meats ) Temperature Spikes Noted: No Respiratory Status: Room air History of Recent Intubation: No Behavior/Cognition: Confused;Lethargic/Drowsy;Distractible;Requires cueing Oral Cavity - Dentition: Missing dentition (few) Self-Feeding Abilities: Total assist Patient Positioning: Upright in bed Baseline Vocal Quality: Low vocal intensity (mumbled speech) Volitional Cough: Cognitively unable to elicit Volitional Swallow: Unable to elicit    Oral/Motor/Sensory Function Overall Oral Motor/Sensory Function: Other (comment) (pt unable to follow through w/ OM ROM/strength tasks) Labial Strength: Reduced Lingual Symmetry: Within Functional Limits (grossly) Lingual Strength:  (unable to fully assess) Facial ROM: Within Functional Limits Facial Symmetry: Within Functional Limits Velum:  (unable to assess) Mandible:  (unable to assess)   Ice  Chips Ice chips: Within functional limits (x3) Presentation: Spoon Other Comments:  (slow oral movements for manipulation)   Thin Liquid Thin Liquid: Impaired Presentation: Straw (baseline use of straw only per Son sec. to pt's shakiness) Oral Phase Impairments:  (decreased awareness for bolus acceptance) Oral Phase Functional Implications:  (adequate) Pharyngeal  Phase  Impairments:  (audible, hard swallows x5/5 trials)    Nectar Thick Nectar Thick Liquid: Not tested   Honey Thick Honey Thick Liquid: Not tested   Puree Puree: Impaired Presentation: Spoon (pt fed) Oral Phase Impairments: Impaired anterior to posterior transit (min. increased oral phase time for bolus management) Oral Phase Functional Implications: Prolonged oral transit Pharyngeal Phase Impairments:  (adequate) Other Comments: pt demo. adequate oral clearing b/t trials   Solid   GO    Solid: Not tested Other Comments: pt too drowsy w/out sufficient stamina for mastication of increased textures at this time       [facility] Hospital[facility]son,Katherine 04/13/2015,4:12 PM

## 2015-04-13 NOTE — Clinical Social Work Note (Signed)
Clinical Social Work Assessment  Patient Details  Name: [name] MRN: [id number] Date of Birth: 1922-07-22  Date of referral:  04/13/15               Reason for consult:  Facility Placement ([facility] from [facility])                Permission sought to share information with:  Family Supports, Oceanographeracility Contact Representative Permission granted to share information::  No (CSW attempted to speak to pt but she was sleeping and would not wake when called.)  Name::        Agency::     Relationship::     Contact Information:     Housing/Transportation Living arrangements for the past 2 months:  [facility] Source of Information:  Medical Team, Adult Children, Facility Patient Interpreter Needed:  None Criminal Activity/Legal Involvement Pertinent to Current Situation/Hospitalization:  No - Comment as needed Significant Relationships:  Adult Children Lives with:  Facility Resident Do you feel safe going back to the place where you live?    Need for family participation in patient care:  Yes (Comment)  Care giving concerns:  Pt's son Chrissie NoaWilliam (listed in emergency contacts) was concerned that pt would need to go to SNF and not be able to return.     Social Worker assessment / plan:  CSW spoke to pt's son and explained that current plan is for pt to return to ALF.  CSW also spoke to ALF, per facility staff pt was already wheel chair bound and almost a total care pt.  They are able to accept pt back once she is medically stable to leave.    Employment status:  Retired, Disabled (Comment on whether or not currently receiving Disability) Insurance information:  Medicare PT Recommendations:    Information / Referral to community resources:     Patient/Family's Response to care:  Pt's son was in agreement with pt's return to ALF when medically stable.  Patient/Family's Understanding of and Emotional Response to Diagnosis, Current Treatment, and Prognosis:  Pt's son was in  agreement with DC back to ALF once medically stable.  Pt's son thanked CSW for speaking with him and stated that he would prefer the facility to transport the pt if possible.  CSW stated that we will use facility transport if pt does not need medical necissity  Emotional Assessment Appearance:  Appears stated age Attitude/Demeanor/Rapport:    Affect (typically observed):    Orientation:    Alcohol / Substance use:  Never Used Psych involvement (Current and /or in the community):  No (Comment)  Discharge Needs  Concerns to be addressed:  Home Safety Concerns Readmission within the last 30 days:  No Current discharge risk:    Barriers to Discharge:      Chauncy PassyBennerson, Puanani Gene J, LCSW 04/13/2015, 11:32 AM

## 2015-04-13 NOTE — Progress Notes (Signed)
[facility] PROGRESS NOTE  [name] WUJ:[id number] DOB: March 18, 1922 DOA: 04/12/2015 PCP: [name], MD  HPI/Subjective: Patient arouses from sleep. Was just looking at me for a few minutes. She tried to say no to a few questions.  Objective: Filed Vitals:   04/13/15 1227  BP: 132/96  Pulse: 100  Temp:   Resp:     Intake/Output Summary (Last 24 hours) at 04/13/15 1303 Last data filed at 04/13/15 0809  Gross per 24 hour  Intake 833.25 ml  Output      0 ml  Net 833.25 ml   Filed Weights   04/12/15 1815 04/13/15 0207  Weight: 71.668 kg (158 lb) 64.275 kg (141 lb 11.2 oz)    ROS: ROS difficult to obtain secondary to altered mental status. Exam: Physical Exam  Constitutional: She appears lethargic.  HENT:  Nose: No mucosal edema.  Mouth/Throat: No oropharyngeal exudate or posterior oropharyngeal edema.  Eyes: Conjunctivae, EOM and lids are normal. Pupils are equal, round, and reactive to light.  Neck: No JVD present. Carotid bruit is not present. No edema present. No thyroid mass and no thyromegaly present.  Cardiovascular: S1 normal and S2 normal.  Tachycardia present.  Exam reveals no gallop.   No murmur heard. Pulses:      Dorsalis pedis pulses are 2+ on the right side, and 2+ on the left side.  Respiratory: No respiratory distress. She has no wheezes. She has no rhonchi. She has no rales.  GI: Soft. Bowel sounds are normal. There is no tenderness.  Lymphadenopathy:    She has no cervical adenopathy.  Neurological: She appears lethargic.  Difficult to test secondary to altered mental status.  Skin: Skin is warm. No rash noted. Nails show no clubbing.  Psychiatric:  Patient awakened from sleep. Tried to answer some yes or no questions.    Data Reviewed: Basic Metabolic Panel:  Recent Labs Lab 04/12/15 2035 04/13/15 0405  NA 148* 144  K 4.3 3.7  CL 106 109  CO2 26 28  GLUCOSE 86 91  BUN 32* 26*  CREATININE 1.05* 0.95  0.93  CALCIUM  9.8 8.7*   CBC:  Recent Labs Lab 04/12/15 2215 04/13/15 0405  WBC 5.4 5.9  NEUTROABS 3.4  --   HGB 15.5 14.3  HCT 47.4* 43.2  MCV 93.9 94.6  PLT 94* 86*     Recent Results (from the past 240 hour(s))  Urine culture     Status: None (Preliminary result)   Collection Time: 04/12/15  7:19 PM  Result Value Ref Range Status   Specimen Description URINE, CATHETERIZED  Final   Special Requests NONE  Final   Culture NO GROWTH < 12 HOURS  Final   Report Status PENDING  Incomplete  Culture, blood (routine x 2)     Status: None (Preliminary result)   Collection Time: 04/12/15 10:15 PM  Result Value Ref Range Status   Specimen Description BLOOD  Final   Special Requests NONE  Final   Culture NO GROWTH < 12 HOURS  Final   Report Status PENDING  Incomplete  Culture, blood (routine x 2)     Status: None (Preliminary result)   Collection Time: 04/12/15 10:25 PM  Result Value Ref Range Status   Specimen Description BLOOD  Final   Special Requests NONE  Final   Culture NO GROWTH < 12 HOURS  Final   Report Status PENDING  Incomplete     Studies: Dg Chest Port 1 View  04/12/2015  CLINICAL DATA:  Altered mental status.  EXAM: PORTABLE CHEST - 1 VIEW  COMPARISON:  12/01/2013  FINDINGS: Chronic cardiomegaly with tortuosity and calcification of the thoracic aorta. The lungs are clear. Pulmonary vascularity is normal. No acute osseous abnormalities.  IMPRESSION: No acute abnormality.  Chronic cardiomegaly.   Electronically Signed   By: Francene Boyers M.D.   On: 04/12/2015 22:40    Scheduled Meds: . buPROPion  100 mg Oral q morning - 10a  . cefTRIAXone (ROCEPHIN)  IV  1 g Intravenous Q24H  . digoxin  125 mcg Oral q morning - 10a  . heparin  5,000 Units Subcutaneous 3 times per day  . levothyroxine  50 mcg Oral QAC breakfast  . sertraline  25 mg Oral q morning - 10a  . sodium chloride  3 mL Intravenous Q12H   Continuous Infusions: . sodium chloride 100 mL/hr at 04/13/15 0334     Assessment/Plan:  1. Clinical sepsis, acute cystitis with hematuria, tachycardia- patient is on IV Rocephin. 2. Accelerated hypertension- will start low dose metoprolol. 3. Acute delirium on top of dementia which was worsened with sepsis. We'll continue to monitor. Will get swallow evaluation. 4. Hypothyroidism unspecified continue levothyroxine. 5. Dementia without behavioral disturbance on Wellbutrin and Zoloft. 6. Chronic atrial fibrillation on digoxin and low-dose metoprolol.  Code Status:     Code Status Orders        Start     Ordered   04/13/15 0223  Do not attempt resuscitation (DNR)   Continuous    Question Answer Comment  In the event of cardiac or respiratory ARREST Do not call a "code blue"   In the event of cardiac or respiratory ARREST Do not perform Intubation, CPR, defibrillation or ACLS   In the event of cardiac or respiratory ARREST Use medication by any route, position, wound care, and other measures to relive pain and suffering. May use oxygen, suction and manual treatment of airway obstruction as needed for comfort.      04/13/15 0222     Family Communication: Spoke with son on the phone Disposition Plan: Back to assisted living versus nursing home placement  Time spent: 35 minutes  Alford Highland  [facility]

## 2015-04-13 NOTE — Plan of Care (Signed)
Problem: Consults Goal: General Medical Patient Education See Patient Education Module for specific education.  Outcome: Not Progressing Pt had dementia, unable to do teaching at this time

## 2015-04-13 NOTE — Progress Notes (Signed)
A fib. Room air. Pt has slept most of the shift. Son at the bedside. Speech consult was added. Pt has not showed any signs of pain.Takes meds crushed in apple sauce. Feeder. Alert to self on.ly. Incontinent. Has a resting shake. Pt has no further concerns at this time.

## 2015-04-13 NOTE — Care Management (Signed)
Patient presents from [facility].  It is anticipated patient will return to the assisted living facility.  Patient is non ambulatory; locomotes by wheelchair and requires total assist with all adls.  Patient may require home health nursing follow up post discharge.

## 2015-04-13 NOTE — H&P (Signed)
[facility] - [place] at [facility]   PATIENT NAME: [name]    MR#:  [id number]  DATE OF BIRTH:  1922/09/05  DATE OF ADMISSION:  04/12/2015  PRIMARY CARE PHYSICIAN: [name], MD   REQUESTING/REFERRING PHYSICIAN: Shaune Pollack  CHIEF COMPLAINT:   Chief Complaint  Patient presents with  . Altered Mental Status    HISTORY OF PRESENT ILLNESS:  [name]  is a 81 y.o. female who presents with acute encephalopathy from her nursing facility [address] [facility] [place]. She also is reported to have significantly elevated blood pressure, although systolic blood pressure greater than 200 while there. Patient is unable to give any history herself. History here is obtained collaterally from the nursing facility and ED staff. Workup in the ED showed tachycardia and tachypnea, with elevated blood pressure. UA was strongly positive for UTI, with nitrites positive and 3+ leukocytes esterase. There is criteria met with tachycardia and tachypnea, and hospitalists were called for admission for sepsis secondary to UTI, as well as the acute encephalopathy. Of note patient does have baseline dementia, but reportedly is more confused at this time than at her baseline.  PAST MEDICAL HISTORY:   Past Medical History  Diagnosis Date  . Anxiety   . HTN (hypertension)   . Atrial fibrillation   . Dementia   . Tremor   . Osteoporosis     prev intolerant of alendronate  . Myocardial infarction   . Anemia   . Hypertension   . Closed left hip fracture   . Arthritis   . DEMENTIA   . Pneumonia   . Hypothyroid   . Hypothyroidism   . Depression     PAST SURGICAL HISTORY:   Past Surgical History  Procedure Laterality Date  . Head surgery      for trigeminal neuralgia  . Total hip arthroplasty      right  . Cataract extraction    . Appendectomy    . Cataract extraction, bilateral    . Rotator cuff repair      left  . Closed reduction humerus fracture      left     SOCIAL HISTORY:   History  Substance Use Topics  . Smoking status: Never Smoker   . Smokeless tobacco: Never Used  . Alcohol Use: No    FAMILY HISTORY:   Family History  Problem Relation Age of Onset  . Cancer    . Cancer Sister     breast cancer  . Cancer Brother     lung cancer    DRUG ALLERGIES:   Allergies  Allergen Reactions  . Alendronate Sodium     unknown  . Aricept [Donepezil Hydrochloride]     unknown  . Warfarin Sodium     Held after fall 12/2011    MEDICATIONS AT HOME:   Prior to Admission medications   Medication Sig Start Date End Date Taking? Authorizing Provider  acetaminophen (TYLENOL) 500 MG tablet Take 2 tablets (1,000 mg total) by mouth 3 (three) times daily. 03/03/15   Joaquim Nam, MD  buPROPion (WELLBUTRIN) 100 MG tablet Take 1 tablet (100 mg total) by mouth every morning. 03/03/15   Joaquim Nam, MD  calcium-vitamin D (OYSTER CALCIUM 500 + D) 500-200 MG-UNIT per tablet Take one every evening. **MUST HAVE PHYSICAL FOR FURTHER REFILLS.** 03/25/15   Joaquim Nam, MD  cholecalciferol (VITAMIN D) 400 UNITS TABS tablet Take 1 tablet (400 Units total) by mouth 2 (two) times daily. 03/10/15  Joaquim Nam, MD  digoxin (LANOXIN) 0.125 MG tablet Take 1 tablet (125 mcg total) by mouth every morning. 01/17/15   Joaquim Nam, MD  docusate (COLACE) 50 MG/5ML liquid Take 10 mLs (100 mg total) by mouth daily. 01/21/15   Joaquim Nam, MD  ferrous sulfate 325 (65 FE) MG tablet Take 1 tablet (325 mg total) by mouth daily with breakfast. 04/12/14   Joaquim Nam, MD  furosemide (LASIX) 20 MG tablet Take one every day. **MUST HAVE PHYSICAL FOR FURTHER REFILLS.** 03/25/15   Joaquim Nam, MD  levothyroxine (SYNTHROID, LEVOTHROID) 50 MCG tablet Take 1 tablet (50 mcg total) by mouth daily. 6 days a week, not on Sundays 11/05/14   Joaquim Nam, MD  metoprolol tartrate (LOPRESSOR) 25 MG tablet Take 1/2 tab let by mouth twice a day. **MUST HAVE PHYSICAL  FOR FURTHER REFILLS.** 03/25/15   Joaquim Nam, MD  Multiple Vitamin (MULTIVITAMIN) tablet Take 1 tablet by mouth every morning. 09/23/14   Joaquim Nam, MD  NUTRITIONAL SUPPLEMENT LIQD Drink one 8 ounce can by mouth between breakfast and lunch and again between lunch and supper 03/09/14   Joaquim Nam, MD  senna (SENOKOT) 8.6 MG TABS tablet Take 1 tablet (8.6 mg total) by mouth every evening. 09/29/14   Joaquim Nam, MD  sertraline (ZOLOFT) 25 MG tablet Take 1 tablet (25 mg total) by mouth every morning. 01/26/15   Joaquim Nam, MD    REVIEW OF SYSTEMS:  Review of Systems  Unable to perform ROS: acuity of condition     VITAL SIGNS:   Filed Vitals:   04/12/15 2100 04/12/15 2115 04/12/15 2139 04/12/15 2145  BP:   163/120   Pulse: 103 95 95 120  Temp:      TempSrc:      Resp: 17 18 28  33  Height:      Weight:      SpO2: 95% 95% 99% 96%   Wt Readings from Last 3 Encounters:  04/12/15 71.668 kg (158 lb)  12/31/13 53.071 kg (117 lb)  12/30/13 53.071 kg (117 lb)    PHYSICAL EXAMINATION:  Physical Exam  Constitutional: She appears well-developed and well-nourished. No distress.  HENT:  Head: Normocephalic and atraumatic.  Mouth/Throat: Oropharynx is clear and moist.  Eyes: Conjunctivae and EOM are normal. Pupils are equal, round, and reactive to light. No scleral icterus.  Neck: Normal range of motion. Neck supple. No JVD present. No thyromegaly present.  Cardiovascular: Intact distal pulses.  Exam reveals no gallop and no friction rub.   No murmur heard. Tachycardic, irregular rhythm.  Respiratory: Effort normal and breath sounds normal. No respiratory distress. She has no wheezes. She has no rales.  GI: Soft. Bowel sounds are normal. She exhibits no distension. There is no tenderness.  Musculoskeletal: Normal range of motion. She exhibits no edema.  No arthritis, no gout  Lymphadenopathy:    She has no cervical adenopathy.  Neurological: No cranial nerve deficit.   No dysarthria, no aphasia  Skin: Skin is warm and dry. No rash noted. No erythema.  Psychiatric:  Could not obtain due to patient condition.    LABORATORY PANEL:   CBC  Recent Labs Lab 04/12/15 2215  WBC 5.4  HGB 15.5  HCT 47.4*  PLT 94*   ------------------------------------------------------------------------------------------------------------------  Chemistries   Recent Labs Lab 04/12/15 2035  NA 148*  K 4.3  CL 106  CO2 26  GLUCOSE 86  BUN 32*  CREATININE  1.05*  CALCIUM 9.8   ------------------------------------------------------------------------------------------------------------------  Cardiac Enzymes No results for input(s): TROPONINI in the last 168 hours. ------------------------------------------------------------------------------------------------------------------  RADIOLOGY:  Dg Chest Port 1 View  04/12/2015   CLINICAL DATA:  Altered mental status.  EXAM: PORTABLE CHEST - 1 VIEW  COMPARISON:  12/01/2013  FINDINGS: Chronic cardiomegaly with tortuosity and calcification of the thoracic aorta. The lungs are clear. Pulmonary vascularity is normal. No acute osseous abnormalities.  IMPRESSION: No acute abnormality.  Chronic cardiomegaly.   Electronically Signed   By: Francene Boyers M.D.   On: 04/12/2015 22:40    EKG:   Orders placed or performed during the hospital encounter of 04/12/15  . ED EKG  . ED EKG    IMPRESSION AND PLAN:  Principal Problem:   Sepsis - secondary to UTI, treatment for UTI as below. Blood culture sent, check lactic acid. Patient's blood pressure is elevated when she came in, but now is having some lower blood pressure readings. Has received 1 L bolus in the ED, continue fluid resuscitation. Antibiotics as below. Active Problems:   UTI (lower urinary tract infection) - IV Rocephin given in the ED, continue this antibiotic for now. Broaden antibiotic if patient decompensates any. Otherwise follow cultures and tailor per  results.   Acute encephalopathy - secondary to UTI superimposed on baseline dementia. No current behavioral disturbance, monitor.   Atrial fibrillation - chronic, does not seem to be in significant RVR at this time. Continue home digoxin, depending on how her blood pressure does we can continue her beta blocker for rate control as well.   Dementia - chronic problem, monitor for now.   HTN (hypertension) - patient's blood pressure was elevated, later readings show it to be lower. Recheck these readings and if accurate hold all antihypertensives. If blood pressure continues to run high, will use IV when necessary antihypertensives.   Hypothyroidism - stable problem, continue home dose of Synthroid.   All the records are reviewed and case discussed with ED provider. Management plans discussed with the patient and/or family.  DVT PROPHYLAXIS: SubQ heparin  ADMISSION STATUS: Inpatient  CODE STATUS: DO NOT RESUSCITATE  TOTAL TIME TAKING CARE OF THIS PATIENT: 50 minutes.    Annalyse Langlais FIELDING 04/13/2015, 12:08 AM  [facility]  Office  [phone]  CC: Primary care physician; [name], MD

## 2015-04-14 LAB — PLATELET COUNT: Platelets: 81 10*3/uL — ABNORMAL LOW (ref 150–440)

## 2015-04-14 MED ORDER — ENOXAPARIN SODIUM 40 MG/0.4ML ~~LOC~~ SOLN
40.0000 mg | SUBCUTANEOUS | Status: DC
  Administered 2015-04-14: 40 mg via SUBCUTANEOUS
  Filled 2015-04-14: qty 0.4

## 2015-04-14 NOTE — Progress Notes (Signed)
Pt transferring to room 152. Report given to Lucy Antiguaeresa Coble, RN.

## 2015-04-14 NOTE — Progress Notes (Signed)
Speech Language Pathology Treatment: Dysphagia  Patient Details Name: [name] MRN: [id number] DOB: 08-12-1922 Today's Date: 04/14/2015 Time: [id number] SLP Time Calculation (min) (ACUTE ONLY): 60 min  Assessment / Plan / Recommendation Clinical Impression  Pt demonstrated no overt s/s of aspiration w/ trials of thin liquids and puree foods given. Pt exhibited a clear vocal quality b/t all trials w/ no decline in respiratory status noted during/post trials; no coughing or throat clearing followed trials. During the oral phase, pt exhibited min. Slower bolus clearing and inconsistent, slight amount of residue which she cleared w/ independent f/u swallows. Pt required full feeding assistance sec. To her declined Cognitive status and overall weakness. Pt continued to present w/ confusion and decreased awareness of surroundings; she required moderate cues to follow through w/ tasks. Pt does not appear ready to attempt an upgrade of diet consistency at this time and would benefit from continuing w/ a Dys. I w/ thin liquids; strict aspiration precautions; meds Crushed in Puree. ST will f/u next 1-3 days for toleration of diet and readiness to upgrade food consistency/trials. NSG consulted.    HPI Other Pertinent Information: per NSG report, pt tolerated po diet last PM w/ no s/s of aspiration indicated. Pt continues to present w/ confusion and requires moderate+ cues.    Pertinent Vitals Pain Assessment: No/denies pain  SLP Plan  Continue with current plan of care    Recommendations Diet recommendations: Dysphagia 1 (puree);Thin liquid Liquids provided via: Cup;Straw Medication Administration: Crushed with puree Supervision: Full supervision/cueing for compensatory strategies;Trained caregiver to feed patient Compensations: Slow rate;Small sips/bites;Multiple dry swallows after each bite/sip Postural Changes and/or Swallow Maneuvers: Seated upright 90 degrees              General  recommendations:  (Dietician consult) Oral Care Recommendations: Oral care BID Follow up Recommendations: Skilled Nursing facility Plan: Continue with current plan of care    GO     Elon Lomeli 04/14/2015, 12:07 PM

## 2015-04-14 NOTE — Progress Notes (Signed)
VSS. Room air. No tele,. Pt has not reported any pain. Takes meds crushed in apple sauce. Confused. Son was at the bedside. Feeder. Incontinent. Bp is under better control. Pt has no further concerns at this time.

## 2015-04-14 NOTE — Progress Notes (Signed)
[facility] PROGRESS NOTE  [name] XBJ:[id number] DOB: [birth year] DOA: 04/12/2015 PCP: [name], MD  HPI/Subjective: Patient more alert today than yesterday. She is unable to recognize her sister at the bedside. She did well enough with speech therapy in order to put on dysphagia diet. Answers some yes or no questions but cannot elaborate at all.  Objective: Filed Vitals:   04/14/15 0732  BP: 157/87  Pulse: 103  Temp:   Resp: 18    Intake/Output Summary (Last 24 hours) at 04/14/15 1055 Last data filed at 04/14/15 0830  Gross per 24 hour  Intake   1680 ml  Output      0 ml  Net   1680 ml   Filed Weights   04/12/15 1815 04/13/15 0207 04/14/15 0651  Weight: 71.668 kg (158 lb) 64.275 kg (141 lb 11.2 oz) 66.429 kg (146 lb 7.2 oz)    ROS: ROS difficult to obtain secondary to altered mental status. Exam: Physical Exam  HENT:  Nose: No mucosal edema.  Mouth/Throat: No oropharyngeal exudate or posterior oropharyngeal edema.  Eyes: Conjunctivae and lids are normal. Pupils are equal, round, and reactive to light.  Neck: No JVD present. Carotid bruit is not present. No edema present. No thyroid mass and no thyromegaly present.  Cardiovascular: S1 normal and S2 normal.  Tachycardia present.  Exam reveals no gallop.   Murmur heard.  Systolic murmur is present with a grade of 2/6  Pulses:      Dorsalis pedis pulses are 2+ on the right side, and 2+ on the left side.  Respiratory: No respiratory distress. She has no wheezes. She has no rhonchi. She has no rales.  GI: Soft. Bowel sounds are normal. There is no tenderness.  Lymphadenopathy:    She has no cervical adenopathy.  Neurological: She is alert.  Skin: Skin is warm. No rash noted. Nails show no clubbing.  Psychiatric: Her mood appears anxious.    Data Reviewed: Basic Metabolic Panel:  Recent Labs Lab 04/12/15 2035 04/13/15 0405  NA 148* 144  K 4.3 3.7  CL 106 109  CO2 26 28  GLUCOSE 86 91   BUN 32* 26*  CREATININE 1.05* 0.95  0.93  CALCIUM 9.8 8.7*   CBC:  Recent Labs Lab 04/12/15 2215 04/13/15 0405 04/14/15 0400  WBC 5.4 5.9  --   NEUTROABS 3.4  --   --   HGB 15.5 14.3  --   HCT 47.4* 43.2  --   MCV 93.9 94.6  --   PLT 94* 86* 81*     Recent Results (from the past 240 hour(s))  Urine culture     Status: None (Preliminary result)   Collection Time: 04/12/15  7:19 PM  Result Value Ref Range Status   Specimen Description URINE, CATHETERIZED  Final   Special Requests NONE  Final   Culture   Final    >=100,000 COLONIES/mL GRAM NEGATIVE RODS IDENTIFICATION AND SUSCEPTIBILITIES TO FOLLOW    Report Status PENDING  Incomplete  Culture, blood (routine x 2)     Status: None (Preliminary result)   Collection Time: 04/12/15 10:15 PM  Result Value Ref Range Status   Specimen Description BLOOD  Final   Special Requests NONE  Final   Culture NO GROWTH < 12 HOURS  Final   Report Status PENDING  Incomplete  Culture, blood (routine x 2)     Status: None (Preliminary result)   Collection Time: 04/12/15 10:25 PM  Result Value Ref  Range Status   Specimen Description BLOOD  Final   Special Requests NONE  Final   Culture NO GROWTH < 12 HOURS  Final   Report Status PENDING  Incomplete     Studies: Dg Chest Port 1 View  04/12/2015   CLINICAL DATA:  Altered mental status.  EXAM: PORTABLE CHEST - 1 VIEW  COMPARISON:  12/01/2013  FINDINGS: Chronic cardiomegaly with tortuosity and calcification of the thoracic aorta. The lungs are clear. Pulmonary vascularity is normal. No acute osseous abnormalities.  IMPRESSION: No acute abnormality.  Chronic cardiomegaly.   Electronically Signed   By: Francene Boyers M.D.   On: 04/12/2015 22:40    Scheduled Meds: . buPROPion  100 mg Oral q morning - 10a  . cefTRIAXone (ROCEPHIN)  IV  1 g Intravenous Q24H  . digoxin  125 mcg Oral q morning - 10a  . heparin  5,000 Units Subcutaneous 3 times per day  . levothyroxine  50 mcg Oral QAC  breakfast  . metoprolol tartrate  12.5 mg Oral BID  . sertraline  25 mg Oral q morning - 10a  . sodium chloride  3 mL Intravenous Q12H   Continuous Infusions: . sodium chloride 100 mL/hr at 04/13/15 1441    Assessment/Plan:  1. Clinical sepsis, acute cystitis with hematuria, tachycardia- patient is on IV Rocephin. Gram negative rods in the UC. Hopefully will have results tomorrow. 2. Accelerated hypertension- better on metoprolol. 3. Acute delirium on top of dementia which was worsened with sepsis. Patient is more alert today. Unable to recognize her sister at the bedside. 4. Hypothyroidism unspecified continue levothyroxine. 5. Dementia without behavioral disturbance on Wellbutrin and Zoloft. 6. Chronic atrial fibrillation on digoxin and low-dose metoprolol.  Code Status:     Code Status Orders        Start     Ordered   04/13/15 0223  Do not attempt resuscitation (DNR)   Continuous    Question Answer Comment  In the event of cardiac or respiratory ARREST Do not call a "code blue"   In the event of cardiac or respiratory ARREST Do not perform Intubation, CPR, defibrillation or ACLS   In the event of cardiac or respiratory ARREST Use medication by any route, position, wound care, and other measures to relive pain and suffering. May use oxygen, suction and manual treatment of airway obstruction as needed for comfort.      04/13/15 0222     Family Communication: Spoke with son on the phone, and sister at bedside Disposition Plan: Back to assisted living, potentially tomorrow once urine culture results are back.  Time spent: 20 minutes  Alford Highland  [facility]

## 2015-04-14 NOTE — Clinical Social Work Psych Note (Signed)
CSW spoke to pt's son Chrissie NoaWilliam [phone].  Per pt's son he may want to transfer pt to a SNF for possible long term care.  He stated that he will not know what he wants to do until tomorrow.   CSW has already spoke to [facility] (ALF)  And they are able to take pt back.  She was almost a total care resident at the ALF per facility staff.  CSW will f/u with pt's son tomorrow.

## 2015-04-15 LAB — URINE CULTURE

## 2015-04-15 MED ORDER — METOPROLOL TARTRATE 25 MG PO TABS
25.0000 mg | ORAL_TABLET | Freq: Two times a day (BID) | ORAL | Status: DC
  Administered 2015-04-15: 25 mg via ORAL
  Filled 2015-04-15: qty 1

## 2015-04-15 MED ORDER — CEPHALEXIN 250 MG/5ML PO SUSR: 250.0000 mg | Freq: Four times a day (QID) | ORAL | Status: DC

## 2015-04-15 MED ORDER — AMLODIPINE BESYLATE 5 MG PO TABS: 5.0000 mg | ORAL_TABLET | Freq: Every day | ORAL | Status: DC

## 2015-04-15 MED ORDER — ASPIRIN EC 81 MG PO TBEC
81.0000 mg | DELAYED_RELEASE_TABLET | Freq: Every day | ORAL | Status: DC
  Administered 2015-04-15: 81 mg via ORAL
  Filled 2015-04-15: qty 1

## 2015-04-15 MED ORDER — METOPROLOL TARTRATE 25 MG PO TABS: 25.0000 mg | ORAL_TABLET | Freq: Two times a day (BID) | ORAL | Status: DC

## 2015-04-15 MED ORDER — AMLODIPINE BESYLATE 5 MG PO TABS
5.0000 mg | ORAL_TABLET | Freq: Every day | ORAL | Status: DC
  Administered 2015-04-15: 5 mg via ORAL
  Filled 2015-04-15: qty 1

## 2015-04-15 MED ORDER — CEPHALEXIN 250 MG/5ML PO SUSR
250.0000 mg | Freq: Four times a day (QID) | ORAL | Status: DC
  Administered 2015-04-15: 250 mg via ORAL
  Filled 2015-04-15 (×4): qty 5

## 2015-04-15 MED ORDER — ACETAMINOPHEN 325 MG PO TABS: 650.0000 mg | ORAL_TABLET | Freq: Four times a day (QID) | ORAL | Status: DC | PRN

## 2015-04-15 NOTE — Discharge Summary (Signed)
[facility] -  at [facility]   PATIENT NAME: [name]    MR#:  [id number]  DATE OF BIRTH:  05/29/1922  DATE OF ADMISSION:  04/12/2015 ADMITTING PHYSICIAN: Oralia Manis, MD  DATE OF DISCHARGE: 04/15/2015  PRIMARY CARE PHYSICIAN: [name], MD    ADMISSION DIAGNOSIS:  Urinary tract infection without hematuria, site unspecified [N39.0]  DISCHARGE DIAGNOSIS:  Principal Problem:   Sepsis Active Problems:   Atrial fibrillation   Dementia   UTI (lower urinary tract infection)   HTN (hypertension)   Hypothyroidism   Acute encephalopathy   SECONDARY DIAGNOSIS:   Past Medical History  Diagnosis Date  . Anxiety   . HTN (hypertension)   . Atrial fibrillation   . Dementia   . Tremor   . Osteoporosis     prev intolerant of alendronate  . Myocardial infarction   . Anemia   . Hypertension   . Closed left hip fracture   . Arthritis   . DEMENTIA   . Pneumonia   . Hypothyroid   . Hypothyroidism   . Depression     HOSPITAL COURSE:   1. Clinical sepsis, acute cystitis with hematuria. Urine culture grew out Escherichia coli which was sensitive to most antibiotics. Patient was on IV Rocephin since being here we'll switch over to by mouth Keflex upon getting out of the hospital. 2. Acute encephalopathy: This is likely acute delirium with underlying dementia secondary to sepsis. Upon discharge the patient did recognize her son. She is able to answer some yes or no questions. She is not able to feed herself. 3. Accelerated hypertension, chronic atrial fibrillation: The patient's metoprolol was increased to 25 mg twice a day and the patient is on digoxin. Aspirin added for anticoagulation. Norvasc added for accelerated hypertension. 4. Dementia without behavioral disturbance. The patient is on Wellbutrin and Zoloft. Patient is full care. Needs assistance with diet. 5. Hypothyroidism unspecified continue levothyroxine.  DISCHARGE CONDITIONS:    Fair  CONSULTS OBTAINED:    none  DRUG ALLERGIES:   Allergies  Allergen Reactions  . Alendronate Sodium Nausea Only  . Aricept [Donepezil Hydrochloride] Other (See Comments)    Reaction:  Unknown   . Warfarin Sodium Other (See Comments)    Held after fall on 12/2011.      DISCHARGE MEDICATIONS:   Current Discharge Medication List    START taking these medications   Details  amLODipine (NORVASC) 5 MG tablet Take 1 tablet (5 mg total) by mouth daily. Qty: 30 tablet, Refills: 0    cephALEXin (KEFLEX) 250 MG/5ML suspension Take 5 mLs (250 mg total) by mouth every 6 (six) hours. For four more days Qty: 80 mL, Refills: 0      CONTINUE these medications which have CHANGED   Details  acetaminophen (TYLENOL) 325 MG tablet Take 2 tablets (650 mg total) by mouth every 6 (six) hours as needed for mild pain (or Fever >/= 101). Qty: 60 tablet, Refills: 0    metoprolol tartrate (LOPRESSOR) 25 MG tablet Take 1 tablet (25 mg total) by mouth 2 (two) times daily. Qty: 60 tablet, Refills: 0      CONTINUE these medications which have NOT CHANGED   Details  buPROPion (WELLBUTRIN) 100 MG tablet Take 1 tablet (100 mg total) by mouth every morning. Qty: 30 tablet, Refills: 0   Associated Diagnoses: Depression    calcium-vitamin D (OYSTER CALCIUM 500 + D) 500-200 MG-UNIT per tablet Take one every evening. **MUST HAVE PHYSICAL FOR FURTHER REFILLS.**  Qty: 30 tablet, Refills: 1    cholecalciferol (VITAMIN D) 400 UNITS TABS tablet Take 1 tablet (400 Units total) by mouth 2 (two) times daily. Qty: 60 each, Refills: 5    digoxin (LANOXIN) 0.125 MG tablet Take 1 tablet (125 mcg total) by mouth every morning. Qty: 30 tablet, Refills: 5    docusate (COLACE) 50 MG/5ML liquid Take 10 mLs (100 mg total) by mouth daily. Qty: 100 mL, Refills: 5    levothyroxine (SYNTHROID, LEVOTHROID) 50 MCG tablet Take 1 tablet (50 mcg total) by mouth daily. 6 days a week, not on Sundays Qty: 90 tablet, Refills:  1    Multiple Vitamin (MULTIVITAMIN) tablet Take 1 tablet by mouth every morning. Qty: 30 tablet, Refills: 11    NUTRITIONAL SUPPLEMENT LIQD Drink one 8 ounce can by mouth between breakfast and lunch and again between lunch and supper    senna (SENOKOT) 8.6 MG TABS tablet Take 1 tablet (8.6 mg total) by mouth every evening. Qty: 120 each, Refills: 1    sertraline (ZOLOFT) 25 MG tablet Take 1 tablet (25 mg total) by mouth every morning. Qty: 30 tablet, Refills: 5      STOP taking these medications     furosemide (LASIX) 20 MG tablet          DISCHARGE INSTRUCTIONS:   Follow-up at [facility] in one week.  If you experience worsening of your admission symptoms, develop shortness of breath, life threatening emergency, suicidal or homicidal thoughts you must seek medical attention immediately by calling 911 or calling your MD immediately  if symptoms less severe.  You Must read complete instructions/literature along with all the possible adverse reactions/side effects for all the Medicines you take and that have been prescribed to you. Take any new Medicines after you have completely understood and accept all the possible adverse reactions/side effects.   Please note  You were cared for by a hospitalist during your hospital stay. If you have any questions about your discharge medications or the care you received while you were in the hospital after you are discharged, you can call the unit and asked to speak with the hospitalist on call if the hospitalist that took care of you is not available. Once you are discharged, your primary care physician will handle any further medical issues. Please note that NO REFILLS for any discharge medications will be authorized once you are discharged, as it is imperative that you return to your primary care physician (or establish a relationship with a primary care physician if you do not have one) for your aftercare needs so that they can reassess  your need for medications and monitor your lab values.    Today   CHIEF COMPLAINT:   Chief Complaint  Patient presents with  . Altered Mental Status    HISTORY OF PRESENT ILLNESS:  [name]  is a 81 y.o. female with a known history of dementia. Presented with worsening mental status. Found to have clinical sepsis and urinary tract infection.   VITAL SIGNS:  Blood pressure 168/87, pulse 129, temperature 99.3 F (37.4 C), temperature source Oral, resp. rate 17, height 5\' 4"  (1.626 m), weight 68.675 kg (151 lb 6.4 oz), SpO2 93 %.  I/O:   Intake/Output Summary (Last 24 hours) at 04/15/15 1026 Last data filed at 04/14/15 1630  Gross per 24 hour  Intake      0 ml  Output      0 ml  Net  0 ml    PHYSICAL EXAMINATION:  GENERAL:  81 y.o.-year-old patient lying in the bed with no acute distress.  EYES: Pupils equal, round, reactive to light and accommodation. No scleral icterus. Extraocular muscles intact.  HEENT: Head atraumatic, normocephalic. Oropharynx and nasopharynx clear.  NECK:  Supple, no jugular venous distention. No thyroid enlargement, no tenderness.  LUNGS: Normal breath sounds bilaterally, no wheezing, rales,rhonchi or crepitation. No use of accessory muscles of respiration.  CARDIOVASCULAR: S1, S2 irregularly irregular tachycardic. No murmurs, rubs, or gallops.  ABDOMEN: Soft, non-tender, non-distended. Bowel sounds present. No organomegaly or mass.  EXTREMITIES: No pedal edema, cyanosis, or clubbing.  NEUROLOGIC: Alert.  PSYCHIATRIC: Alert and answers some questions. SKIN: No obvious rash, lesion, or ulcer.   DATA REVIEW:   CBC  Recent Labs Lab 04/13/15 0405 04/14/15 0400  WBC 5.9  --   HGB 14.3  --   HCT 43.2  --   PLT 86* 81*    Chemistries   Recent Labs Lab 04/13/15 0405  NA 144  K 3.7  CL 109  CO2 28  GLUCOSE 91  BUN 26*  CREATININE 0.95  0.93  CALCIUM 8.7*    Management plans discussed with the patient, family and they are  in agreement.  CODE STATUS:     Code Status Orders        Start     Ordered   04/13/15 0223  Do not attempt resuscitation (DNR)   Continuous    Question Answer Comment  In the event of cardiac or respiratory ARREST Do not call a "code blue"   In the event of cardiac or respiratory ARREST Do not perform Intubation, CPR, defibrillation or ACLS   In the event of cardiac or respiratory ARREST Use medication by any route, position, wound care, and other measures to relive pain and suffering. May use oxygen, suction and manual treatment of airway obstruction as needed for comfort.      04/13/15 0222      TOTAL TIME TAKING CARE OF THIS PATIENT: 40 minutes.    Alford Highland M.D on 04/15/2015 at 10:26 AM  Between 7am to 6pm - Pager - [phone]  After 6pm go to [email or web address] - password [email or web address] [facility]  [place]  [facility]  Office  [phone]  CC: Primary care physician; [name], MD

## 2015-04-15 NOTE — Progress Notes (Signed)
Notified Dr Sheryle Hailiamond of elevated blood pressure of 190/131.  Give am dose of lopressor 12.5 mg now

## 2015-04-15 NOTE — Progress Notes (Signed)
Patient is medically stable for D/C back to [facility]. [facility] Zenaida NieceVan driver will pick patient up at 1 pm this [facility]. RN will call [facility]. Clinical Child psychotherapistocial Worker (CSW) prepared D/C packet and faxed D/C Summary and FL2 to [facility]. Patient's son Chrissie NoaWilliam is at bedside and aware of above. Please reconsult if future social work needs arise. CSW signing off.   Jetta LoutBailey Morgan, LCSWA [phone]

## 2015-04-15 NOTE — Progress Notes (Signed)
Pt being discharged to [facility] & they Joyce Gross(Kay) will pick her up with the van at 1pm at the visitors entrance. Son is here with her this morning. Belongings packed, IV removed. [facility]. VSS at this time.

## 2015-04-17 LAB — CULTURE, BLOOD (ROUTINE X 2)
CULTURE: NO GROWTH
Culture: NO GROWTH

## 2015-04-20 ENCOUNTER — Telehealth: Payer: Self-pay

## 2015-04-20 NOTE — Telephone Encounter (Signed)
Restart lasix 20mg  a day.  Take tylenol 500mg  tid.  Both scheduled.  Please add to med list, send rx for #90 lasix w/1 RF if needed.   Have them update us if not improved with meds.  Thanks.

## 2015-04-20 NOTE — Telephone Encounter (Signed)
I'll check it as soon as I can.  Thanks.

## 2015-04-20 NOTE — Telephone Encounter (Signed)
Fax taken off of machine late yesterday and is on your desk now.

## 2015-04-20 NOTE — Telephone Encounter (Signed)
[facility]m requesting cb about fax that was sent to Dr Para Marchuncan on 04/19/15 about pt's hands being swollen,med changes on med list concerns, pt is not on lasix at this time. pts son is very concerned and MoroccoKat request cb [phone]. Did Dr Para Marchuncan receive fax?

## 2015-04-20 NOTE — Telephone Encounter (Signed)
Spoke to [facility] and was advised that since she came back on May 20 they have noticed that both hands have been swollen, but right hand is worse. Georgiann HahnKat stated that the discharge summary stated to stop the lasix. Patient complained with her right hand hurting yesterday and was given prn tylenol. Georgiann HahnKat stated that today hands are swollen, but not as bad as yesterday. Patient's son stated that patient has an appointment with Dr. Para Marchuncan Friday. Georgiann HahnKat stated that she will fax the discharge summary over.

## 2015-04-20 NOTE — Telephone Encounter (Signed)
Completed form faxed back to [place] [facility] [place].  Kat notified as instructed by telephone and verbalized understanding. Georgiann HahnKat stated that a script does not need to be sent in as long as it is written on the faxed form sent back to them. Medication list updated as instructed.

## 2015-04-20 NOTE — Telephone Encounter (Signed)
I won't be at clinic until 2 PM, I won't see the hard copy before then.  Please see what you can find out in the meantime.

## 2015-04-20 NOTE — Telephone Encounter (Signed)
[facility] is on your desk.

## 2015-04-22 ENCOUNTER — Ambulatory Visit (INDEPENDENT_AMBULATORY_CARE_PROVIDER_SITE_OTHER): Payer: Medicare Other | Admitting: Family Medicine

## 2015-04-22 ENCOUNTER — Encounter: Payer: Self-pay | Admitting: Family Medicine

## 2015-04-22 VITALS — BP 128/72 | HR 64 | Temp 98.4°F

## 2015-04-22 DIAGNOSIS — F0390 Unspecified dementia without behavioral disturbance: Secondary | ICD-10-CM | POA: Diagnosis not present

## 2015-04-22 NOTE — Patient Instructions (Signed)
Take 20mg  of lasix a day except for 40mg  on 04/23/15 and 04/24/15.  Resume 20mg  a day after that.  Update me about the hand swelling next week.  If you need me to remove the ring, then let me know- I may need to get extra hardware here in the clinic ahead of time.  Take care.  Glad to see you.

## 2015-04-22 NOTE — Progress Notes (Signed)
Pre visit review using our clinic review tool, if applicable. No additional management support is needed unless otherwise documented below in the visit note.  Admitted with UTI, treated but lasix stopped, now with inc in B hand, R>L, edema.  R 4th finger ring is tighter than normal.  Mentation back to normal for patient, but dementia at baseline.  No fevers.  No complaints from patient, but she only states a few words at a time.  Labs and inpatient records reviewed, esp re: med changes (ie lasix).  Here with son at OV.   Meds, vitals, and allergies reviewed.   ROS: See HPI.  Otherwise, noncontributory.  nad In wheelchair, speaks a few words a at a time.  Can follow simple one step commands.  Will occ grin when I talk to her.  Mmm Neck supple, no LA rrr ctab abd soft BLE edema, 1+.  R>L hand edema noted.  R4th ring is unable to move across the PIP for removal, but she has normal coloration of the finger and nail bed.  I can rotated the ring and it isn't functioning as a tourniquet.

## 2015-04-25 ENCOUNTER — Encounter: Payer: Self-pay | Admitting: Family Medicine

## 2015-04-25 NOTE — Assessment & Plan Note (Signed)
Continues, with episodic worsening from acute illnesses,  D/w pt's son.  He understands this is progressive.  Would continue meds as is for now except for short duration of inc dose of lasix (40mg  a day for 2 days) then return to 20mg  a day.  If they need ring removed later on we can do so.  If emergent, to ER.  Wouldn't try to remove now as not emergent, and would be easier to remove after inc dose of lasix.  He agrees. >25 minutes spent in face to face time with patient, >50% spent in counselling or coordination of care.

## 2015-04-26 ENCOUNTER — Other Ambulatory Visit: Payer: Self-pay | Admitting: *Deleted

## 2015-04-26 MED ORDER — SENNA 8.6 MG PO TABS: 1.0000 | ORAL_TABLET | Freq: Every evening | ORAL | Status: DC

## 2015-04-26 NOTE — Telephone Encounter (Signed)
Received faxed refill request from pharmacy for Senna. Refill sent to pharmacy electronically.

## 2015-05-03 ENCOUNTER — Telehealth: Payer: Self-pay | Admitting: *Deleted

## 2015-05-03 NOTE — Telephone Encounter (Signed)
Received fax from Johny Saxatherine Harding, [facility] [place]:  Resident observed laying on the floor on her right side of her body beside her wheelchair.  No injuries noted, no complaints of pain.  Please advise. Fax placed in Dr. Timoteo ExposeG's In Box in Dr. Lianne Bushyuncan's absence.  This note sent to Dr. Para Marchuncan and Dr. Reece AgarG.

## 2015-05-04 NOTE — Telephone Encounter (Signed)
No injury noted.  Not on blood thinner. Monitor for new or changing sxs and update us if this happens. Form placed in my out box to fax back to [facility].

## 2015-05-04 NOTE — Telephone Encounter (Signed)
Noted, thanks!

## 2015-05-04 NOTE — Telephone Encounter (Signed)
Left message for Johny Saxatherine Harding, RN to return my call.

## 2015-05-09 ENCOUNTER — Emergency Department: Payer: Medicare Other

## 2015-05-09 ENCOUNTER — Telehealth: Payer: Self-pay | Admitting: *Deleted

## 2015-05-09 ENCOUNTER — Other Ambulatory Visit: Payer: Self-pay

## 2015-05-09 ENCOUNTER — Inpatient Hospital Stay
Admission: EM | Admit: 2015-05-09 | Discharge: 2015-05-12 | DRG: 871 | Disposition: A | Discharge status: Hospice | Payer: Medicare Other | Attending: Internal Medicine | Admitting: Internal Medicine

## 2015-05-09 ENCOUNTER — Encounter: Payer: Self-pay | Admitting: Emergency Medicine

## 2015-05-09 DIAGNOSIS — B9689 Other specified bacterial agents as the cause of diseases classified elsewhere: Secondary | ICD-10-CM | POA: Diagnosis present

## 2015-05-09 DIAGNOSIS — F0391 Unspecified dementia with behavioral disturbance: Secondary | ICD-10-CM | POA: Diagnosis present

## 2015-05-09 DIAGNOSIS — Z96641 Presence of right artificial hip joint: Secondary | ICD-10-CM | POA: Diagnosis present

## 2015-05-09 DIAGNOSIS — F329 Major depressive disorder, single episode, unspecified: Secondary | ICD-10-CM | POA: Diagnosis present

## 2015-05-09 DIAGNOSIS — A4150 Gram-negative sepsis, unspecified: Secondary | ICD-10-CM | POA: Diagnosis not present

## 2015-05-09 DIAGNOSIS — I2510 Atherosclerotic heart disease of native coronary artery without angina pectoris: Secondary | ICD-10-CM | POA: Diagnosis present

## 2015-05-09 DIAGNOSIS — E039 Hypothyroidism, unspecified: Secondary | ICD-10-CM | POA: Diagnosis present

## 2015-05-09 DIAGNOSIS — M810 Age-related osteoporosis without current pathological fracture: Secondary | ICD-10-CM | POA: Diagnosis present

## 2015-05-09 DIAGNOSIS — I10 Essential (primary) hypertension: Secondary | ICD-10-CM | POA: Diagnosis present

## 2015-05-09 DIAGNOSIS — F419 Anxiety disorder, unspecified: Secondary | ICD-10-CM | POA: Diagnosis present

## 2015-05-09 DIAGNOSIS — A419 Sepsis, unspecified organism: Secondary | ICD-10-CM | POA: Diagnosis present

## 2015-05-09 DIAGNOSIS — Z515 Encounter for palliative care: Secondary | ICD-10-CM | POA: Diagnosis not present

## 2015-05-09 DIAGNOSIS — Z66 Do not resuscitate: Secondary | ICD-10-CM | POA: Diagnosis present

## 2015-05-09 DIAGNOSIS — B9620 Unspecified Escherichia coli [E. coli] as the cause of diseases classified elsewhere: Secondary | ICD-10-CM | POA: Diagnosis present

## 2015-05-09 DIAGNOSIS — I252 Old myocardial infarction: Secondary | ICD-10-CM | POA: Diagnosis not present

## 2015-05-09 DIAGNOSIS — Z993 Dependence on wheelchair: Secondary | ICD-10-CM

## 2015-05-09 DIAGNOSIS — G309 Alzheimer's disease, unspecified: Secondary | ICD-10-CM | POA: Diagnosis present

## 2015-05-09 DIAGNOSIS — D696 Thrombocytopenia, unspecified: Secondary | ICD-10-CM | POA: Diagnosis present

## 2015-05-09 DIAGNOSIS — N390 Urinary tract infection, site not specified: Secondary | ICD-10-CM

## 2015-05-09 DIAGNOSIS — N3000 Acute cystitis without hematuria: Secondary | ICD-10-CM | POA: Diagnosis present

## 2015-05-09 DIAGNOSIS — I4891 Unspecified atrial fibrillation: Secondary | ICD-10-CM | POA: Diagnosis present

## 2015-05-09 DIAGNOSIS — N170 Acute kidney failure with tubular necrosis: Secondary | ICD-10-CM | POA: Diagnosis present

## 2015-05-09 LAB — URINALYSIS COMPLETE WITH MICROSCOPIC (ARMC ONLY)
Bilirubin Urine: NEGATIVE
Glucose, UA: NEGATIVE mg/dL
Ketones, ur: NEGATIVE mg/dL
NITRITE: NEGATIVE
PH: 5 (ref 5.0–8.0)
Protein, ur: >500 mg/dL — AB
Specific Gravity, Urine: 1.016 (ref 1.005–1.030)

## 2015-05-09 LAB — COMPREHENSIVE METABOLIC PANEL
ALT: 19 U/L (ref 14–54)
ANION GAP: 12 (ref 5–15)
AST: 54 U/L — ABNORMAL HIGH (ref 15–41)
Albumin: 3.8 g/dL (ref 3.5–5.0)
Alkaline Phosphatase: 78 U/L (ref 38–126)
BUN: 34 mg/dL — AB (ref 6–20)
CALCIUM: 9.6 mg/dL (ref 8.9–10.3)
CO2: 27 mmol/L (ref 22–32)
Chloride: 103 mmol/L (ref 101–111)
Creatinine, Ser: 1.59 mg/dL — ABNORMAL HIGH (ref 0.44–1.00)
GFR calc non Af Amer: 27 mL/min — ABNORMAL LOW (ref >60)
GFR, EST AFRICAN AMERICAN: 32 mL/min — AB (ref >60)
GLUCOSE: 146 mg/dL — AB (ref 65–99)
Potassium: 4.4 mmol/L (ref 3.5–5.1)
SODIUM: 142 mmol/L (ref 135–145)
Total Bilirubin: 1 mg/dL (ref 0.3–1.2)
Total Protein: 7.7 g/dL (ref 6.5–8.1)

## 2015-05-09 LAB — CBC WITH DIFFERENTIAL/PLATELET
BASOS ABS: 0 10*3/uL (ref 0–0.1)
Eosinophils Absolute: 0 10*3/uL (ref 0–0.7)
Eosinophils Relative: 0 %
HCT: 46 % (ref 35.0–47.0)
Hemoglobin: 14.7 g/dL (ref 12.0–16.0)
Lymphocytes Relative: 5 %
Lymphs Abs: 1.1 10*3/uL (ref 1.0–3.6)
MCH: 30.1 pg (ref 26.0–34.0)
MCHC: 32 g/dL (ref 32.0–36.0)
MCV: 94.3 fL (ref 80.0–100.0)
Monocytes Absolute: 1.9 10*3/uL — ABNORMAL HIGH (ref 0.2–0.9)
Monocytes Relative: 8 %
NEUTROS ABS: 19.6 10*3/uL — AB (ref 1.4–6.5)
Platelets: 75 10*3/uL — ABNORMAL LOW (ref 150–440)
RBC: 4.88 MIL/uL (ref 3.80–5.20)
RDW: 14.3 % (ref 11.5–14.5)
WBC: 22.6 10*3/uL — ABNORMAL HIGH (ref 3.6–11.0)

## 2015-05-09 LAB — CREATININE, SERUM
CREATININE: 1.44 mg/dL — AB (ref 0.44–1.00)
GFR calc Af Amer: 36 mL/min — ABNORMAL LOW (ref >60)
GFR, EST NON AFRICAN AMERICAN: 31 mL/min — AB (ref >60)

## 2015-05-09 LAB — CBC
HEMATOCRIT: 42.3 % (ref 35.0–47.0)
HEMOGLOBIN: 13.7 g/dL (ref 12.0–16.0)
MCH: 30.3 pg (ref 26.0–34.0)
MCHC: 32.4 g/dL (ref 32.0–36.0)
MCV: 93.3 fL (ref 80.0–100.0)
PLATELETS: 61 10*3/uL — AB (ref 150–440)
RBC: 4.54 MIL/uL (ref 3.80–5.20)
RDW: 14.1 % (ref 11.5–14.5)
WBC: 17.1 10*3/uL — ABNORMAL HIGH (ref 3.6–11.0)

## 2015-05-09 LAB — LACTIC ACID, PLASMA
LACTIC ACID, VENOUS: 3.9 mmol/L — AB (ref 0.5–2.0)
LACTIC ACID, VENOUS: 5 mmol/L — AB (ref 0.5–2.0)
Lactic Acid, Venous: 4.7 mmol/L (ref 0.5–2.0)
Lactic Acid, Venous: 3 mmol/L (ref 0.5–2.0)

## 2015-05-09 LAB — TROPONIN I: Troponin I: 0.04 ng/mL — ABNORMAL HIGH (ref <0.031)

## 2015-05-09 LAB — MRSA PCR SCREENING: MRSA by PCR: POSITIVE — AB

## 2015-05-09 MED ORDER — SENNA 8.6 MG PO TABS: 1.0000 | ORAL_TABLET | Freq: Every evening | ORAL | Status: DC

## 2015-05-09 MED ORDER — ACETAMINOPHEN 325 MG PO TABS: 650.0000 mg | ORAL_TABLET | Freq: Four times a day (QID) | ORAL | Status: DC | PRN

## 2015-05-09 MED ORDER — DILTIAZEM HCL 25 MG/5ML IV SOLN
10.0000 mg | Freq: Once | INTRAVENOUS | Status: AC
  Administered 2015-05-09: 10 mg via INTRAVENOUS

## 2015-05-09 MED ORDER — DIGOXIN 0.25 MG/ML IJ SOLN
0.1250 mg | Freq: Every day | INTRAMUSCULAR | Status: DC
  Administered 2015-05-09 – 2015-05-12 (×4): 0.125 mg via INTRAVENOUS
  Filled 2015-05-09 (×3): qty 2

## 2015-05-09 MED ORDER — DILTIAZEM HCL 25 MG/5ML IV SOLN
INTRAVENOUS | Status: AC
Start: 2015-05-09 — End: 2015-05-09
  Administered 2015-05-09: 10 mg via INTRAVENOUS
  Filled 2015-05-09: qty 5

## 2015-05-09 MED ORDER — BUPROPION HCL 100 MG PO TABS
100.0000 mg | ORAL_TABLET | ORAL | Status: DC
  Filled 2015-05-09 (×2): qty 1

## 2015-05-09 MED ORDER — CEFTRIAXONE SODIUM IN DEXTROSE 20 MG/ML IV SOLN
1.0000 g | INTRAVENOUS | Status: DC
  Administered 2015-05-09: 1 g via INTRAVENOUS
  Filled 2015-05-09: qty 50

## 2015-05-09 MED ORDER — HYDRALAZINE HCL 20 MG/ML IJ SOLN
INTRAMUSCULAR | Status: AC
  Administered 2015-05-09: 10 mg via INTRAVENOUS
  Filled 2015-05-09: qty 1

## 2015-05-09 MED ORDER — ACETAMINOPHEN 650 MG RE SUPP
650.0000 mg | Freq: Once | RECTAL | Status: AC
  Administered 2015-05-09: 650 mg via RECTAL

## 2015-05-09 MED ORDER — SODIUM CHLORIDE 0.9 % IV BOLUS (SEPSIS)
500.0000 mL | INTRAVENOUS | Status: AC
  Administered 2015-05-09: 1000 mL via INTRAVENOUS

## 2015-05-09 MED ORDER — DOCUSATE SODIUM 50 MG/5ML PO LIQD: 100.0000 mg | Freq: Every day | ORAL | Status: DC

## 2015-05-09 MED ORDER — SODIUM CHLORIDE 0.9 % IV SOLN
1.0000 g | INTRAVENOUS | Status: DC
  Administered 2015-05-09: 1 g via INTRAVENOUS
  Filled 2015-05-09: qty 1

## 2015-05-09 MED ORDER — ALBUTEROL SULFATE (2.5 MG/3ML) 0.083% IN NEBU: 2.5000 mg | INHALATION_SOLUTION | RESPIRATORY_TRACT | Status: DC | PRN

## 2015-05-09 MED ORDER — ACETAMINOPHEN 650 MG RE SUPP
650.0000 mg | Freq: Four times a day (QID) | RECTAL | Status: DC | PRN
  Filled 2015-05-09 (×2): qty 1

## 2015-05-09 MED ORDER — METOPROLOL TARTRATE 25 MG PO TABS
25.0000 mg | ORAL_TABLET | Freq: Three times a day (TID) | ORAL | Status: DC
  Filled 2015-05-09: qty 1

## 2015-05-09 MED ORDER — CEFTRIAXONE SODIUM IN DEXTROSE 20 MG/ML IV SOLN
INTRAVENOUS | Status: AC
  Administered 2015-05-09: 1 g via INTRAVENOUS
  Filled 2015-05-09: qty 50

## 2015-05-09 MED ORDER — ADULT MULTIVITAMIN W/MINERALS CH: 1.0000 | ORAL_TABLET | ORAL | Status: DC

## 2015-05-09 MED ORDER — CALCIUM CARBONATE-VITAMIN D 500-200 MG-UNIT PO TABS: 1.0000 | ORAL_TABLET | Freq: Every evening | ORAL | Status: DC

## 2015-05-09 MED ORDER — CHLORHEXIDINE GLUCONATE CLOTH 2 % EX PADS
6.0000 | MEDICATED_PAD | Freq: Every day | CUTANEOUS | Status: DC
  Administered 2015-05-10 – 2015-05-12 (×3): 6 via TOPICAL

## 2015-05-09 MED ORDER — LEVOTHYROXINE SODIUM 50 MCG PO TABS: 50.0000 ug | ORAL_TABLET | Freq: Every day | ORAL | Status: DC

## 2015-05-09 MED ORDER — DIGOXIN 0.25 MG/ML IJ SOLN
INTRAMUSCULAR | Status: AC
  Filled 2015-05-09: qty 2

## 2015-05-09 MED ORDER — DILTIAZEM HCL 25 MG/5ML IV SOLN
INTRAVENOUS | Status: AC
  Filled 2015-05-09: qty 5

## 2015-05-09 MED ORDER — HEPARIN SODIUM (PORCINE) 5000 UNIT/ML IJ SOLN
5000.0000 [IU] | Freq: Three times a day (TID) | INTRAMUSCULAR | Status: DC
  Administered 2015-05-09 – 2015-05-11 (×6): 5000 [IU] via SUBCUTANEOUS
  Filled 2015-05-09 (×6): qty 1

## 2015-05-09 MED ORDER — AMLODIPINE BESYLATE 5 MG PO TABS: 5.0000 mg | ORAL_TABLET | Freq: Every day | ORAL | Status: DC

## 2015-05-09 MED ORDER — ACETAMINOPHEN 650 MG RE SUPP
RECTAL | Status: AC
  Administered 2015-05-09: 650 mg via RECTAL
  Filled 2015-05-09: qty 1

## 2015-05-09 MED ORDER — SERTRALINE HCL 50 MG PO TABS: 25.0000 mg | ORAL_TABLET | ORAL | Status: DC

## 2015-05-09 MED ORDER — SODIUM CHLORIDE 0.9 % IV BOLUS (SEPSIS)
1000.0000 mL | INTRAVENOUS | Status: AC
  Administered 2015-05-09 (×2): 1000 mL via INTRAVENOUS

## 2015-05-09 MED ORDER — VANCOMYCIN HCL IN DEXTROSE 1-5 GM/200ML-% IV SOLN
1000.0000 mg | Freq: Once | INTRAVENOUS | Status: AC
  Administered 2015-05-09: 1000 mg via INTRAVENOUS

## 2015-05-09 MED ORDER — PIPERACILLIN-TAZOBACTAM 3.375 G IVPB 30 MIN
3.3750 g | Freq: Once | INTRAVENOUS | Status: AC
  Administered 2015-05-09: 3.375 g via INTRAVENOUS

## 2015-05-09 MED ORDER — PIPERACILLIN-TAZOBACTAM 3.375 G IVPB
INTRAVENOUS | Status: AC
  Administered 2015-05-09: 3.375 g via INTRAVENOUS
  Filled 2015-05-09: qty 50

## 2015-05-09 MED ORDER — METOPROLOL TARTRATE 1 MG/ML IV SOLN
INTRAVENOUS | Status: AC
  Administered 2015-05-09: 5 mg via INTRAVENOUS
  Filled 2015-05-09: qty 5

## 2015-05-09 MED ORDER — VANCOMYCIN HCL IN DEXTROSE 1-5 GM/200ML-% IV SOLN
INTRAVENOUS | Status: AC
  Administered 2015-05-09: 1000 mg via INTRAVENOUS
  Filled 2015-05-09: qty 200

## 2015-05-09 MED ORDER — METOPROLOL TARTRATE 1 MG/ML IV SOLN
5.0000 mg | INTRAVENOUS | Status: DC | PRN
Start: 2015-05-09 — End: 2015-05-12
  Administered 2015-05-09 – 2015-05-12 (×2): 5 mg via INTRAVENOUS
  Filled 2015-05-09 (×4): qty 5

## 2015-05-09 MED ORDER — HYDRALAZINE HCL 20 MG/ML IJ SOLN
10.0000 mg | Freq: Four times a day (QID) | INTRAMUSCULAR | Status: DC | PRN
  Administered 2015-05-09: 10 mg via INTRAVENOUS

## 2015-05-09 MED ORDER — CHOLECALCIFEROL 10 MCG (400 UNIT) PO TABS
400.0000 [IU] | ORAL_TABLET | Freq: Two times a day (BID) | ORAL | Status: DC
  Filled 2015-05-09 (×3): qty 1

## 2015-05-09 MED ORDER — SODIUM CHLORIDE 0.9 % IV SOLN
INTRAVENOUS | Status: DC
  Administered 2015-05-09 – 2015-05-11 (×5): via INTRAVENOUS

## 2015-05-09 MED ORDER — DOCUSATE SODIUM 100 MG PO CAPS: 100.0000 mg | ORAL_CAPSULE | Freq: Two times a day (BID) | ORAL | Status: DC | PRN

## 2015-05-09 MED ORDER — DIGOXIN 125 MCG PO TABS: 125.0000 ug | ORAL_TABLET | ORAL | Status: DC

## 2015-05-09 MED ORDER — SENNA 8.6 MG PO TABS
1.0000 | ORAL_TABLET | Freq: Every day | ORAL | Status: DC | PRN
Start: 2015-05-09 — End: 2015-05-12

## 2015-05-09 MED ORDER — MUPIROCIN 2 % EX OINT
1.0000 "application " | TOPICAL_OINTMENT | Freq: Two times a day (BID) | CUTANEOUS | Status: DC
  Administered 2015-05-09 – 2015-05-12 (×6): 1 via NASAL
  Filled 2015-05-09: qty 22

## 2015-05-09 NOTE — ED Notes (Signed)
Per son she had a UTI a few weeks ago  And was acting the same at that time

## 2015-05-09 NOTE — Progress Notes (Signed)
PT Cancellation Note  Patient Details Name: [name] MRN: [id number] DOB: Apr 07, 1922   Cancelled Treatment:    Reason Eval/Treat Not Completed: Medical issues which prohibited therapy (Nursing recommending holding PT today d/t pt not appropriate to work with today).  Will re-attempt PT eval at a later date/time as medically appropriate.   [name] 05/09/2015, 4:00 PM Hendricks Limes, PT [phone]

## 2015-05-09 NOTE — ED Provider Notes (Signed)
[facility] Emergency Department Provider Note    ____________________________________________  Time seen: 0920  I have reviewed the triage vital signs and the nursing notes.   HISTORY  Chief Complaint No chief complaint on file.   History limited by: dementia   HPI [name] is a 81 y.o. female who presents from living facility today because of altered mental status. Per EMS report this was discovered by nursing home staff roughly 1-1/2 hours ago. He also stated that the patient did not want to eat this morning and they question and some increased right-sided flaccidness. Unfortunately patient is severely demented and is unable to give any history.   Past Medical History  Diagnosis Date  . Anxiety   . HTN (hypertension)   . Atrial fibrillation   . Dementia   . Tremor   . Osteoporosis     prev intolerant of alendronate  . Myocardial infarction   . Anemia   . Hypertension   . Closed left hip fracture   . Arthritis   . DEMENTIA   . Pneumonia   . Hypothyroid   . Hypothyroidism   . Depression     Patient Active Problem List   Diagnosis Date Noted  . Acute encephalopathy 04/12/2015  . Sepsis 04/12/2015  . Knee pain 04/26/2014  . Skin lesion 01/31/2014  . Thrombocytopenia, unspecified 12/31/2013  . DNR (do not resuscitate) 06/18/2013  . Advanced directives, counseling/discussion 12/18/2012  . Orthostatic hypotension 08/25/2012  . Thrombocytopenia 05/20/2012  . Hip fracture, intertrochanteric 05/17/2012  . Osteoporosis 05/17/2012  . Hypothyroidism 05/17/2012  . Rhabdomyolysis 01/01/2012  . Syncope and collapse 12/31/2011  . UTI (lower urinary tract infection) 12/31/2011  . HTN (hypertension) 12/31/2011  . Dementia 08/26/2011  . Tremor 08/26/2011  . Anemia 08/26/2011  . History of pneumonia 08/26/2011  . Spinal stenosis 08/26/2011  . Hip fracture, left 08/26/2011  . Long term (current) use of anticoagulants 07/17/2011  . Atrial  fibrillation 07/13/2011    Past Surgical History  Procedure Laterality Date  . Head surgery      for trigeminal neuralgia  . Total hip arthroplasty      right  . Cataract extraction    . Appendectomy    . Cataract extraction, bilateral    . Rotator cuff repair      left  . Closed reduction humerus fracture      left    Current Outpatient Rx  Name  Route  Sig  Dispense  Refill  . acetaminophen (TYLENOL) 325 MG tablet   Oral   Take 2 tablets (650 mg total) by mouth every 6 (six) hours as needed for mild pain (or Fever >/= 101).   60 tablet   0   . amLODipine (NORVASC) 5 MG tablet   Oral   Take 1 tablet (5 mg total) by mouth daily.   30 tablet   0   . buPROPion (WELLBUTRIN) 100 MG tablet   Oral   Take 1 tablet (100 mg total) by mouth every morning.   30 tablet   0   . calcium-vitamin D (OYSTER CALCIUM 500 + D) 500-200 MG-UNIT per tablet      Take one every evening. **MUST HAVE PHYSICAL FOR FURTHER REFILLS.** Patient taking differently: Take 1 tablet by mouth every evening.    30 tablet   1   . cholecalciferol (VITAMIN D) 400 UNITS TABS tablet   Oral   Take 1 tablet (400 Units total) by mouth 2 (two) times  daily.   60 each   5   . digoxin (LANOXIN) 0.125 MG tablet   Oral   Take 1 tablet (125 mcg total) by mouth every morning.   30 tablet   5   . docusate (COLACE) 50 MG/5ML liquid   Oral   Take 10 mLs (100 mg total) by mouth daily.   100 mL   5   . furosemide (LASIX) 20 MG tablet   Oral   Take 20 mg by mouth daily.         Marland Kitchen levothyroxine (SYNTHROID, LEVOTHROID) 50 MCG tablet   Oral   Take 1 tablet (50 mcg total) by mouth daily. 6 days a week, not on Sundays   90 tablet   1   . metoprolol tartrate (LOPRESSOR) 25 MG tablet   Oral   Take 1 tablet (25 mg total) by mouth 2 (two) times daily.   60 tablet   0   . Multiple Vitamin (MULTIVITAMIN) tablet   Oral   Take 1 tablet by mouth every morning.   30 tablet   11   . NUTRITIONAL SUPPLEMENT  LIQD      Drink one 8 ounce can by mouth between breakfast and lunch and again between lunch and supper Patient taking differently: Take 8 oz by mouth 2 (two) times daily. Pt drinks one can between breakfast and lunch and again between lunch and supper.         . senna (SENOKOT) 8.6 MG TABS tablet   Oral   Take 1 tablet (8.6 mg total) by mouth every evening.   120 each   1   . sertraline (ZOLOFT) 25 MG tablet   Oral   Take 1 tablet (25 mg total) by mouth every morning.   30 tablet   5     Allergies Alendronate sodium; Aricept; and Warfarin sodium  Family History  Problem Relation Age of Onset  . Cancer    . Cancer Sister     breast cancer  . Cancer Brother     lung cancer    Social History History  Substance Use Topics  . Smoking status: Never Smoker   . Smokeless tobacco: Never Used  . Alcohol Use: No    Review of Systems Unable to obtain secondary to dementia  ____________________________________________   PHYSICAL EXAM:    101 F (38.3 C)   129   23   142/81 mmHg  96 %     Constitutional: Awake and alert. Eyes: Conjunctivae are normal. PERRL. Normal extraocular movements. ENT   Head: Normocephalic and atraumatic.   Nose: No congestion/rhinnorhea.   Mouth/Throat: Mucous membranes are moist.   Neck: No stridor. Hematological/Lymphatic/Immunilogical: No cervical lymphadenopathy. Cardiovascular: Normal rate, regular rhythm.  No murmurs, rubs, or gallops. Respiratory: Normal respiratory effort without tachypnea nor retractions. Breath sounds are clear and equal bilaterally. No wheezes/rales/rhonchi. Gastrointestinal: Soft. Question tenderness diffusely. Patient grimaced during exam. Genitourinary: Deferred Musculoskeletal: Normal range of motion in all extremities. No joint effusions.  No lower extremity tenderness nor edema. Neurologic:  Demented. Nonverbal.   ____________________________________________    LABS (pertinent  positives/negatives)  Labs Reviewed  COMPREHENSIVE METABOLIC PANEL - Abnormal; Notable for the following:    Glucose, Bld 146 (*)    BUN 34 (*)    Creatinine, Ser 1.59 (*)    AST 54 (*)    GFR calc non Af Amer 27 (*)    GFR calc Af Amer 32 (*)    All other components  within normal limits  CBC WITH DIFFERENTIAL/PLATELET - Abnormal; Notable for the following:    WBC 22.6 (*)    Platelets 75 (*)    Neutro Abs 19.6 (*)    Monocytes Absolute 1.9 (*)    All other components within normal limits  LACTIC ACID, PLASMA - Abnormal; Notable for the following:    Lactic Acid, Venous 3.9 (*)    All other components within normal limits  LACTIC ACID, PLASMA - Abnormal; Notable for the following:    Lactic Acid, Venous 4.7 (*)    All other components within normal limits  TROPONIN I - Abnormal; Notable for the following:    Troponin I 0.04 (*)    All other components within normal limits  URINALYSIS COMPLETEWITH MICROSCOPIC ([facility] ONLY) - Abnormal; Notable for the following:    Color, Urine AMBER (*)    APPearance CLOUDY (*)    Hgb urine dipstick 2+ (*)    Protein, ur >500 (*)    Leukocytes, UA 3+ (*)    Bacteria, UA MANY (*)    Squamous Epithelial / LPF 0-5 (*)    All other components within normal limits  LACTIC ACID, PLASMA - Abnormal; Notable for the following:    Lactic Acid, Venous 5.0 (*)    All other components within normal limits  CBC - Abnormal; Notable for the following:    WBC 17.1 (*)    Platelets 61 (*)    All other components within normal limits  CREATININE, SERUM - Abnormal; Notable for the following:    Creatinine, Ser 1.44 (*)    GFR calc non Af Amer 31 (*)    GFR calc Af Amer 36 (*)    All other components within normal limits  CULTURE, BLOOD (ROUTINE X 2)  CULTURE, BLOOD (ROUTINE X 2)  URINE CULTURE  LACTIC ACID, PLASMA     ____________________________________________   EKG  I, Phineas Semen, attending physician, personally viewed and interpreted  this EKG  EKG Time: 0929 Rate: 115 Rhythm: Afib with RVR Axis: normal Intervals: qtc 412 QRS: narrow ST changes: no st elevation    ____________________________________________    RADIOLOGY  CXR  IMPRESSION: Stable exam. No evidence acute cardiopulmonary disease.  ____________________________________________   PROCEDURES  Procedure(s) performed: None  Critical Care performed: Yes, see critical care note(s)   CRITICAL CARE Performed by: Phineas Semen   Total critical care time: 30  Critical care time was exclusive of separately billable procedures and treating other patients.  Critical care was necessary to treat or prevent imminent or life-threatening deterioration.  Critical care was time spent personally by me on the following activities: development of treatment plan with patient and/or surrogate as well as nursing, discussions with consultants, evaluation of patient's response to treatment, examination of patient, obtaining history from patient or surrogate, ordering and performing treatments and interventions, ordering and review of laboratory studies, ordering and review of radiographic studies, pulse oximetry and re-evaluation of patient's condition.  ____________________________________________   INITIAL IMPRESSION / ASSESSMENT AND PLAN / ED COURSE  Pertinent labs & imaging results that were available during my care of the patient were reviewed by me and considered in my medical decision making (see chart for details).  Patient presents from nursing facility because of decreased mental status. Initial vital signs concerning for sepsis given fever, tachycardia and tachypnea. Code sepsis was called. Patient was written for empiric antibiotics and fluid bolus. Blood work consistent with sepsis with leukocytosis and elevated lactic acid. Patient was admitted  to the hospital for sepsis. Potentially from urine given results of  urinalysis.  ____________________________________________   FINAL CLINICAL IMPRESSION(S) / ED DIAGNOSES  Final diagnoses:  Sepsis, due to unspecified organism  UTI (lower urinary tract infection)     Phineas Semen, MD 05/09/15 1622

## 2015-05-09 NOTE — Telephone Encounter (Signed)
Received fax stating that resident was sent to hospital after not responding to staff and was continuously shaking throughout her whole body.  Hospital has admitted resident.  Fax is in your In Box for response.

## 2015-05-09 NOTE — Progress Notes (Signed)
Patient continues to spike fevers. Elevated lactic acid. We'll discontinue Rocephin and start her on Invanz for broader coverage for her urinary tract infection. Follow up on the cultures

## 2015-05-09 NOTE — Telephone Encounter (Signed)
Noted. Thanks.

## 2015-05-09 NOTE — ED Notes (Signed)
Family at bedside. 

## 2015-05-09 NOTE — Progress Notes (Signed)
Pharmacy notified RN about critical lactic acid, 4.7. RN paged Dr. Nemiah Commander regarding lactic acid and temp. Of 101.2, MD to put in 1x order of tylenol suppository.

## 2015-05-09 NOTE — Progress Notes (Signed)
Dr. Nemiah Commander paged about new critical lactic acid 5.0. Acknowledged.

## 2015-05-09 NOTE — Progress Notes (Signed)
Pt. MRSA PCR positive, MRSA screening order set opened and contact precautions initiated.

## 2015-05-09 NOTE — ED Notes (Signed)
In and out cath obtained and sent to lab. Tolerated well  Pt cleaned up and new diaper applied.

## 2015-05-09 NOTE — Progress Notes (Signed)
Dr. Nemiah Commander notified of sustained HR 130s-140s, pt not due for IV metoprolol yet. MD acknowledged and will put in orders.

## 2015-05-09 NOTE — ED Notes (Signed)
Brought in [facility] from [facility] with altered mental status  And fever since this am

## 2015-05-09 NOTE — Progress Notes (Signed)
Pt. admitted to unit. Pt is sleeping, arousable to voice and pain. Family (son, Chrissie Noa) oriented to room, call bell, Ascom phones and staff. Bed in low position, TEDs and SCDs in place. Fall safety plan reviewed with family. Full assessment to Epic. Dr. Nemiah Commander paged regarding Code Status: order is Full Code but form says DNR. Dr. Nemiah Commander to change. Will continue to monitor.

## 2015-05-09 NOTE — H&P (Addendum)
[facility] [place] [facility]   PATIENT NAME: [name]    MR#:  [id number]  DATE OF BIRTH:  08-Dec-1921  DATE OF ADMISSION:  05/09/2015  PRIMARY CARE PHYSICIAN: [name], MD   REQUESTING/REFERRING PHYSICIAN: Dr. Derrill Kay  CHIEF COMPLAINT:   Chief Complaint  Patient presents with  . Fever    HISTORY OF PRESENT ILLNESS:  [name]  is a 81 y.o. female with a known history of Alzheimer's dementia, atrial fibrillation, hypothyroidism and hypertension [facility] secondary to altered mental status and fever this morning. Patient is completely altered, unable to provide any history at this time. Most of the history is obtained from old records and also son at bedside. Patient was here about a month ago for sepsis and urinary tract infection, had Escherichia coli in urine and was discharged on Keflex at the time. According to son patient did well 2 days after discharge and was seen to be normal at her baseline even yesterday as well. This morning he was called to notify the patient is being sent to the hospital due to change in her mental status and also having a temperature of 10 54F. Workup here revealed again infected urine and she is being admitted for sepsis again. Also labs indicative of acute renal failure. At baseline with her dementia, patient is wheelchair-bound, total care, able to recognize family and answer simple questions. But cannot maintain a meaningful conversation  PAST MEDICAL HISTORY:   Past Medical History  Diagnosis Date  . Anxiety   . HTN (hypertension)   . Atrial fibrillation   . Dementia   . Tremor   . Osteoporosis     prev intolerant of alendronate  . Myocardial infarction   . Anemia   . Hypertension   . Closed left hip fracture   . Arthritis   . DEMENTIA   . Pneumonia   . Hypothyroid   . Hypothyroidism   . Depression     PAST SURGICAL HISTORY:   Past Surgical History   Procedure Laterality Date  . Head surgery      for trigeminal neuralgia  . Total hip arthroplasty      right  . Cataract extraction    . Appendectomy    . Cataract extraction, bilateral    . Rotator cuff repair      left  . Closed reduction humerus fracture      left    SOCIAL HISTORY:   History  Substance Use Topics  . Smoking status: Never Smoker   . Smokeless tobacco: Never Used  . Alcohol Use: No    FAMILY HISTORY:   Family History  Problem Relation Age of Onset  . Cancer    . Cancer Sister     breast cancer  . Cancer Brother     lung cancer  . Heart failure Father     DRUG ALLERGIES:   Allergies  Allergen Reactions  . Alendronate Sodium Nausea Only  . Aricept [Donepezil Hydrochloride] Other (See Comments)    Reaction:  Unknown   . Warfarin Sodium Other (See Comments)    Held after fall on 12/2011.      REVIEW OF SYSTEMS:   Review of Systems  Unable to perform ROS: dementia    MEDICATIONS AT HOME:   Prior to Admission medications   Medication Sig Start Date End Date Taking? Authorizing Provider  acetaminophen (TYLENOL) 325 MG tablet Take 650 mg by mouth every  6 (six) hours as needed for mild pain or fever.   Yes Historical Provider, MD  acetaminophen (TYLENOL) 500 MG tablet Take 500 mg by mouth 3 (three) times daily.   Yes Historical Provider, MD  acetaminophen (TYLENOL) 500 MG tablet Take 1,000 mg by mouth 3 (three) times daily as needed for mild pain.   Yes Historical Provider, MD  amLODipine (NORVASC) 5 MG tablet Take 1 tablet (5 mg total) by mouth daily. 04/15/15  Yes Richard Renae Gloss, MD  buPROPion (WELLBUTRIN) 100 MG tablet Take 1 tablet (100 mg total) by mouth every morning. Patient taking differently: Take 100 mg by mouth every morning.  03/03/15  Yes Joaquim Nam, MD  calcium-vitamin D (OYSTER CALCIUM 500 + D) 500-200 MG-UNIT per tablet Take one every evening. **MUST HAVE PHYSICAL FOR FURTHER REFILLS.** Patient taking differently: Take 1  tablet by mouth every evening.  03/25/15  Yes Joaquim Nam, MD  cholecalciferol (VITAMIN D) 400 UNITS TABS tablet Take 1 tablet (400 Units total) by mouth 2 (two) times daily. 03/10/15  Yes Joaquim Nam, MD  digoxin (LANOXIN) 0.125 MG tablet Take 1 tablet (125 mcg total) by mouth every morning. Patient taking differently: Take 125 mcg by mouth every morning.  01/17/15  Yes Joaquim Nam, MD  docusate (COLACE) 50 MG/5ML liquid Take 10 mLs (100 mg total) by mouth daily. 01/21/15  Yes Joaquim Nam, MD  furosemide (LASIX) 20 MG tablet Take 20 mg by mouth daily.   Yes Historical Provider, MD  levothyroxine (SYNTHROID, LEVOTHROID) 50 MCG tablet Take 1 tablet (50 mcg total) by mouth daily. 6 days a week, not on Sundays 11/05/14  Yes Joaquim Nam, MD  metoprolol tartrate (LOPRESSOR) 25 MG tablet Take 25 mg by mouth 2 (two) times daily.   Yes Historical Provider, MD  Multiple Vitamins-Iron (MULTIVITAMINS WITH IRON) TABS tablet Take 1 tablet by mouth every morning.   Yes Historical Provider, MD  senna (SENOKOT) 8.6 MG TABS tablet Take 1 tablet (8.6 mg total) by mouth every evening. 04/26/15  Yes Joaquim Nam, MD  sertraline (ZOLOFT) 25 MG tablet Take 1 tablet (25 mg total) by mouth every morning. Patient taking differently: Take 25 mg by mouth every morning.  01/26/15  Yes Joaquim Nam, MD      VITAL SIGNS:  Blood pressure 167/109, pulse 121, temperature 101 F (38.3 C), temperature source Rectal, resp. rate 32, height 5\' 4"  (1.626 m), weight 62.596 kg (138 lb), SpO2 96 %.  PHYSICAL EXAMINATION:   Physical Exam  GENERAL:  81 y.o.-year-old patient lying in the bed, lying in bed.Marland Kitchen  EYES: Pupils equal, round, post surgical, reactive to light and accommodation. No scleral icterus. Extraocular muscles intact.  HEENT: Head atraumatic, normocephalic. Oropharynx and nasopharynx clear. Dry mucous membranes NECK:  Supple, no jugular venous distention. No thyroid enlargement, no tenderness.   LUNGS: Normal breath sounds bilaterally, no wheezing, rales,rhonchi or crepitation. No use of accessory muscles of respiration.  CARDIOVASCULAR: S1, S2 normal. 3/6 systolic murmur heard, no rubs or gallops  ABDOMEN: Soft, nontender, nondistended. Bowel sounds present. No organomegaly or mass.  EXTREMITIES: No cyanosis, or clubbing. Plus pedal edema both lower extremities, also right hand edema present. Left arm is normal. NEUROLOGIC: Cranial nerves II through XII are intact. Appears to be generalized weakness. Unable to do complete neuro exam due to patient's altered mental status at this time. Withdraws to pain. Not following any simple commands. Eyes opening to name. GCS of 5.  PSYCHIATRIC:  The patient is lethargic and not oriented.  SKIN: No obvious rash, lesion, or ulcer.   LABORATORY PANEL:   CBC  Recent Labs Lab 05/09/15 1001  WBC 22.6*  HGB 14.7  HCT 46.0  PLT 75*   ------------------------------------------------------------------------------------------------------------------  Chemistries   Recent Labs Lab 05/09/15 1001  NA 142  K 4.4  CL 103  CO2 27  GLUCOSE 146*  BUN 34*  CREATININE 1.59*  CALCIUM 9.6  AST 54*  ALT 19  ALKPHOS 78  BILITOT 1.0   ------------------------------------------------------------------------------------------------------------------  Cardiac Enzymes  Recent Labs Lab 05/09/15 1001  TROPONINI 0.04*   ------------------------------------------------------------------------------------------------------------------  RADIOLOGY:  Dg Chest Port 1 View  05/09/2015   CLINICAL DATA:  Altered mental status.  Febrile.  EXAM: PORTABLE CHEST - 1 VIEW  COMPARISON:  04/12/2015  FINDINGS: Chronic cardiomegaly. Aortic and hilar contours are stable and negative. Chronic interstitial coarsening. There is no edema, consolidation, effusion, or pneumothorax.  IMPRESSION: Stable exam.  No evidence acute cardiopulmonary disease.   Electronically  Signed   By: Marnee Spring M.D.   On: 05/09/2015 09:52    EKG:   Orders placed or performed during the hospital encounter of 04/12/15  . ED EKG  . ED EKG  . EKG 12-Lead  . EKG 12-Lead  . EKG    IMPRESSION AND PLAN:   [name]  is a 81 y.o. female with a known history of Alzheimer's dementia, atrial fibrillation, hypothyroidism and hypertension [facility] secondary to altered mental status and fever this morning.  # 1 sepsis-to acute cystitis, urinary tract infection -Recent admission about a month ago for Escherichia coli UTI, was discharged on Keflex. -Follow blood and urine cultures. Started on IV Rocephin at this time. - Gentle IV fluids and monitor. Blood pressure is stable at this time.  #2 atrial fibrillation with rapid ventricle response-elevated heart rate, we'll give 10 mg of IV push Cardizem here in the emergency room. -Increase her metoprolol to , three times a day. -Also put some metoprolol IV when necessary orders if she is unable to take oral medications. -Continue her digoxin as well -Patient with known history of diastolic dysfunction in the past, hold Lasix at this time.  #3 malignant hypertension-patient on Norvasc and metoprolol. Will put some IV hydralazine when necessary if unable to take by mouth medications.  #4 dementia with behavioral disturbances,-continue her home medications.  #5 acute renal failure-hold Lasix, hold nephrotoxins -likely secondary to ATN from sepsis -Gentle hydration at this time.  # 6 thrombocytopenia-likely secondary to sepsis. Continue to monitor. If further drop noted, Will stop heparin  #6 DVT prophylaxis-on subcutaneous heparin   All the records are reviewed and case discussed with ED provider. Management plans discussed with the patient, family and they are in agreement.  CODE STATUS: DO NOT RESUSCITATE  TOTAL TIME TAKING CARE OF THIS PATIENT: 50 minutes.     Enid Baas M.D on 05/09/2015 at 12:41 PM  Between 7am to 6pm - Pager - [phone]  After 6pm go to [email or web address] - password [email or web address] [facility]  [facility]  Office  [phone]  CC: Primary care physician; [name], MD

## 2015-05-10 LAB — CBC
HCT: 41.2 % (ref 35.0–47.0)
Hemoglobin: 13.6 g/dL (ref 12.0–16.0)
MCH: 30.7 pg (ref 26.0–34.0)
MCHC: 32.9 g/dL (ref 32.0–36.0)
MCV: 93.3 fL (ref 80.0–100.0)
Platelets: 61 10*3/uL — ABNORMAL LOW (ref 150–440)
RBC: 4.42 MIL/uL (ref 3.80–5.20)
RDW: 13.7 % (ref 11.5–14.5)
WBC: 19.1 10*3/uL — ABNORMAL HIGH (ref 3.6–11.0)

## 2015-05-10 LAB — BASIC METABOLIC PANEL
ANION GAP: 10 (ref 5–15)
BUN: 31 mg/dL — ABNORMAL HIGH (ref 6–20)
CALCIUM: 8.6 mg/dL — AB (ref 8.9–10.3)
CO2: 24 mmol/L (ref 22–32)
Chloride: 112 mmol/L — ABNORMAL HIGH (ref 101–111)
Creatinine, Ser: 1.21 mg/dL — ABNORMAL HIGH (ref 0.44–1.00)
GFR calc Af Amer: 44 mL/min — ABNORMAL LOW (ref >60)
GFR calc non Af Amer: 38 mL/min — ABNORMAL LOW (ref >60)
Glucose, Bld: 122 mg/dL — ABNORMAL HIGH (ref 65–99)
POTASSIUM: 3.6 mmol/L (ref 3.5–5.1)
SODIUM: 146 mmol/L — AB (ref 135–145)

## 2015-05-10 MED ORDER — METOPROLOL TARTRATE 1 MG/ML IV SOLN
5.0000 mg | Freq: Four times a day (QID) | INTRAVENOUS | Status: DC
  Administered 2015-05-10 – 2015-05-12 (×7): 5 mg via INTRAVENOUS
  Filled 2015-05-10 (×3): qty 5

## 2015-05-10 MED ORDER — PIPERACILLIN-TAZOBACTAM 3.375 G IVPB 30 MIN
3.3750 g | Freq: Three times a day (TID) | INTRAVENOUS | Status: DC
  Administered 2015-05-10 – 2015-05-11 (×4): 3.375 g via INTRAVENOUS
  Filled 2015-05-10 (×8): qty 50

## 2015-05-10 NOTE — Plan of Care (Signed)
Problem: Phase I Progression Outcomes Goal: Tolerating diet Outcome: Not Progressing Patient lethargic and not able to tolerate oral food at this moment.

## 2015-05-10 NOTE — Progress Notes (Signed)
Blood culture positive for gram negative rods for both aerobic and anaerobic bottle, DR. Willis notified and new order for ABT in  to be administered. Patient still  Lethargic and sleeping since change of shift.

## 2015-05-10 NOTE — Progress Notes (Signed)
Pt sleeping.  HR increased and Metoprolol given per dr orders.  Turned pt to right side.  Oral care performed. IV infusing,  Bed in low position.

## 2015-05-10 NOTE — Progress Notes (Signed)
[facility] [place] at [facility]   PATIENT NAME: [name]    MR#:  [id number]  DATE OF BIRTH:  [birth year]  SUBJECTIVE:  CHIEF COMPLAINT:   Chief Complaint  Patient presents with  . Fever  pt is drowsy and not able to give any history.  REVIEW OF SYSTEMS:  Unable to get due to mental status.  ROS  DRUG ALLERGIES:   Allergies  Allergen Reactions  . Alendronate Sodium Nausea Only  . Aricept [Donepezil Hydrochloride] Other (See Comments)    Reaction:  Unknown   . Warfarin Sodium Other (See Comments)    Held after fall on 12/2011.      VITALS:  Blood pressure 142/90, pulse 76, temperature 97.5 F (36.4 C), temperature source Oral, resp. rate 19, height 5\' 4"  (1.626 m), weight 63.957 kg (141 lb), SpO2 93 %.  PHYSICAL EXAMINATION:  GENERAL: 81 y.o.-year-old patient lying in the bed, lying in bed.Marland Kitchen  EYES: Pupils equal, round, post surgical, reactive to light and accommodation. No scleral icterus.   HEENT: Head atraumatic, normocephalic. Oropharynx and nasopharynx clear. Dry mucous membranes NECK: Supple, no jugular venous distention. No thyroid enlargement, no tenderness.  LUNGS: Normal breath sounds bilaterally, no wheezing, rales,rhonchi or crepitation. No use of accessory muscles of respiration.  CARDIOVASCULAR: S1, S2 normal. 3/6 systolic murmur heard, no rubs or gallops  ABDOMEN: Soft, nontender, nondistended. Bowel sounds present. No organomegaly or mass.  EXTREMITIES: No cyanosis, or clubbing. Plus pedal edema both lower extremities, also right hand edema present. Left arm is normal. NEUROLOGIC: Unable to do complete neuro exam due to patient's altered mental status at this time. Withdraws to pain. Not following any simple commands.   PSYCHIATRIC: The patient is lethargic and not oriented.  SKIN: No obvious rash, lesion, or ulcer.   Physical Exam LABORATORY PANEL:   CBC  Recent Labs Lab 05/10/15 0532  WBC 19.1*  HGB 13.6   HCT 41.2  PLT 61*   ------------------------------------------------------------------------------------------------------------------  Chemistries   Recent Labs Lab 05/09/15 1001  05/10/15 0532  NA 142  --  146*  K 4.4  --  3.6  CL 103  --  112*  CO2 27  --  24  GLUCOSE 146*  --  122*  BUN 34*  --  31*  CREATININE 1.59*  < > 1.21*  CALCIUM 9.6  --  8.6*  AST 54*  --   --   ALT 19  --   --   ALKPHOS 78  --   --   BILITOT 1.0  --   --   < > = values in this interval not displayed. ------------------------------------------------------------------------------------------------------------------  Cardiac Enzymes  Recent Labs Lab 05/09/15 1001  TROPONINI 0.04*   ------------------------------------------------------------------------------------------------------------------  RADIOLOGY:  Dg Chest Port 1 View  05/09/2015   CLINICAL DATA:  Altered mental status.  Febrile.  EXAM: PORTABLE CHEST - 1 VIEW  COMPARISON:  04/12/2015  FINDINGS: Chronic cardiomegaly. Aortic and hilar contours are stable and negative. Chronic interstitial coarsening. There is no edema, consolidation, effusion, or pneumothorax.  IMPRESSION: Stable exam.  No evidence acute cardiopulmonary disease.   Electronically Signed   By: Marnee Spring M.D.   On: 05/09/2015 09:52     ASSESSMENT AND PLAN:   # 1 sepsis-to acute cystitis, urinary tract infection and gram negative bacteremia -Recent admission about a month ago for Escherichia coli UTI, was discharged on Keflex. -positive blood culture,awaited urine cultures. Started on IV Rocephin, swiched to zosyn - Gentle IV  fluids and monitor. Blood pressure is stable at this time.  #2 atrial fibrillation with rapid ventricle response-elevated heart rate,  ngive 10 mg of IV push Cardizem here in the emergency room. -not able to take metoprolol to , three times a day, due to drowsiness. -swiched to metoprolol IV as unable to take oral  medications. -Continue her digoxin as well -Patient with known history of diastolic dysfunction in the past, hold Lasix at this time.  #3 malignant hypertension-patient on Norvasc and metoprolol. Will put some IV hydralazine when necessary if unable to take by mouth medications.  #4 dementia with behavioral disturbances,-continue her home medications.  #5 acute renal failure-hold Lasix, hold nephrotoxins -likely secondary to ATN from sepsis -Gentle hydration at this time.  # 6 thrombocytopenia-likely secondary to sepsis. Continue to monitor. If further drop noted, Will stop heparin  #7 DVT prophylaxis-on subcutaneous heparin   All the records are reviewed and case discussed with Care Management/Social Workerr. Management plans discussed with the patient, family and they are in agreement.  CODE STATUS: DNR  TOTAL TIME TAKING CARE OF THIS PATIENT: 35 minutes.   POSSIBLE D/C IN 1-2 DAYS, DEPENDING ON CLINICAL CONDITION.   Altamese Dilling M.D on 05/10/2015   Between 7am to 6pm - Pager - [phone]  After 6pm go to [email or web address] - password [email or web address] [facility]  [place] [facility]  Office  [phone]  CC: Primary care physician; [name], MD

## 2015-05-10 NOTE — Clinical Social Work Note (Signed)
CSW spoke to pt's  Family.  Per pt's family pt was a total care pt at [facility].  Current DC plans are to DC pt back to [facility] when she is medically stable.

## 2015-05-10 NOTE — Progress Notes (Signed)
Pt not verbally talking, turned pt on side.  IV infusing,  IV site looks good.  Will continue to monitor  The pt.    Son at bedside.

## 2015-05-10 NOTE — Progress Notes (Signed)
PT Cancellation Note  Patient Details Name: [name] MRN: [id number] DOB: 08-29-1922   Cancelled Treatment:    Reason Eval/Treat Not Completed: PT screened, no needs identified, will sign off. Chart reviewed and RN consulted. Upon arrival at room pt is sleeping soundly. Spoke with son who states that pt is dependent care at baseline for all mobility. Pt requires lift for all transfers and is unable to assist or ambulate. No needs identified. Order will be completed at this time. Please enter new order if needs/status changes.   Sharalyn Ink Celise Bazar PT, DPT   Louise Victory 05/10/2015, 9:27 AM

## 2015-05-11 DIAGNOSIS — I252 Old myocardial infarction: Secondary | ICD-10-CM

## 2015-05-11 DIAGNOSIS — A419 Sepsis, unspecified organism: Secondary | ICD-10-CM

## 2015-05-11 DIAGNOSIS — F0280 Dementia in other diseases classified elsewhere without behavioral disturbance: Secondary | ICD-10-CM

## 2015-05-11 DIAGNOSIS — M818 Other osteoporosis without current pathological fracture: Secondary | ICD-10-CM

## 2015-05-11 DIAGNOSIS — Z87310 Personal history of (healed) osteoporosis fracture: Secondary | ICD-10-CM

## 2015-05-11 DIAGNOSIS — I10 Essential (primary) hypertension: Secondary | ICD-10-CM

## 2015-05-11 DIAGNOSIS — M1990 Unspecified osteoarthritis, unspecified site: Secondary | ICD-10-CM

## 2015-05-11 DIAGNOSIS — Z9049 Acquired absence of other specified parts of digestive tract: Secondary | ICD-10-CM

## 2015-05-11 DIAGNOSIS — E039 Hypothyroidism, unspecified: Secondary | ICD-10-CM

## 2015-05-11 DIAGNOSIS — I2510 Atherosclerotic heart disease of native coronary artery without angina pectoris: Secondary | ICD-10-CM

## 2015-05-11 DIAGNOSIS — Z515 Encounter for palliative care: Secondary | ICD-10-CM

## 2015-05-11 DIAGNOSIS — G309 Alzheimer's disease, unspecified: Secondary | ICD-10-CM

## 2015-05-11 DIAGNOSIS — N390 Urinary tract infection, site not specified: Secondary | ICD-10-CM

## 2015-05-11 DIAGNOSIS — F329 Major depressive disorder, single episode, unspecified: Secondary | ICD-10-CM

## 2015-05-11 DIAGNOSIS — I4891 Unspecified atrial fibrillation: Secondary | ICD-10-CM

## 2015-05-11 DIAGNOSIS — B9620 Unspecified Escherichia coli [E. coli] as the cause of diseases classified elsewhere: Secondary | ICD-10-CM

## 2015-05-11 DIAGNOSIS — F419 Anxiety disorder, unspecified: Secondary | ICD-10-CM

## 2015-05-11 LAB — CBC
HCT: 43.2 % (ref 35.0–47.0)
Hemoglobin: 13.9 g/dL (ref 12.0–16.0)
MCH: 30.1 pg (ref 26.0–34.0)
MCHC: 32.2 g/dL (ref 32.0–36.0)
MCV: 93.2 fL (ref 80.0–100.0)
PLATELETS: 52 10*3/uL — AB (ref 150–440)
RBC: 4.63 MIL/uL (ref 3.80–5.20)
RDW: 14.2 % (ref 11.5–14.5)
WBC: 10 10*3/uL (ref 3.6–11.0)

## 2015-05-11 LAB — CULTURE, BLOOD (ROUTINE X 2)

## 2015-05-11 LAB — PLATELET COUNT: Platelets: 50 10*3/uL — ABNORMAL LOW (ref 150–440)

## 2015-05-11 MED ORDER — SODIUM CHLORIDE 0.9 % IV SOLN
1.0000 g | Freq: Two times a day (BID) | INTRAVENOUS | Status: DC
  Administered 2015-05-11: 1 g via INTRAVENOUS
  Filled 2015-05-11 (×3): qty 1

## 2015-05-11 MED ORDER — LEVOFLOXACIN IN D5W 750 MG/150ML IV SOLN
750.0000 mg | INTRAVENOUS | Status: AC
  Administered 2015-05-11: 750 mg via INTRAVENOUS
  Filled 2015-05-11: qty 150

## 2015-05-11 MED ORDER — LEVOFLOXACIN IN D5W 750 MG/150ML IV SOLN
750.0000 mg | INTRAVENOUS | Status: DC
  Filled 2015-05-11: qty 150

## 2015-05-11 MED ORDER — SODIUM CHLORIDE 0.45 % IV SOLN
INTRAVENOUS | Status: DC
  Administered 2015-05-11 – 2015-05-12 (×2): via INTRAVENOUS

## 2015-05-11 MED ORDER — MORPHINE SULFATE 2 MG/ML IJ SOLN
1.0000 mg | INTRAMUSCULAR | Status: DC | PRN
  Administered 2015-05-11 – 2015-05-12 (×3): 1 mg via INTRAVENOUS
  Filled 2015-05-11 (×3): qty 1

## 2015-05-11 MED ORDER — MEROPENEM 1 G IV SOLR: 1.0000 g | Freq: Three times a day (TID) | INTRAVENOUS | Status: DC

## 2015-05-11 NOTE — Care Management (Signed)
Consult for CM assessment.  Patient presents from [facility] and from reports- she was total care and nonambulatory.  She is admitted with sepsis.  She is a DNR.  At present, patient is very lethargic and minimally responsive.  There is discussion regarding patient requiring higher level of care but at present, CM and CSW have not identified a skilled need for higher level of care.  Palliative Care consulting.

## 2015-05-11 NOTE — Telephone Encounter (Signed)
Called her son.  He is trying to work through the situation.  She has declined.  He is considering her goals of care.  I offered my support and he thanked me.

## 2015-05-11 NOTE — Progress Notes (Signed)
ANTIBIOTIC CONSULT NOTE - INITIAL  Pharmacy Consult for Levofloxacin  Indication: bacteremia  Allergies  Allergen Reactions  . Alendronate Sodium Nausea Only  . Aricept [Donepezil Hydrochloride] Other (See Comments)    Reaction:  Unknown   . Warfarin Sodium Other (See Comments)    Held after fall on 12/2011.      Patient Measurements: Height: 5\' 4"  (162.6 cm) Weight: 138 lb (62.596 kg) IBW/kg (Calculated) : 54.7 Adjusted Body Weight: 57.8  Vital Signs: Temp: 99.1 F (37.3 C) (06/15 1140) Temp Source: Oral (06/15 1140) BP: 162/129 mmHg (06/15 1140) Pulse Rate: 127 (06/15 1140) Intake/Output from previous day:   Intake/Output from this shift:    Labs:  Recent Labs  05/09/15 1001 05/09/15 1420 05/10/15 0532 05/11/15 0403 05/11/15 1234  WBC 22.6* 17.1* 19.1*  --  10.0  HGB 14.7 13.7 13.6  --  13.9  PLT 75* 61* 61* 50* 52*  CREATININE 1.59* 1.44* 1.21*  --   --    Estimated Creatinine Clearance: 26.7 mL/min (by C-G formula based on Cr of 1.21).  Microbiology: Ucx pending Blood cx: GNR x 2   Medical History: Past Medical History  Diagnosis Date  . Anxiety   . HTN (hypertension)   . Atrial fibrillation   . Dementia   . Tremor   . Osteoporosis     prev intolerant of alendronate  . Myocardial infarction   . Anemia   . Hypertension   . Closed left hip fracture   . Arthritis   . DEMENTIA   . Pneumonia   . Hypothyroid   . Hypothyroidism   . Depression     Medications:  Scheduled:  . amLODipine  5 mg Oral Daily  . buPROPion  100 mg Oral BH-q7a  . calcium-vitamin D  1 tablet Oral QPM  . Chlorhexidine Gluconate Cloth  6 each Topical Q0600  . cholecalciferol  400 Units Oral BID  . digoxin  0.125 mg Intravenous Daily  . docusate  100 mg Oral Daily  . levofloxacin (LEVAQUIN) IV  750 mg Intravenous STAT  . [START ON 05/13/2015] levofloxacin (LEVAQUIN) IV  750 mg Intravenous Q48H  . levothyroxine  50 mcg Oral Daily  . metoprolol  5 mg Intravenous 4  times per day  . multivitamin with minerals  1 tablet Oral BH-q7a  . mupirocin ointment  1 application Nasal BID  . senna  1 tablet Oral QPM  . sertraline  25 mg Oral BH-q7a   Anti-infectives    Start     Dose/Rate Route Frequency Ordered Stop   05/13/15 1800  levofloxacin (LEVAQUIN) IVPB 750 mg     750 mg 100 mL/hr over 90 Minutes Intravenous Every 48 hours 05/11/15 1334     05/11/15 1345  levofloxacin (LEVAQUIN) IVPB 750 mg     750 mg 100 mL/hr over 90 Minutes Intravenous STAT 05/11/15 1334 05/12/15 1345   05/11/15 1000  meropenem (MERREM) 1 g in sodium chloride 0.9 % 100 mL IVPB  Status:  Discontinued     1 g 200 mL/hr over 30 Minutes Intravenous Every 12 hours 05/11/15 0826 05/11/15 1257   05/11/15 0800  meropenem (MERREM) 1 g in sodium chloride 0.9 % 100 mL IVPB  Status:  Discontinued     1 g 200 mL/hr over 30 Minutes Intravenous 3 times per day 05/11/15 0757 05/11/15 0826   05/10/15 0015  piperacillin-tazobactam (ZOSYN) IVPB 3.375 g  Status:  Discontinued     3.375 g 12.5 mL/hr over 240 Minutes  Intravenous 3 times per day 05/10/15 0011 05/11/15 0757   05/09/15 1530  ertapenem (INVANZ) 1 g in sodium chloride 0.9 % 50 mL IVPB  Status:  Discontinued     1 g 100 mL/hr over 30 Minutes Intravenous Every 24 hours 05/09/15 1522 05/10/15 0028   05/09/15 1245  cefTRIAXone (ROCEPHIN) 1 g in dextrose 5 % 50 mL IVPB - Premix  Status:  Discontinued     1 g 100 mL/hr over 30 Minutes Intravenous Every 24 hours 05/09/15 1240 05/09/15 1522   05/09/15 0945  piperacillin-tazobactam (ZOSYN) IVPB 3.375 g     3.375 g 100 mL/hr over 30 Minutes Intravenous  Once 05/09/15 0943 05/09/15 1109   05/09/15 0945  vancomycin (VANCOCIN) IVPB 1000 mg/200 mL premix     1,000 mg 200 mL/hr over 60 Minutes Intravenous  Once 05/09/15 0454 05/09/15 1130     Assessment: 81 yo female admitted with sepsis, acute cystitis, urinary tract infection and gram negative bacteremia -Recent admission about a month ago for  Escherichia coli UTI, was discharged on Keflex.  Patient previously on meropenem.   Goal of Therapy:  Resolution of infection  Plan:  Follow up culture results   Will give Vancomycin 750 mg IV x 1 then 750 mg IV q48 hours therafter.      Demetrius Charity, PharmD  Clinical Pharmacist 05/11/2015

## 2015-05-11 NOTE — Consult Note (Signed)
Palliative Medicine Inpatient Consult Note   Name: [name] Date: 05/11/2015 MRN: [id number]  DOB: 19-Dec-1921  Referring Physician: Altamese Dilling, MD  Palliative Care consult requested for this 81 y.o. female for goals of medical therapy in patient with dementia, admitted with UTI/sepsis  Ms Kolinski is a 81 yo woman with PMH of Alzheimer's dementia, HTN, CAD, a.fib, osteoporosis with h/o fragility fx's (L.hip, L.arm), OA, hypothyroidism, depression, s/p appendectomy. She was just hospitalized 03/2015 with E.coli UTI. She was readmitted 05/09/15 with pansensitive E.coli UTI/bactermia. Pt has not improved with abx and remains unresponsive. At present, pt is lying in bed sleeping. No response to verbal or tactile stimuli. Son at bedside.    REVIEW OF SYSTEMS:  Patient is not able to provide ROS    SOCIAL HISTORY: Pt is widowed. She has a son who is her HCPOA. She is a resident of [facility].  reports that she has never smoked. She has never used smokeless tobacco. She reports that she does not drink alcohol or use illicit drugs.  CODE STATUS: DNR  PAST MEDICAL HISTORY: Past Medical History  Diagnosis Date  . Anxiety   . HTN (hypertension)   . Atrial fibrillation   . Dementia   . Tremor   . Osteoporosis     prev intolerant of alendronate  . Myocardial infarction   . Anemia   . Hypertension   . Closed left hip fracture   . Arthritis   . DEMENTIA   . Pneumonia   . Hypothyroid   . Hypothyroidism   . Depression     PAST SURGICAL HISTORY:  Past Surgical History  Procedure Laterality Date  . Head surgery      for trigeminal neuralgia  . Total hip arthroplasty      right  . Cataract extraction    . Appendectomy    . Cataract extraction, bilateral    . Rotator cuff repair      left  . Closed reduction humerus fracture      left    ALLERGIES:  is allergic to alendronate sodium; aricept; and warfarin sodium.  MEDICATIONS:  Current  Facility-Administered Medications  Medication Dose Route Frequency Provider Last Rate Last Dose  . 0.9 %  sodium chloride infusion   Intravenous Continuous Enid Baas, MD 100 mL/hr at 05/11/15 0731    . acetaminophen (TYLENOL) tablet 650 mg  650 mg Oral Q6H PRN Enid Baas, MD       Or  . acetaminophen (TYLENOL) suppository 650 mg  650 mg Rectal Q6H PRN Enid Baas, MD      . albuterol (PROVENTIL) (2.5 MG/3ML) 0.083% nebulizer solution 2.5 mg  2.5 mg Nebulization Q2H PRN Enid Baas, MD      . amLODipine (NORVASC) tablet 5 mg  5 mg Oral Daily Enid Baas, MD   5 mg at 05/09/15 1412  . buPROPion [facility]) tablet 100 mg  100 mg Oral Garen Lah, MD   100 mg at 05/10/15 1130  . calcium-vitamin D (OSCAL WITH D) 500-200 MG-UNIT per tablet 1 tablet  1 tablet Oral QPM Enid Baas, MD   1 tablet at 05/09/15 1800  . Chlorhexidine Gluconate Cloth 2 % PADS 6 each  6 each Topical Q0600 Joycelyn Man, RN   6 each at 05/11/15 0600  . cholecalciferol (VITAMIN D) tablet 400 Units  400 Units Oral BID Enid Baas, MD   400 Units at 05/09/15 2223  . digoxin (LANOXIN) 0.25 MG/ML injection 0.125 mg  0.125 mg Intravenous Daily Gladstone Lighter, MD   0.125 mg at 05/11/15 0853  . docusate (COLACE) 50 MG/5ML liquid 100 mg  100 mg Oral Daily Gladstone Lighter, MD   100 mg at 05/09/15 1412  . docusate sodium (COLACE) capsule 100 mg  100 mg Oral BID PRN Gladstone Lighter, MD      . hydrALAZINE (APRESOLINE) injection 10 mg  10 mg Intravenous Q6H PRN Gladstone Lighter, MD   10 mg at 05/09/15 1318  . levothyroxine (SYNTHROID, LEVOTHROID) tablet 50 mcg  50 mcg Oral Daily Gladstone Lighter, MD   50 mcg at 05/09/15 1407  . meropenem (MERREM) 1 g in sodium chloride 0.9 % 100 mL IVPB  1 g Intravenous Q12H Vaughan Basta, MD   1 g at 05/11/15 1007  . metoprolol (LOPRESSOR) injection 5 mg  5 mg Intravenous Q4H PRN Gladstone Lighter, MD   5 mg at 05/09/15 1318  .  metoprolol (LOPRESSOR) injection 5 mg  5 mg Intravenous 4 times per day Vaughan Basta, MD   5 mg at 05/11/15 1215  . multivitamin with minerals tablet 1 tablet  1 tablet Oral Maury Dus, MD   1 tablet at 05/10/15 1133  . mupirocin ointment (BACTROBAN) 2 % 1 application  1 application Nasal BID Alonna Buckler, RN   1 application at 82/70/78 [id number]  . senna (SENOKOT) tablet 8.6 mg  1 tablet Oral QPM Gladstone Lighter, MD   8.6 mg at 05/09/15 1800  . senna (SENOKOT) tablet 8.6 mg  1 tablet Oral Daily PRN Gladstone Lighter, MD      . sertraline (ZOLOFT) tablet 25 mg  25 mg Oral Maury Dus, MD   25 mg at 05/10/15 1134    Vital Signs: BP 162/129 mmHg  Pulse 127  Temp(Src) 99.1 F (37.3 C) (Oral)  Resp 18  Ht $R'5\' 4"'NV$  (1.626 m)  Wt 62.596 kg (138 lb)  BMI 23.68 kg/m2  SpO2 91% Filed Weights   05/09/15 1407 05/10/15 0423 05/11/15 0448  Weight: 60.374 kg (133 lb 1.6 oz) 63.957 kg (141 lb) 62.596 kg (138 lb)    Estimated body mass index is 23.68 kg/(m^2) as calculated from the following:   Height as of this encounter: $RemoveBeforeD'5\' 4"'[id number]$  (1.626 m).   Weight as of this encounter: 62.596 kg (138 lb).    PHYSICAL EXAM:  General: ill-appearing HEENT: OP clear, dry oral mucosa Neck: Trachea midline  Cardiovascular: irregular rate and rhythm, tachycardic Pulmonary/Chest: Clear ant fields Abdominal: Soft, NTTP, hypoactive bowel sounds GU: No SP tenderness Extremities: No edema  Neurological: no response to verbal or tactile stimuli Skin: no rashes Psychiatric: unable to assess   LABS: CBC:  Recent Labs Lab 05/09/15 1420 05/10/15 0532 05/11/15 0403  WBC 17.1* 19.1*  --   HGB 13.7 13.6  --   HCT 42.3 41.2  --   PLT 61* 61* 50*   Comprehensive Metabolic Panel:  Recent Labs Lab 05/09/15 1001 05/09/15 1420 05/10/15 0532  NA 142  --  146*  K 4.4  --  3.6  CL 103  --  112*  CO2 27  --  24  GLUCOSE 146*  --  122*  BUN 34*  --  31*  CREATININE 1.59* 1.44*  1.21*  CALCIUM 9.6  --  8.6*  AST 54*  --   --   ALT 19  --   --   ALKPHOS 78  --   --   BILITOT 1.0  --   --  IMPRESSION:  Ms Wilbanks is a 81 yo woman with PMH of Alzheimer's dementia, HTN, CAD, a.fib, osteoporosis with h/o fragility fx's (L.hip, L.arm), OA, hypothyroidism, depression, s/p appendectomy. She was just hospitalized 03/2015 with E.coli UTI. She was readmitted 05/09/15 with pansensitive E.coli UTI/bactermia. Pt has not improved with abx and remains unresponsive.   I met with pt's son. He confirms that pt's mental status has not improved with tx. I explained that pt may be approaching the end of life and son accepts this. We discussed that options of continued aggressive care vs comfort tx. Son is emotional and says that he cannot make a decision at this time. He would like to see how pt does over the next 24hrs and then may consider comfort tx if still no improvement. I will follow up in AM.   I discussed the use of morphine for comfort for pt and son in agreement. Order entered.   PLAN: 1. Continue current care 2. Follow up in AM 3. Morphine IV prn dypnea  REFERRALS TO BE ORDERED:  Chaplain   More than 50% of the visit was spent in counseling/coordination of care: YES  Time spent: 75 minutes

## 2015-05-11 NOTE — Progress Notes (Signed)
ANTIBIOTIC CONSULT NOTE - INITIAL  Pharmacy Consult for Meropenem Indication: bacteremia  Allergies  Allergen Reactions  . Alendronate Sodium Nausea Only  . Aricept [Donepezil Hydrochloride] Other (See Comments)    Reaction:  Unknown   . Warfarin Sodium Other (See Comments)    Held after fall on 12/2011.      Patient Measurements: Height: 5\' 4"  (162.6 cm) Weight: 138 lb (62.596 kg) IBW/kg (Calculated) : 54.7 Adjusted Body Weight: 57.8  Vital Signs: Temp: 98.5 F (36.9 C) (06/15 0448) Temp Source: Axillary (06/15 0448) BP: 168/96 mmHg (06/15 0603) Pulse Rate: 86 (06/15 0603) Intake/Output from previous day:   Intake/Output from this shift:    Labs:  Recent Labs  05/09/15 1001 05/09/15 1420 05/10/15 0532 05/11/15 0403  WBC 22.6* 17.1* 19.1*  --   HGB 14.7 13.7 13.6  --   PLT 75* 61* 61* 50*  CREATININE 1.59* 1.44* 1.21*  --    Estimated Creatinine Clearance: 26.7 mL/min (by C-G formula based on Cr of 1.21).  Microbiology: Ucx pending Blood cx: GNR x 2   Medical History: Past Medical History  Diagnosis Date  . Anxiety   . HTN (hypertension)   . Atrial fibrillation   . Dementia   . Tremor   . Osteoporosis     prev intolerant of alendronate  . Myocardial infarction   . Anemia   . Hypertension   . Closed left hip fracture   . Arthritis   . DEMENTIA   . Pneumonia   . Hypothyroid   . Hypothyroidism   . Depression     Medications:  Scheduled:  . amLODipine  5 mg Oral Daily  . buPROPion  100 mg Oral BH-q7a  . calcium-vitamin D  1 tablet Oral QPM  . Chlorhexidine Gluconate Cloth  6 each Topical Q0600  . cholecalciferol  400 Units Oral BID  . digoxin  0.125 mg Intravenous Daily  . docusate  100 mg Oral Daily  . levothyroxine  50 mcg Oral Daily  . meropenem (MERREM) IV  1 g Intravenous Q12H  . metoprolol  5 mg Intravenous 4 times per day  . multivitamin with minerals  1 tablet Oral BH-q7a  . mupirocin ointment  1 application Nasal BID  . senna   1 tablet Oral QPM  . sertraline  25 mg Oral BH-q7a   Anti-infectives    Start     Dose/Rate Route Frequency Ordered Stop   05/11/15 1000  meropenem (MERREM) 1 g in sodium chloride 0.9 % 100 mL IVPB     1 g 200 mL/hr over 30 Minutes Intravenous Every 12 hours 05/11/15 0826     05/11/15 0800  meropenem (MERREM) 1 g in sodium chloride 0.9 % 100 mL IVPB  Status:  Discontinued     1 g 200 mL/hr over 30 Minutes Intravenous 3 times per day 05/11/15 0757 05/11/15 0826   05/10/15 0015  piperacillin-tazobactam (ZOSYN) IVPB 3.375 g  Status:  Discontinued     3.375 g 12.5 mL/hr over 240 Minutes Intravenous 3 times per day 05/10/15 0011 05/11/15 0757   05/09/15 1530  ertapenem (INVANZ) 1 g in sodium chloride 0.9 % 50 mL IVPB  Status:  Discontinued     1 g 100 mL/hr over 30 Minutes Intravenous Every 24 hours 05/09/15 1522 05/10/15 0028   05/09/15 1245  cefTRIAXone (ROCEPHIN) 1 g in dextrose 5 % 50 mL IVPB - Premix  Status:  Discontinued     1 g 100 mL/hr over 30 Minutes  Intravenous Every 24 hours 05/09/15 1240 05/09/15 1522   05/09/15 0945  piperacillin-tazobactam (ZOSYN) IVPB 3.375 g     3.375 g 100 mL/hr over 30 Minutes Intravenous  Once 05/09/15 0943 05/09/15 1109   05/09/15 0945  vancomycin (VANCOCIN) IVPB 1000 mg/200 mL premix     1,000 mg 200 mL/hr over 60 Minutes Intravenous  Once 05/09/15 7846 05/09/15 1130     Assessment: 81 yo female admitted with sepsis, acute cystitis, urinary tract infection and gram negative bacteremia -Recent admission about a month ago for Escherichia coli UTI, was discharged on Keflex. -positive blood culture,awaiting urine cultures Patient was on Zosyn, now transitioning to Meropenem.  Goal of Therapy:  Resolution of infection  Plan:  Follow up culture results  Meropenem 1 gram IV q8h ordered. Will transition to Meropenem 1 gram IV q12h per renal fxn (Scr 1.21  crcl 26.7 ml/min).   Bari Mantis PharmD Clinical Pharmacist 05/11/2015

## 2015-05-11 NOTE — Progress Notes (Signed)
[facility] - [place] at [facility]   PATIENT NAME: [name]    MR#:  [id number]  DATE OF BIRTH:  [birth year]  SUBJECTIVE:  CHIEF COMPLAINT:   Chief Complaint  Patient presents with  . Fever   pt is drowsy and not able to give any history.  REVIEW OF SYSTEMS:  Unable to get due to mental status.  ROS  DRUG ALLERGIES:   Allergies  Allergen Reactions  . Alendronate Sodium Nausea Only  . Aricept [Donepezil Hydrochloride] Other (See Comments)    Reaction:  Unknown   . Warfarin Sodium Other (See Comments)    Held after fall on 12/2011.      VITALS:  Blood pressure 162/129, pulse 127, temperature 99.1 F (37.3 C), temperature source Oral, resp. rate 18, height 5\' 4"  (1.626 m), weight 62.596 kg (138 lb), SpO2 91 %.  PHYSICAL EXAMINATION:  GENERAL: 81 y.o.-year-old patient lying in the bed, lying in bed.Marland Kitchen  EYES: Pupils equal, round, post surgical, reactive to light and accommodation. No scleral icterus.   HEENT: Head atraumatic, normocephalic. Oropharynx and nasopharynx clear. Dry mucous membranes, mouth breathing. NECK: Supple, no jugular venous distention. No thyroid enlargement, no tenderness.  LUNGS: Normal breath sounds bilaterally, no wheezing, rales,rhonchi or crepitation. No use of accessory muscles of respiration.  CARDIOVASCULAR: S1, S2 normal. 3/6 systolic murmur heard, no rubs or gallops  ABDOMEN: Soft, nontender, nondistended. Bowel sounds present. No organomegaly or mass.  EXTREMITIES: No cyanosis, or clubbing. Plus pedal edema both lower extremities, also right hand edema present. Left arm is normal. NEUROLOGIC: Unable to do complete neuro exam due to patient's altered mental status at this time. Withdraws to pain. Not following any simple commands.   PSYCHIATRIC: The patient is lethargic and not oriented.  SKIN: No obvious rash, lesion, or ulcer.   Physical Exam  LABORATORY PANEL:   CBC  Recent Labs Lab 05/11/15 1234   WBC 10.0  HGB 13.9  HCT 43.2  PLT 52*   ------------------------------------------------------------------------------------------------------------------  Chemistries   Recent Labs Lab 05/09/15 1001  05/10/15 0532  NA 142  --  146*  K 4.4  --  3.6  CL 103  --  112*  CO2 27  --  24  GLUCOSE 146*  --  122*  BUN 34*  --  31*  CREATININE 1.59*  < > 1.21*  CALCIUM 9.6  --  8.6*  AST 54*  --   --   ALT 19  --   --   ALKPHOS 78  --   --   BILITOT 1.0  --   --   < > = values in this interval not displayed. ------------------------------------------------------------------------------------------------------------------  Cardiac Enzymes  Recent Labs Lab 05/09/15 1001  TROPONINI 0.04*   ------------------------------------------------------------------------------------------------------------------  RADIOLOGY:  No results found.   ASSESSMENT AND PLAN:   # 1 sepsis-to acute cystitis, urinary tract infection and gram negative bacteremia -Recent admission about a month ago for Escherichia coli UTI, was discharged on Keflex. -positive blood culture- sensitive E coli ,negatvie urine cultures. Started on IV Rocephin, swiched to zosyn   Now switched to levaquin. - Gentle IV fluids and monitor. Blood pressure is stable at this time. - overall prognosis very poor.  #2 atrial fibrillation with rapid ventricle response-elevated heart rate,   give 10 mg of IV push Cardizem here in the emergency room. -not able to take metoprolol to 25mg , three times a day, due to drowsiness. -swiched to metoprolol IV as unable to take oral  medications. -Changed Digoxin IV as well. -Patient with known history of diastolic dysfunction in the past, hold Lasix at this time.  #3 malignant hypertension-patient on Norvasc and metoprolol. Will put some IV hydralazine when necessary if unable to take by mouth medications.  #4 dementia with behavioral disturbances,-continue her home medications.  #5  acute renal failure-hold Lasix, hold nephrotoxins -likely secondary to ATN from sepsis -Gentle hydration at this time.  # 6 thrombocytopenia-likely secondary to sepsis. Continue to monitor. If further drop noted, Will stop heparin  #7 DVT prophylaxis-on subcutaneous heparin  Called palliative care to help- pt is towards end of her life.  All the records are reviewed and case discussed with Care Management/Social Workerr. Management plans discussed with the  family and they are in agreement.  CODE STATUS: DNR  TOTAL TIME TAKING CARE OF THIS PATIENT: 35 minutes- more than 50 % time spent in co-ordination of care.   POSSIBLE D/C IN 1-2 DAYS, DEPENDING ON CLINICAL CONDITION.   Altamese Dilling M.D on 05/11/2015   Between 7am to 6pm - Pager - [phone]  After 6pm go to [email or web address] - password [email or web address] [facility]  [facility] [facility]  Office  [phone]  CC: Primary care physician; [name], MD

## 2015-05-12 ENCOUNTER — Telehealth: Payer: Self-pay | Admitting: *Deleted

## 2015-05-12 LAB — CBC
HEMATOCRIT: 44 % (ref 35.0–47.0)
HEMOGLOBIN: 14.4 g/dL (ref 12.0–16.0)
MCH: 30.5 pg (ref 26.0–34.0)
MCHC: 32.8 g/dL (ref 32.0–36.0)
MCV: 92.9 fL (ref 80.0–100.0)
Platelets: 44 10*3/uL — ABNORMAL LOW (ref 150–440)
RBC: 4.74 MIL/uL (ref 3.80–5.20)
RDW: 14 % (ref 11.5–14.5)
WBC: 9.9 10*3/uL (ref 3.6–11.0)

## 2015-05-12 LAB — BASIC METABOLIC PANEL
Anion gap: 9 (ref 5–15)
BUN: 31 mg/dL — AB (ref 6–20)
CHLORIDE: 113 mmol/L — AB (ref 101–111)
CO2: 23 mmol/L (ref 22–32)
CREATININE: 1.02 mg/dL — AB (ref 0.44–1.00)
Calcium: 8.3 mg/dL — ABNORMAL LOW (ref 8.9–10.3)
GFR calc non Af Amer: 47 mL/min — ABNORMAL LOW (ref >60)
GFR, EST AFRICAN AMERICAN: 54 mL/min — AB (ref >60)
Glucose, Bld: 112 mg/dL — ABNORMAL HIGH (ref 65–99)
Potassium: 2.9 mmol/L — CL (ref 3.5–5.1)
Sodium: 145 mmol/L (ref 135–145)

## 2015-05-12 LAB — CULTURE, BLOOD (ROUTINE X 2)

## 2015-05-12 LAB — MAGNESIUM: MAGNESIUM: 2 mg/dL (ref 1.7–2.4)

## 2015-05-12 MED ORDER — LORAZEPAM 1 MG PO TABS: 1.0000 mg | ORAL_TABLET | ORAL | Status: AC | PRN

## 2015-05-12 MED ORDER — DEXTROSE 5 % IV SOLN
40.0000 meq | Freq: Once | INTRAVENOUS | Status: AC
  Administered 2015-05-12: 40 meq via INTRAVENOUS
  Filled 2015-05-12: qty 9.09

## 2015-05-12 MED ORDER — POTASSIUM CHLORIDE IN NACL 40-0.9 MEQ/L-% IV SOLN
INTRAVENOUS | Status: DC
  Administered 2015-05-12: 75 mL/h via INTRAVENOUS
  Filled 2015-05-12 (×2): qty 1000

## 2015-05-12 MED ORDER — MORPHINE SULFATE (CONCENTRATE) 10 MG/0.5ML PO SOLN: 10.0000 mg | ORAL | Status: DC | PRN

## 2015-05-12 MED ORDER — MORPHINE SULFATE (CONCENTRATE) 10 MG /0.5 ML PO SOLN: 10.0000 mg | ORAL | Status: AC | PRN

## 2015-05-12 NOTE — Care Management (Signed)
Patient is for transfer to the Hospice Home

## 2015-05-12 NOTE — Consult Note (Addendum)
Hospice Home Discharge Orders   Patient:  [name] is an 81 y.o., female MRN:  [id number] DOB:  May 06, 1922 Patient phone:  [phone] (home)  Patient address:   [address] [place] [address],     Admit: Admit to Hospice Home  Code Status: DNR  Diet: as tolerated  Activity: as tolerated  Oxygen: use for comfort  Foley: Leave foley cath for urinary incontinence/previous skin breakdown   Date of Admission:  05/09/2015   Principal Problem: <principal problem not specified> Discharge Diagnoses: Patient Active Problem List   Diagnosis Date Noted  . Acute encephalopathy [G93.40] 04/12/2015  . Sepsis [A41.9] 04/12/2015  . Knee pain [M25.569] 04/26/2014  . Skin lesion [L98.9] 01/31/2014  . Thrombocytopenia, unspecified [D69.6] 12/31/2013  . DNR (do not resuscitate) [Z66] 06/18/2013  . Advanced directives, counseling/discussion [Z71.89] 12/18/2012  . Orthostatic hypotension [I95.1] 08/25/2012  . Thrombocytopenia [D69.6] 05/20/2012  . Hip fracture, intertrochanteric [S72.143A] 05/17/2012  . Osteoporosis [M81.0] 05/17/2012  . Hypothyroidism [E03.9] 05/17/2012  . Rhabdomyolysis [M62.82] 01/01/2012  . Syncope and collapse [R55] 12/31/2011  . UTI (lower urinary tract infection) [N39.0] 12/31/2011  . HTN (hypertension) [I10] 12/31/2011  . Dementia [F03.90] 08/26/2011  . Tremor [R25.1] 08/26/2011  . Anemia [D64.9] 08/26/2011  . History of pneumonia [Z87.01] 08/26/2011  . Spinal stenosis [M48.00] 08/26/2011  . Hip fracture, left [S72.002A] 08/26/2011  . Long term (current) use of anticoagulants [Z79.01] 07/17/2011  . Atrial fibrillation [I48.91] 07/13/2011      Medications: May crush or use liquid when appropriate. May change to rectal route if unable to swallow.    Medication List    STOP taking these medications        acetaminophen 325 MG tablet  Commonly known as:  TYLENOL     acetaminophen 500 MG tablet  Commonly known as:  TYLENOL     amLODipine 5 MG tablet  Commonly known as:  NORVASC     buPROPion 100 MG tablet  Commonly known as:  WELLBUTRIN     calcium-vitamin D 500-200 MG-UNIT per tablet  Commonly known as:  OYSTER CALCIUM 500 + D     cholecalciferol 400 UNITS Tabs tablet  Commonly known as:  VITAMIN D     digoxin 0.125 MG tablet  Commonly known as:  LANOXIN     docusate 50 MG/5ML liquid  Commonly known as:  COLACE     furosemide 20 MG tablet  Commonly known as:  LASIX     levothyroxine 50 MCG tablet  Commonly known as:  SYNTHROID, LEVOTHROID     metoprolol tartrate 25 MG tablet  Commonly known as:  LOPRESSOR     multivitamins with iron Tabs tablet     senna 8.6 MG Tabs tablet  Commonly known as:  SENOKOT     sertraline 25 MG tablet  Commonly known as:  ZOLOFT      TAKE these medications        LORazepam 1 MG tablet  Commonly known as:  ATIVAN  Take 1 tablet (1 mg total) by mouth every hour as needed for anxiety.     morphine CONCENTRATE 10 mg / 0.5 ml concentrated solution  Take 0.5 mLs (10 mg total) by mouth every hour as needed for moderate pain, severe pain, anxiety or shortness of breath.           Comments:    SignedAltamese Dilling 05/12/2015, 9:48 AM

## 2015-05-12 NOTE — Progress Notes (Signed)
   05/12/15 0920  Clinical Encounter Type  Visited With Patient and family together  Visit Type Initial  Referral From Physician  Consult/Referral To Chaplain  Spiritual Encounters  Spiritual Needs Prayer;Emotional;Grief support  Stress Factors  Patient Stress Factors Exhausted;Major life changes;Loss of control  Family Stress Factors Exhausted;Family relationships;Loss;Major life changes  Visited with son of patient. Provided grief and pastoral support. Son appeared at peace with decisions. Chap. Delice Bison, ext. [phone]

## 2015-05-12 NOTE — Clinical Documentation Improvement (Signed)
Presents with [facility] Septicemia with ARF with ATN.   Palliative care notes patient is unresponsive  Please attempt to further clarify with a diagnosis what "unresponsive" means:   Coma  Comatose  Persistent vegetative state  Other Condition  Unable to Determine  Please document findings in next progress note and include in discharge summary if applicable.  Thank You, Shellee Milo ,RN Clinical Documentation Specialist:  [phone]  [facility]- Health Information Management

## 2015-05-12 NOTE — Progress Notes (Signed)
[facility] referral receive following and Palliative Care Consult. [name] is a 81 year old woman with PMH of Alzheimer's dementia, HTN, CAD, a.fib, osteoporosis with h/o fragility fx's (L.hip, L.arm), OA, hypothyroidism, depression, s/p appendectomy. She was just hospitalized 03/2015 with E.coli UTI. She was readmitted 05/09/15 with pansensitive E.coli UTI/bactermia. Pt has not improved with antibiotic therapy and remains unresponsive. Prior to this admission [name] was a resident of [facility].  Patient seen lying in bed, eyes closed, no response to verbal or tactile stimuli, mouth breathing, appears comfortable. Per patient's son [name] and chart review patient had just received 1 mg of IV morphine for dyspnea. Writer met with [name] in the family room at his request. Education initiated regarding hospice home services, philosophy of and team approach to care with good understanding voiced. [name]'s main concern is that his mother "does not struggle and is kept comfortable" Consents signed. Patient information and consents faxed to referral intake, report called to [facility], EMS notified, RN, CSW, CM and MD all aware of plan for discharge to [facility] with portable DNR in place. Thank you for the opportunity to serve [name] and her family. Flo Shanks RN, BSN, [place] and [facility] [place], [facility] [phone] c

## 2015-05-12 NOTE — Discharge Summary (Signed)
[facility] - [place] at [facility]   PATIENT NAME: Sebrina Kessner    MR#:  [id number]  DATE OF BIRTH:  August 11, 1922  DATE OF ADMISSION:  05/09/2015 ADMITTING PHYSICIAN: Enid Baas, MD  DATE OF DISCHARGE: 05/12/2015  PRIMARY CARE PHYSICIAN: [name], MD    ADMISSION DIAGNOSIS:  UTI (lower urinary tract infection) [N39.0] Sepsis, due to unspecified organism [A41.9]  DISCHARGE DIAGNOSIS:  Active Problems:   Sepsis   UTI   Bacteremia- E coli.  SECONDARY DIAGNOSIS:   Past Medical History  Diagnosis Date  . Anxiety   . HTN (hypertension)   . Atrial fibrillation   . Dementia   . Tremor   . Osteoporosis     prev intolerant of alendronate  . Myocardial infarction   . Anemia   . Hypertension   . Closed left hip fracture   . Arthritis   . DEMENTIA   . Pneumonia   . Hypothyroid   . Hypothyroidism   . Depression     HOSPITAL COURSE:  # 1 sepsis-to acute cystitis, urinary tract infection and gram negative bacteremia -Recent admission about a month ago for Escherichia coli UTI, was discharged on Keflex. -positive blood culture- sensitive E coli ,negatvie urine cultures. Started on IV Rocephin, swiched to zosyn  Now switched to levaquin. - Gentle IV fluids and monitor. Blood pressure is stable at this time. - overall prognosis very poor. After meeting with palliative care- sone agreed on comfort care at hospice home.  #2 atrial fibrillation with rapid ventricle response-elevated heart rate,  give 10 mg of IV push Cardizem here in the emergency room. -not able to take metoprolol to , three times a day, due to drowsiness. -swiched to metoprolol IV as unable to take oral medications. -Changed Digoxin IV as well. - now son agreed for hospice care.  #3 malignant hypertension-patient on Norvasc and metoprolol. Will put some IV hydralazine when necessary if unable to take by mouth medications.  #4 dementia with behavioral  disturbances,-continued her home medications. Now discharge to hospice home.  #5 acute renal failure-hold Lasix, hold nephrotoxins -likely secondary to ATN from sepsis -Given Gentle hydration.  # 6 thrombocytopenia-likely secondary to sepsis. Continue to monitor. further drop noted, stopped heparin  #7 DVT prophylaxis-SCD  With help of palliative care- discharge to hospice today. DISCHARGE CONDITIONS:   Stable.  CONSULTS OBTAINED:   Dr.Phifer- Palliative care.  DRUG ALLERGIES:   Allergies  Allergen Reactions  . Alendronate Sodium Nausea Only  . Aricept [Donepezil Hydrochloride] Other (See Comments)    Reaction:  Unknown   . Warfarin Sodium Other (See Comments)    Held after fall on 12/2011.      DISCHARGE MEDICATIONS:   Current Discharge Medication List    START taking these medications   Details  LORazepam (ATIVAN) 1 MG tablet Take 1 tablet (1 mg total) by mouth every hour as needed for anxiety. Qty: 30 tablet, Refills: 0    Morphine Sulfate (MORPHINE CONCENTRATE) 10 mg / 0.5 ml concentrated solution Take 0.5 mLs (10 mg total) by mouth every hour as needed for moderate pain, severe pain, anxiety or shortness of breath. Qty: 10 mL, Refills: 0      STOP taking these medications     acetaminophen (TYLENOL) 325 MG tablet      acetaminophen (TYLENOL) 500 MG tablet      acetaminophen (TYLENOL) 500 MG tablet      amLODipine (NORVASC) 5 MG tablet      buPROPion (  WELLBUTRIN) 100 MG tablet      calcium-vitamin D (OYSTER CALCIUM 500 + D) 500-200 MG-UNIT per tablet      cholecalciferol (VITAMIN D) 400 UNITS TABS tablet      digoxin (LANOXIN) 0.125 MG tablet      docusate (COLACE) 50 MG/5ML liquid      furosemide (LASIX) 20 MG tablet      levothyroxine (SYNTHROID, LEVOTHROID) 50 MCG tablet      metoprolol tartrate (LOPRESSOR) 25 MG tablet      Multiple Vitamins-Iron (MULTIVITAMINS WITH IRON) TABS tablet      senna (SENOKOT) 8.6 MG TABS tablet      sertraline  (ZOLOFT) 25 MG tablet          DISCHARGE INSTRUCTIONS:    None.  If you experience worsening of your admission symptoms, develop shortness of breath, life threatening emergency, suicidal or homicidal thoughts you must seek medical attention immediately by calling 911 or calling your MD immediately  if symptoms less severe.  You Must read complete instructions/literature along with all the possible adverse reactions/side effects for all the Medicines you take and that have been prescribed to you. Take any new Medicines after you have completely understood and accept all the possible adverse reactions/side effects.   Please note  You were cared for by a hospitalist during your hospital stay. If you have any questions about your discharge medications or the care you received while you were in the hospital after you are discharged, you can call the unit and asked to speak with the hospitalist on call if the hospitalist that took care of you is not available. Once you are discharged, your primary care physician will handle any further medical issues. Please note that NO REFILLS for any discharge medications will be authorized once you are discharged, as it is imperative that you return to your primary care physician (or establish a relationship with a primary care physician if you do not have one) for your aftercare needs so that they can reassess your need for medications and monitor your lab values.    Today   CHIEF COMPLAINT:   Chief Complaint  Patient presents with  . Fever    HISTORY OF PRESENT ILLNESS:  Grisell Bissette  is a 81 y.o. female with a known history of Alzheimer's dementia, atrial fibrillation, hypothyroidism and hypertension [facility] secondary to altered mental status and fever this morning. Patient is completely altered, unable to provide any history at this time. Most of the history is obtained from old records and also son at  bedside. Patient was here about a month ago for sepsis and urinary tract infection, had Escherichia coli in urine and was discharged on Keflex at the time. According to son patient did well 2 days after discharge and was seen to be normal at her baseline even yesterday as well. This morning he was called to notify the patient is being sent to the hospital due to change in her mental status and also having a temperature of 10 49F. Workup here revealed again infected urine and she is being admitted for sepsis again. Also labs indicative of acute renal failure. At baseline with her dementia, patient is wheelchair-bound, total care, able to recognize family and answer simple questions. But cannot maintain a meaningful conversation  VITAL SIGNS:  Blood pressure 132/88, pulse 87, temperature 98.5 F (36.9 C), temperature source Oral, resp. rate 21, height 5\' 4"  (1.626 m), weight 63.821 kg (140 lb  11.2 oz), SpO2 91 %.  I/O:   Intake/Output Summary (Last 24 hours) at 05/12/15 1010 Last data filed at 05/12/15 0648  Gross per 24 hour  Intake   1050 ml  Output      0 ml  Net   1050 ml    PHYSICAL EXAMINATION:  GENERAL: 81 y.o.-year-old patient lying in the bed, lying in bed.Marland Kitchen  EYES: Pupils equal, round, post surgical, reactive to light and accommodation. No scleral icterus.  HEENT: Head atraumatic, normocephalic. Oropharynx and nasopharynx clear. Dry mucous membranes, mouth breathing. NECK: Supple, no jugular venous distention. No thyroid enlargement, no tenderness.  LUNGS: Normal breath sounds bilaterally, no wheezing, rales,rhonchi or crepitation. No use of accessory muscles of respiration.  CARDIOVASCULAR: S1, S2 normal. 3/6 systolic murmur heard, no rubs or gallops  ABDOMEN: Soft, nontender, nondistended. Bowel sounds present. No organomegaly or mass.  EXTREMITIES: No cyanosis, or clubbing. Plus pedal edema both lower extremities, also right hand edema present. Left arm is  normal. NEUROLOGIC: Unable to do complete neuro exam due to patient's altered mental status at this time. Withdraws to pain. Not following any simple commands.  PSYCHIATRIC: The patient is lethargic and not oriented.  SKIN: No obvious rash, lesion, or ulcer.   DATA REVIEW:   CBC  Recent Labs Lab 05/12/15 0416  WBC 9.9  HGB 14.4  HCT 44.0  PLT 44*    Chemistries   Recent Labs Lab 05/09/15 1001  05/12/15 0416  NA 142  < > 145  K 4.4  < > 2.9*  CL 103  < > 113*  CO2 27  < > 23  GLUCOSE 146*  < > 112*  BUN 34*  < > 31*  CREATININE 1.59*  < > 1.02*  CALCIUM 9.6  < > 8.3*  MG  --   --  2.0  AST 54*  --   --   ALT 19  --   --   ALKPHOS 78  --   --   BILITOT 1.0  --   --   < > = values in this interval not displayed.  Cardiac Enzymes  Recent Labs Lab 05/09/15 1001  TROPONINI 0.04*    Microbiology Results  Results for orders placed or performed during the hospital encounter of 05/09/15  Blood Culture (routine x 2)     Status: None   Collection Time: 05/09/15 10:02 AM  Result Value Ref Range Status   Specimen Description BLOOD  Final   Special Requests NONE  Final   Culture  Setup Time   Final    GRAM NEGATIVE RODS IN BOTH AEROBIC AND ANAEROBIC BOTTLES    Culture   Final    ESCHERICHIA COLI IN BOTH AEROBIC AND ANAEROBIC BOTTLES CRITICAL RESULT CALLED TO, READ BACK BY AND VERIFIED WITH: CHAROLETTE KYIE @ 2350 6.13.16 MPG CONFIRMED BY AJO    Report Status 05/11/2015 FINAL  Final   Organism ID, Bacteria ESCHERICHIA COLI  Final      Susceptibility   Escherichia coli - MIC*    AMPICILLIN <=2 SENSITIVE Sensitive     CEFTAZIDIME <=1 SENSITIVE Sensitive     CEFAZOLIN <=4 SENSITIVE Sensitive     CEFTRIAXONE <=1 SENSITIVE Sensitive     CIPROFLOXACIN <=0.25 SENSITIVE Sensitive     GENTAMICIN <=1 SENSITIVE Sensitive     IMIPENEM <=0.25 SENSITIVE Sensitive     TRIMETH/SULFA <=20 SENSITIVE Sensitive     CEFOXITIN <=4 SENSITIVE Sensitive     * ESCHERICHIA COLI   Blood  Culture (routine x 2)     Status: None   Collection Time: 05/09/15 10:02 AM  Result Value Ref Range Status   Specimen Description BLOOD  Final   Special Requests NONE  Final   Culture  Setup Time   Final    GRAM NEGATIVE RODS AEROBIC BOTTLE ONLY CRITICAL RESULT CALLED TO, READ BACK BY AND VERIFIED WITH: DENISE FREEMAN AT 1135 05/10/15 CTJ ANAEROBIC BOTTLE ONLY CRITICAL VALUE NOTED.  VALUE IS CONSISTENT WITH PREVIOUSLY REPORTED AND CALLED VALUE. GRAM NEGATIVE RODS    Culture   Final    ESCHERICHIA COLI IN BOTH AEROBIC AND ANAEROBIC BOTTLES    Report Status 05/12/2015 FINAL  Final   Organism ID, Bacteria ESCHERICHIA COLI  Final      Susceptibility   Escherichia coli - MIC*    AMPICILLIN 4 SENSITIVE Sensitive     CEFTAZIDIME <=1 SENSITIVE Sensitive     CEFAZOLIN <=4 SENSITIVE Sensitive     CEFTRIAXONE <=1 SENSITIVE Sensitive     CIPROFLOXACIN <=0.25 SENSITIVE Sensitive     GENTAMICIN <=1 SENSITIVE Sensitive     IMIPENEM <=0.25 SENSITIVE Sensitive     TRIMETH/SULFA <=20 SENSITIVE Sensitive     CEFOXITIN <=4 SENSITIVE Sensitive     PIP/TAZO Value in next row Sensitive      SENSITIVE<=4    * ESCHERICHIA COLI  MRSA PCR Screening     Status: Abnormal   Collection Time: 05/09/15  5:15 PM  Result Value Ref Range Status   MRSA by PCR POSITIVE (A) NEGATIVE Final    Comment:        The GeneXpert MRSA Assay (FDA approved for NASAL specimens only), is one component of a comprehensive MRSA colonization surveillance program. It is not intended to diagnose MRSA infection nor to guide or monitor treatment for MRSA infections. CRITICAL RESULT CALLED TO, READ BACK BY AND VERIFIED WITH: MATTY HIMES AT 1856 05/09/15.TSH      Management plans discussed with the patient, family and they are in agreement.  CODE STATUS:     Code Status Orders        Start     Ordered   05/09/15 1441  Do not attempt resuscitation (DNR)   Continuous    Question Answer Comment  In the event of  cardiac or respiratory ARREST Do not call a "code blue"   In the event of cardiac or respiratory ARREST Do not perform Intubation, CPR, defibrillation or ACLS   In the event of cardiac or respiratory ARREST Use medication by any route, position, wound care, and other measures to relive pain and suffering. May use oxygen, suction and manual treatment of airway obstruction as needed for comfort.      05/09/15 1440    Advance Directive Documentation        Most Recent Value   Type of Advance Directive  Out of facility DNR (pink MOST or yellow form)   Pre-existing out of facility DNR order (yellow form or pink MOST form)     "MOST" Form in Place?        TOTAL TIME TAKING CARE OF THIS PATIENT: 35 minutes.    Altamese Dilling M.D on 05/12/2015 at 10:10 AM  Between 7am to 6pm - Pager - [phone]  After 6pm go to [email or web address] - password [email or web address] [facility]  [place] [facility]  Office  [phone]  CC: Primary care physician; [name], MD

## 2015-05-12 NOTE — Progress Notes (Signed)
Notified Dr. Betti Cruz of K of 2.9. IV k ordered.

## 2015-05-12 NOTE — Progress Notes (Signed)
Order received to discharge pt to hospice home. Chaplain and hospice liaison spoke to pt. IV and tele removed. Pt escorted off unit via EMS to hospice

## 2015-05-12 NOTE — Telephone Encounter (Signed)
Faxed refill request for Levothyroxine 50 mcg.  Medication not on current meds list.  Is the patient even being given this medication any longer (Hospice pt)?  Please advise.

## 2015-05-12 NOTE — Consult Note (Signed)
Palliative Medicine Inpatient Consult Follow Up Note   Name: [name] Date: 05/12/2015 MRN: [id number]  DOB: November 09, 1922  Referring Physician: Altamese Dilling, MD  Palliative Care consult requested for this 81 y.o. female for goals of medical therapy in patient with dementia, admitted with UTI/sepsis  [name] is lying in bed, appears dyspneic. Brow furrowed. Opens eyes intermittently but no response to verbal stimuli. Son at bedside.    REVIEW OF SYSTEMS:  Patient is not able to provide ROS  CODE STATUS: DNR   PAST MEDICAL HISTORY: Past Medical History  Diagnosis Date  . Anxiety   . HTN (hypertension)   . Atrial fibrillation   . Dementia   . Tremor   . Osteoporosis     prev intolerant of alendronate  . Myocardial infarction   . Anemia   . Hypertension   . Closed left hip fracture   . Arthritis   . DEMENTIA   . Pneumonia   . Hypothyroid   . Hypothyroidism   . Depression     PAST SURGICAL HISTORY:  Past Surgical History  Procedure Laterality Date  . Head surgery      for trigeminal neuralgia  . Total hip arthroplasty      right  . Cataract extraction    . Appendectomy    . Cataract extraction, bilateral    . Rotator cuff repair      left  . Closed reduction humerus fracture      left    Vital Signs: BP 132/88 mmHg  Pulse 87  Temp(Src) 98.5 F (36.9 C) (Oral)  Resp 21  Ht  (1.626 m)  Wt 63.821 kg (140 lb 11.2 oz)  BMI 24.14 kg/m2  SpO2 91% Filed Weights   05/10/15 0423 05/11/15 0448 05/12/15 0330  Weight: 63.957 kg (141 lb) 62.596 kg (138 lb) 63.821 kg (140 lb 11.2 oz)    Estimated body mass index is 24.14 kg/(m^2) as calculated from the following:   Height as of this encounter:  (1.626 m).   Weight as of this encounter: 63.821 kg (140 lb 11.2 oz).  PHYSICAL EXAM: General: critically ill-appearing HEENT: OP clear, dry oral mucosa with coating on tongue Neck: Trachea midline  Cardiovascular: irregular rate and rhythm,  tachycardic Pulmonary/Chest: coarse BS's ant fields Abdominal: Soft, NTTP, hypoactive bowel sounds GU: No SP tenderness Extremities: No edema  Neurological: no response to verbal or tactile stimuli Skin: no rashes Psychiatric: unable to assess  LABS: CBC:    Component Value Date/Time   WBC 9.9 05/12/2015 0416   WBC 4.4 02/05/2014 1152   HGB 14.4 05/12/2015 0416   HGB 13.6 02/05/2014 1152   HCT 44.0 05/12/2015 0416   HCT 40.1 02/05/2014 1152   PLT 44* 05/12/2015 0416   PLT 109* 02/05/2014 1152   MCV 92.9 05/12/2015 0416   MCV 92 02/05/2014 1152   NEUTROABS 19.6* 05/09/2015 1001   NEUTROABS 2.5 02/05/2014 1152   LYMPHSABS 1.1 05/09/2015 1001   LYMPHSABS 1.4 02/05/2014 1152   MONOABS 1.9* 05/09/2015 1001   MONOABS 0.4 02/05/2014 1152   EOSABS 0.0 05/09/2015 1001   EOSABS 0.1 02/05/2014 1152   BASOSABS 0.0 05/09/2015 1001   BASOSABS 0.0 02/05/2014 1152   Comprehensive Metabolic Panel:    Component Value Date/Time   NA 145 05/12/2015 0416   NA 140 02/05/2014 1136   K 2.9* 05/12/2015 0416   K 4.1 02/05/2014 1136   CL 113* 05/12/2015 0416   CL 104 02/05/2014 1136  CO2 23 05/12/2015 0416   CO2 33* 02/05/2014 1136   BUN 31* 05/12/2015 0416   BUN 11 02/05/2014 1136   BUN 17 08/13/2013   CREATININE 1.02* 05/12/2015 0416   CREATININE 0.82 02/05/2014 1136   CREATININE 0.81 08/13/2013   GLUCOSE 112* 05/12/2015 0416   GLUCOSE 78 02/05/2014 1136   CALCIUM 8.3* 05/12/2015 0416   CALCIUM 9.0 02/05/2014 1136   CALCIUM 8.9 05/18/2012 0655   AST 54* 05/09/2015 1001   AST 21 11/30/2013 1317   ALT 19 05/09/2015 1001   ALT 25 11/30/2013 1317   ALKPHOS 78 05/09/2015 1001   ALKPHOS 89 11/30/2013 1317   BILITOT 1.0 05/09/2015 1001   PROT 7.7 05/09/2015 1001   PROT 7.5 11/30/2013 1317   ALBUMIN 3.8 05/09/2015 1001   ALBUMIN 3.4 11/30/2013 1317    IMPRESSION: [name] is a 81 yo woman with PMH of Alzheimer's dementia, HTN, CAD, a.fib, osteoporosis with h/o fragility fx's  (L.hip, L.arm), OA, hypothyroidism, depression, s/p appendectomy. She was just hospitalized 03/2015 with E.coli UTI. She was readmitted 05/09/15 with pansensitive E.coli UTI/bactermia. Pt is day # 4 IV abx with no improvement. Opens eyes intermittently but does not answer questions or follow commands.    I spoke with son at bedside. He understands that pt is approaching end of life. He has decided that he wants her care to focus on comfort. [facility] and son in agreement. [facility] liason notified. Orders entered. Chaplain to see pt's son.   Pt appears dyspneic. RN to give morphine. Son in agreement.   PLAN: 1. Morphine now for dyspnea/pain 2. Transfer to [facility]  REFERRALS TO BE ORDERED:  Hospice Chaplain   More than 50% of the visit was spent in counseling/coordination of care: YES  Time spent: 40 minutes

## 2015-05-13 ENCOUNTER — Encounter: Payer: Self-pay | Admitting: Family Medicine

## 2015-05-13 DIAGNOSIS — Z515 Encounter for palliative care: Secondary | ICD-10-CM | POA: Insufficient documentation

## 2015-05-13 NOTE — Telephone Encounter (Signed)
Faxed denial to pharmacy.

## 2015-05-13 NOTE — Telephone Encounter (Signed)
Med has been stopped. Thanks.

## 2015-05-16 ENCOUNTER — Telehealth: Payer: Self-pay

## 2015-05-16 NOTE — Telephone Encounter (Signed)
PLEASE NOTE: All timestamps contained within this report are represented as [place] Standard Time. CONFIDENTIALTY NOTICE: This fax transmission is intended only for the addressee. It contains information that is legally privileged, confidential or otherwise protected from use or disclosure. If you are not the intended recipient, you are strictly prohibited from reviewing, disclosing, copying using or disseminating any of this information or taking any action in reliance on or regarding this information. If you have received this fax in error, please notify us immediately by telephone so that we can arrange for its return to [place]. Phone: [phone], Toll-Free: [phone], Fax: [phone] Page: 1 of 1 Call Id: [id number] [facility] Night - Client TELEPHONE ADVICE RECORD [facility] Medical Call Center Patient Name: [name] Gender: Female DOB: [birth year] Age: 81 Y 10 M 8 D Return Phone Number: Address: City/State/Zip: [place] [facility] Night - Client Client Site [facility] [place] - Night Physician Raechel Ache Contact Type Call Call Type Page Only Caller Name Gevena Mart Relationship To Patient Care Giver Is this call to report lab results? No Return Phone Number Please choose phone number Initial Comment Gevena Mart from [facility] @ [phone] states she needs to notify doctor that patient passed away @ 1:18pm today Nurse Assessment Guidelines Guideline Title Affirmed Question Affirmed Notes Nurse Date/Time (Eastern Time) Disp. Time Lamount Cohen Time) Disposition Final User 2015-05-23 2:07:28 PM Send to [facility] Paging Queue Alfonse Alpers May 23, 2015 2:13:37 PM Paged On Call to Other Provider Gasper Sells 2015-05-23 2:14:03 PM Page Completed Yes Gasper Sells After Care Instructions Given Call Event Type User Date / Time Description Paging [facility] Phone DateTime Result/Outcome Message Type Notes Kerby Nora [phone] 2015/05/23 2:13:37 PM Paged On Call to Other Provider Doctor Paged [facility]: Please call Gevena Mart at [phone] regarding Martie Round. Kerby Nora 2015/05/23 2:13:45 PM Paged On Call to Another Provider Message Result

## 2015-05-16 NOTE — Telephone Encounter (Signed)
Called her son and offered condolences.  He thanked me for the call.  I was always glad to see her.   Please update the chart.  Thanks.

## 2015-05-27 DEATH — deceased
